# Patient Record
Sex: Female | Born: 1945
Health system: Southern US, Community
[De-identification: ages and names within clinical notes are randomized; demographics above are authoritative.]

## PROBLEM LIST (undated history)

## (undated) DIAGNOSIS — I10 Essential (primary) hypertension: Secondary | ICD-10-CM

## (undated) DIAGNOSIS — K219 Gastro-esophageal reflux disease without esophagitis: Secondary | ICD-10-CM

## (undated) DIAGNOSIS — E7800 Pure hypercholesterolemia, unspecified: Secondary | ICD-10-CM

## (undated) DIAGNOSIS — E079 Disorder of thyroid, unspecified: Secondary | ICD-10-CM

## (undated) DIAGNOSIS — E039 Hypothyroidism, unspecified: Secondary | ICD-10-CM

## (undated) HISTORY — PX: TUBAL LIGATION: SHX77

## (undated) HISTORY — PX: HEMORROIDECTOMY: SUR656

---

## 2004-07-28 ENCOUNTER — Ambulatory Visit (HOSPITAL_COMMUNITY): Admission: RE | Admit: 2004-07-28 | Discharge: 2004-07-28 | Payer: Self-pay | Admitting: Internal Medicine

## 2004-07-28 ENCOUNTER — Ambulatory Visit: Payer: Self-pay | Admitting: Internal Medicine

## 2004-07-28 HISTORY — PX: COLONOSCOPY: SHX174

## 2009-04-02 HISTORY — PX: COLONOSCOPY: SHX174

## 2010-01-12 ENCOUNTER — Telehealth (INDEPENDENT_AMBULATORY_CARE_PROVIDER_SITE_OTHER): Payer: Self-pay | Admitting: *Deleted

## 2010-01-13 ENCOUNTER — Encounter: Payer: Self-pay | Admitting: Internal Medicine

## 2010-01-23 ENCOUNTER — Ambulatory Visit: Payer: Self-pay | Admitting: Internal Medicine

## 2010-01-23 ENCOUNTER — Ambulatory Visit (HOSPITAL_COMMUNITY): Admission: RE | Admit: 2010-01-23 | Discharge: 2010-01-23 | Payer: Self-pay | Admitting: Internal Medicine

## 2010-05-02 NOTE — Progress Notes (Signed)
  Phone Note Call from Patient   Reason for Call: Talk to Nurse Summary of Call: pt had LMOM that she was returning a phone call from earlier. you can reach her at [phone] Initial call taken by: Diana Eves,  January 12, 2010 1:22 PM     Appended Document:  Reviewed meds with pt. No new meds and no new problems. pt is scheduled for 01/23/2010 for  her colonoscopy.

## 2010-05-02 NOTE — Letter (Signed)
Summary: TRIAGE ORDER  TRIAGE ORDER   Imported By: Ave Filter 01/13/2010 10:55:05  _____________________________________________________________________  External Attachment:    Type:   Image     Comment:   External Document

## 2010-08-18 NOTE — Op Note (Signed)
NAMEADALY, Tabitha Fields           ACCOUNT NO.:  [id number]   MEDICAL RECORD NO.:  [id number]          PATIENT TYPE:  AMB   LOCATION:  DAY                           FACILITY:  [facility]   PHYSICIAN:  R. Roetta Sessions, M.D. DATE OF BIRTH:  04-22-1945   DATE OF PROCEDURE:  07/28/2004  DATE OF DISCHARGE:                                 OPERATIVE REPORT   PROCEDURE:  Colonoscopy with biopsy.   INDICATIONS FOR PROCEDURE:  The patient is a 65 year old lady referred at  the courtesy of Dr. Ernestina Penna in [place] for colorectal cancer screening. She  is devoid of any lower GI tract symptoms. She has never had a colonoscopy,  and there is no family history of colon cancer. Colonoscopy is now being  done as a screening maneuver. This approach has been discussed with the  patient at length. Potential risks, benefits, and alternatives have been  reviewed and questions answered. She is agreeable. Please see documentation  in the medical record.   PROCEDURE NOTE:  O2 saturation, blood pressure, pulse, and respirations were  monitored throughout the entire procedure. Conscious sedation with Versed 2  mg IV and Demerol 50 mg IV.   INSTRUMENT:  Olympus video chip system.   FINDINGS:  Digital exam revealed no abnormalities.   ENDOSCOPIC FINDINGS:  Prep was adequate.   Rectum:  Examination of the rectal mucosa including retroflexed view of the  anal verge revealed no abnormalities.   Colon:  Colonic mucosa was surveyed from the rectosigmoid junction through  the left, transverse, and right colon to the area of the appendiceal  orifice, ileocecal valve, and cecum. These structures were well seen and  photographed for the record. Olympus videoscope was slowly withdrawn. All  previously mentioned mucosal surfaces were again. The patient was noted to  have a diminutive 4-mm polyp on a fold at the splenic flexure. It was cold  biopsied/removed. The remainder of the colonic mucosa appeared normal. The  patient tolerated the procedure well and was reactive to endoscopy.   IMPRESSION:  Normal rectum. Diminutive polyp at the splenic flexure, cold  biopsied/removed. The remainder of the colonic mucosa appeared normal.   RECOMMENDATIONS:  1.  Followup on pathology.  2.  Further recommendations to follow.      RMR/MEDQ  D:  07/28/2004  T:  07/28/2004  Job:  [id number]

## 2010-09-27 ENCOUNTER — Other Ambulatory Visit (HOSPITAL_COMMUNITY): Payer: Self-pay | Admitting: Internal Medicine

## 2010-09-27 DIAGNOSIS — Z139 Encounter for screening, unspecified: Secondary | ICD-10-CM

## 2010-10-02 ENCOUNTER — Ambulatory Visit (HOSPITAL_COMMUNITY)
Admission: RE | Admit: 2010-10-02 | Discharge: 2010-10-02 | Disposition: A | Discharge status: Home / Self Care | Payer: Medicare Other | Source: Ambulatory Visit | Attending: Internal Medicine | Admitting: Internal Medicine

## 2010-10-02 DIAGNOSIS — Z1231 Encounter for screening mammogram for malignant neoplasm of breast: Secondary | ICD-10-CM | POA: Insufficient documentation

## 2010-10-02 DIAGNOSIS — Z139 Encounter for screening, unspecified: Secondary | ICD-10-CM

## 2010-10-11 ENCOUNTER — Other Ambulatory Visit: Payer: Self-pay | Admitting: Internal Medicine

## 2010-10-11 DIAGNOSIS — R928 Other abnormal and inconclusive findings on diagnostic imaging of breast: Secondary | ICD-10-CM

## 2010-10-25 ENCOUNTER — Ambulatory Visit (HOSPITAL_COMMUNITY)
Admission: RE | Admit: 2010-10-25 | Discharge: 2010-10-25 | Disposition: A | Discharge status: Home / Self Care | Payer: Medicare Other | Source: Ambulatory Visit | Attending: Internal Medicine | Admitting: Internal Medicine

## 2010-10-25 ENCOUNTER — Other Ambulatory Visit (HOSPITAL_COMMUNITY): Payer: Self-pay | Admitting: Internal Medicine

## 2010-10-25 DIAGNOSIS — R928 Other abnormal and inconclusive findings on diagnostic imaging of breast: Secondary | ICD-10-CM

## 2011-06-27 ENCOUNTER — Other Ambulatory Visit (HOSPITAL_COMMUNITY): Payer: Self-pay | Admitting: Family Medicine

## 2011-06-27 DIAGNOSIS — Z139 Encounter for screening, unspecified: Secondary | ICD-10-CM

## 2011-07-03 ENCOUNTER — Other Ambulatory Visit (HOSPITAL_COMMUNITY): Payer: Medicare Other

## 2011-07-12 ENCOUNTER — Other Ambulatory Visit (HOSPITAL_COMMUNITY): Payer: Medicare Other

## 2011-07-12 ENCOUNTER — Other Ambulatory Visit (HOSPITAL_COMMUNITY): Payer: Self-pay | Admitting: Family Medicine

## 2011-07-12 DIAGNOSIS — Z139 Encounter for screening, unspecified: Secondary | ICD-10-CM

## 2011-07-13 ENCOUNTER — Ambulatory Visit (HOSPITAL_COMMUNITY)
Admission: RE | Admit: 2011-07-13 | Discharge: 2011-07-13 | Disposition: A | Discharge status: Home / Self Care | Payer: Medicare Other | Source: Ambulatory Visit | Attending: Family Medicine | Admitting: Family Medicine

## 2011-07-13 DIAGNOSIS — Z13820 Encounter for screening for osteoporosis: Secondary | ICD-10-CM | POA: Insufficient documentation

## 2011-07-13 DIAGNOSIS — M899 Disorder of bone, unspecified: Secondary | ICD-10-CM | POA: Insufficient documentation

## 2011-07-13 DIAGNOSIS — M949 Disorder of cartilage, unspecified: Secondary | ICD-10-CM | POA: Insufficient documentation

## 2011-07-13 DIAGNOSIS — Z139 Encounter for screening, unspecified: Secondary | ICD-10-CM

## 2011-07-13 DIAGNOSIS — Z780 Asymptomatic menopausal state: Secondary | ICD-10-CM | POA: Insufficient documentation

## 2011-10-11 ENCOUNTER — Other Ambulatory Visit (HOSPITAL_COMMUNITY): Payer: Self-pay | Admitting: Family Medicine

## 2011-10-11 DIAGNOSIS — Z139 Encounter for screening, unspecified: Secondary | ICD-10-CM

## 2011-10-18 ENCOUNTER — Ambulatory Visit (HOSPITAL_COMMUNITY)
Admission: RE | Admit: 2011-10-18 | Discharge: 2011-10-18 | Disposition: A | Discharge status: Home / Self Care | Payer: Medicare Other | Source: Ambulatory Visit | Attending: Family Medicine | Admitting: Family Medicine

## 2011-10-18 DIAGNOSIS — Z1231 Encounter for screening mammogram for malignant neoplasm of breast: Secondary | ICD-10-CM | POA: Insufficient documentation

## 2011-10-18 DIAGNOSIS — Z139 Encounter for screening, unspecified: Secondary | ICD-10-CM

## 2012-08-21 ENCOUNTER — Other Ambulatory Visit (HOSPITAL_COMMUNITY): Payer: Self-pay | Admitting: Family Medicine

## 2012-08-21 DIAGNOSIS — M8580 Other specified disorders of bone density and structure, unspecified site: Secondary | ICD-10-CM

## 2012-09-01 ENCOUNTER — Ambulatory Visit (HOSPITAL_COMMUNITY)
Admission: RE | Admit: 2012-09-01 | Discharge: 2012-09-01 | Disposition: A | Discharge status: Home / Self Care | Payer: Medicare Other | Source: Ambulatory Visit | Attending: Family Medicine | Admitting: Family Medicine

## 2012-09-01 DIAGNOSIS — M8580 Other specified disorders of bone density and structure, unspecified site: Secondary | ICD-10-CM

## 2012-09-01 DIAGNOSIS — M949 Disorder of cartilage, unspecified: Secondary | ICD-10-CM | POA: Insufficient documentation

## 2012-09-01 DIAGNOSIS — M899 Disorder of bone, unspecified: Secondary | ICD-10-CM | POA: Insufficient documentation

## 2012-10-09 ENCOUNTER — Other Ambulatory Visit (HOSPITAL_COMMUNITY): Payer: Self-pay | Admitting: Family Medicine

## 2012-10-09 DIAGNOSIS — Z139 Encounter for screening, unspecified: Secondary | ICD-10-CM

## 2012-10-20 ENCOUNTER — Ambulatory Visit (HOSPITAL_COMMUNITY)
Admission: RE | Admit: 2012-10-20 | Discharge: 2012-10-20 | Disposition: A | Discharge status: Home / Self Care | Payer: Medicare Other | Source: Ambulatory Visit | Attending: Family Medicine | Admitting: Family Medicine

## 2012-10-20 DIAGNOSIS — Z139 Encounter for screening, unspecified: Secondary | ICD-10-CM

## 2012-10-20 DIAGNOSIS — Z1231 Encounter for screening mammogram for malignant neoplasm of breast: Secondary | ICD-10-CM | POA: Insufficient documentation

## 2013-10-08 ENCOUNTER — Other Ambulatory Visit (HOSPITAL_COMMUNITY): Payer: Self-pay | Admitting: Family Medicine

## 2013-10-08 DIAGNOSIS — Z1231 Encounter for screening mammogram for malignant neoplasm of breast: Secondary | ICD-10-CM

## 2013-10-22 ENCOUNTER — Ambulatory Visit (HOSPITAL_COMMUNITY): Payer: Medicare Other

## 2013-10-26 ENCOUNTER — Ambulatory Visit (HOSPITAL_COMMUNITY)
Admission: RE | Admit: 2013-10-26 | Discharge: 2013-10-26 | Disposition: A | Discharge status: Home / Self Care | Payer: Medicare Other | Source: Ambulatory Visit | Attending: Family Medicine | Admitting: Family Medicine

## 2013-10-26 DIAGNOSIS — Z1231 Encounter for screening mammogram for malignant neoplasm of breast: Secondary | ICD-10-CM | POA: Diagnosis not present

## 2014-06-09 ENCOUNTER — Emergency Department (HOSPITAL_COMMUNITY)
Admission: EM | Admit: 2014-06-09 | Discharge: 2014-06-09 | Disposition: A | Discharge status: Home / Self Care | Payer: Medicare Other | Attending: Emergency Medicine | Admitting: Emergency Medicine

## 2014-06-09 ENCOUNTER — Emergency Department (HOSPITAL_COMMUNITY): Payer: Medicare Other

## 2014-06-09 ENCOUNTER — Encounter (HOSPITAL_COMMUNITY): Payer: Self-pay

## 2014-06-09 DIAGNOSIS — Z8639 Personal history of other endocrine, nutritional and metabolic disease: Secondary | ICD-10-CM | POA: Insufficient documentation

## 2014-06-09 DIAGNOSIS — Z8719 Personal history of other diseases of the digestive system: Secondary | ICD-10-CM | POA: Diagnosis not present

## 2014-06-09 DIAGNOSIS — M1712 Unilateral primary osteoarthritis, left knee: Secondary | ICD-10-CM | POA: Insufficient documentation

## 2014-06-09 DIAGNOSIS — M25562 Pain in left knee: Secondary | ICD-10-CM | POA: Diagnosis present

## 2014-06-09 HISTORY — DX: Gastro-esophageal reflux disease without esophagitis: K21.9

## 2014-06-09 HISTORY — DX: Disorder of thyroid, unspecified: E07.9

## 2014-06-09 HISTORY — DX: Pure hypercholesterolemia, unspecified: E78.00

## 2014-06-09 MED ORDER — HYDROCODONE-ACETAMINOPHEN 5-325 MG PO TABS: ORAL_TABLET | ORAL | Status: DC

## 2014-06-09 MED ORDER — CELECOXIB 100 MG PO CAPS: 100.0000 mg | ORAL_CAPSULE | Freq: Two times a day (BID) | ORAL | Status: DC

## 2014-06-09 MED ORDER — HYDROCODONE-ACETAMINOPHEN 5-325 MG PO TABS
1.0000 | ORAL_TABLET | Freq: Once | ORAL | Status: AC
  Administered 2014-06-09: 1 via ORAL
  Filled 2014-06-09: qty 1

## 2014-06-09 MED ORDER — NAPROXEN 250 MG PO TABS
500.0000 mg | ORAL_TABLET | Freq: Once | ORAL | Status: AC
  Administered 2014-06-09: 500 mg via ORAL
  Filled 2014-06-09: qty 2

## 2014-06-09 NOTE — ED Notes (Addendum)
PA at bedside.

## 2014-06-09 NOTE — Discharge Instructions (Signed)
Your xray reveals extensive degenerative changes (arthritis) of the left knee. Please call Dr Romeo AppleHarrison for appointment in the office to discuss this. Please use celebrex 2 times daily it a meal. Use norco at bed time, or every 6 hours if need for pain. This medication may cause drowsiness and lightheadedness, please use with caution. Please use a walker when up and about until seen by orthopedic MD. Heating pad may be helpful. Arthritis, Nonspecific Arthritis is pain, redness, warmth, or puffiness (inflammation) of a joint. The joint may be stiff or hurt when you move it. One or more joints may be affected. There are many types of arthritis. Your doctor may not know what type you have right away. The most common cause of arthritis is wear and tear on the joint (osteoarthritis). HOME CARE   Only take medicine as told by your doctor.  Rest the joint as much as possible.  Raise (elevate) your joint if it is puffy.  Use crutches if the painful joint is in your leg.  Drink enough fluids to keep your pee (urine) clear or pale yellow.  Follow your doctor's diet instructions.  Use cold packs for very bad joint pain for 10 to 15 minutes every hour. Ask your doctor if it is okay for you to use hot packs.  Exercise as told by your doctor.  Take a warm shower if you have stiffness in the morning.  Move your sore joints throughout the day. GET HELP RIGHT AWAY IF:   You have a fever.  You have very bad joint pain, puffiness, or redness.  You have many joints that are painful and puffy.  You are not getting better with treatment.  You have very bad back pain or leg weakness.  You cannot control when you poop (bowel movement) or pee (urinate).  You do not feel better in 24 hours or are getting worse.  You are having side effects from your medicine. MAKE SURE YOU:   Understand these instructions.  Will watch your condition.  Will get help right away if you are not doing well or get  worse. Document Released: 06/13/2009 Document Revised: 09/18/2011 Document Reviewed: 06/13/2009 [facility] Patient Information 2015 [place], [place]. This information is not intended to replace advice given to you by your health care provider. Make sure you discuss any questions you have with your health care provider.

## 2014-06-09 NOTE — ED Notes (Signed)
Left knee pain that began yesterday. Denies any injury. Describes pain as " bone grinding against bone" difficult to ambulate.

## 2014-06-09 NOTE — ED Provider Notes (Signed)
CSN: [id number]     Arrival date & time 06/09/14  0807 History   First MD Initiated Contact with Patient 06/09/14 [phone]     Chief Complaint  Patient presents with  . Knee Pain     (Consider location/radiation/quality/duration/timing/severity/associated sxs/prior Treatment) Patient is a 69 y.o. female presenting with knee pain. The history is provided by the patient.  Knee Pain Location:  Knee Time since incident:  2 days Injury: no   Knee location:  L knee Pain details:    Quality:  Aching (grinding pain)   Radiates to:  Does not radiate   Severity:  Moderate   Onset quality:  Gradual   Timing:  Intermittent   Progression:  Worsening Chronicity: acute on chronic pain. Dislocation: no   Foreign body present:  No foreign bodies Prior injury to area:  No Relieved by:  Nothing Worsened by:  Bearing weight and extension Ineffective treatments:  Acetaminophen Associated symptoms: stiffness   Associated symptoms: no back pain, no fever, no muscle weakness, no neck pain, no numbness and no tingling   Risk factors: no frequent fractures     Past Medical History  Diagnosis Date  . Hypercholesteremia   . Thyroid disease   . GERD (gastroesophageal reflux disease)    Past Surgical History  Procedure Laterality Date  . Hemorroidectomy     No family history on file. History  Substance Use Topics  . Smoking status: Never Smoker   . Smokeless tobacco: Not on file  . Alcohol Use: No   OB History    No data available     Review of Systems  Constitutional: Negative for fever and activity change.       All ROS Neg except as noted in HPI  HENT: Negative.   Eyes: Negative for photophobia and discharge.  Respiratory: Negative for cough, shortness of breath and wheezing.   Cardiovascular: Negative for chest pain and palpitations.  Gastrointestinal: Negative for abdominal pain and blood in stool.  Genitourinary: Negative for dysuria, frequency and hematuria.  Musculoskeletal:  Positive for stiffness. Negative for back pain, arthralgias and neck pain.  Skin: Negative.   Neurological: Negative for dizziness, seizures and speech difficulty.  Psychiatric/Behavioral: Negative for hallucinations and confusion.      Allergies  Review of patient's allergies indicates no known allergies.  Home Medications   Prior to Admission medications   Not on File   BP 179/88 mmHg  Pulse 95  Temp(Src) 98.1 F (36.7 C) (Oral)  Resp 16  Ht 5\' 2"  (1.575 m)  Wt 150 lb (68.04 kg)  BMI 27.43 kg/m2  SpO2 95% Physical Exam  Constitutional: She is oriented to person, place, and time. She appears well-developed and well-nourished.  Non-toxic appearance.  HENT:  Head: Normocephalic.  Right Ear: Tympanic membrane and external ear normal.  Left Ear: Tympanic membrane and external ear normal.  Eyes: EOM and lids are normal. Pupils are equal, round, and reactive to light.  Neck: Normal range of motion. Neck supple. Carotid bruit is not present.  Cardiovascular: Normal rate, regular rhythm, normal heart sounds, intact distal pulses and normal pulses.   Pulmonary/Chest: Breath sounds normal. No respiratory distress.  Abdominal: Soft. Bowel sounds are normal. There is no tenderness. There is no guarding.  Musculoskeletal: Normal range of motion.  Lymphadenopathy:       Head (right side): No submandibular adenopathy present.       Head (left side): No submandibular adenopathy present.    She has no cervical adenopathy.  Neurological: She is alert and oriented to person, place, and time. She has normal strength. No cranial nerve deficit or sensory deficit.  Skin: Skin is warm and dry.  Psychiatric: She has a normal mood and affect. Her speech is normal.  Nursing note and vitals reviewed.   ED Course  Procedures (including critical care time) Labs Review Labs Reviewed - No data to display  Imaging Review No results found.   EKG Interpretation None      MDM Xray reveals  multiple areas of arthritis of the left knee. No hot joints. Pt to use a walker until seen by orthopedic MD. Pt to Use celebrex and norco for pain.   Final diagnoses:  Primary osteoarthritis of left knee    **I have reviewed nursing notes, vital signs, and all appropriate lab and imaging results for this patient.Ivery Quale, PA-C 06/09/14 1038  Bethann Berkshire, MD 06/09/14 [phone]

## 2014-06-14 ENCOUNTER — Encounter: Payer: Self-pay | Admitting: Orthopedic Surgery

## 2014-06-14 ENCOUNTER — Ambulatory Visit (INDEPENDENT_AMBULATORY_CARE_PROVIDER_SITE_OTHER): Payer: Medicare Other | Admitting: Orthopedic Surgery

## 2014-06-14 DIAGNOSIS — S83242A Other tear of medial meniscus, current injury, left knee, initial encounter: Secondary | ICD-10-CM

## 2014-06-14 MED ORDER — CELECOXIB 100 MG PO CAPS: 100.0000 mg | ORAL_CAPSULE | Freq: Two times a day (BID) | ORAL | Status: DC

## 2014-06-14 MED ORDER — HYDROCODONE-ACETAMINOPHEN 5-325 MG PO TABS: ORAL_TABLET | ORAL | Status: DC

## 2014-06-14 NOTE — Patient Instructions (Signed)
We will schedule MRI for you 

## 2014-06-14 NOTE — Progress Notes (Signed)
Patient ID: Tabitha Fields, female   DOB: 29-Nov-1945, 69 y.o.   MRN: [id number]  Chief Complaint  Patient presents with  . Knee Pain    er follow up Left knee pain, no known injury x 7 days      Tabitha Fields is a 69 y.o. female.   HPI 69 year old active female presents with a seven-day history of acute onset medial knee pain requiring her to go to the emergency room. She reports anterior and medial knee pain aching sharp at times and initially. The pain continues and is constant and is worse with attempts at weightbearing. The patient has no confidence in her left knee and feels like it is giving out on her when she tries to put pressure on it. She also has some swelling and grinding sensation.  She denies neurologic symptoms or warmth of the joint Review of Systems As above  Past Medical History  Diagnosis Date  . Hypercholesteremia   . Thyroid disease   . GERD (gastroesophageal reflux disease)     Past Surgical History  Procedure Laterality Date  . Hemorroidectomy      History reviewed. No pertinent family history.  Social History History  Substance Use Topics  . Smoking status: Never Smoker   . Smokeless tobacco: Not on file  . Alcohol Use: No    No Known Allergies  Current Outpatient Prescriptions  Medication Sig Dispense Refill  . celecoxib (CELEBREX) 100 MG capsule Take 1 capsule (100 mg total) by mouth 2 (two) times daily. 60 capsule 0  . HYDROcodone-acetaminophen (NORCO/VICODIN) 5-325 MG per tablet One at hs, or q6h prn pain 60 tablet 0   No current facility-administered medications for this visit.       Physical Exam Blood pressure 156/93, height 5\' 2"  (1.575 m), weight 150 lb (68.04 kg). Physical Exam The patient is well developed well nourished and well groomed. Orientation to person place and time is normal  Mood is pleasant. Ambulatory status she is requiring a walker and she is limping she is favoring the involved left knee. Right knee  examination inspection and palpation revealed no abnormalities, she has full range of motion without pain. The knee is stable and the strength in the right leg is normal.  The left knee does not have effusion but is noted to be tender over the medial joint line and she does have painful range of motion at 120 of flexion up to 135 of flexion, no instability is detected. Muscle tone and strength are normal and the quadriceps. She has a positive McMurray sign and a positive screw home sign for pain over the medial joint line  Skin over the lower extremities is normal. Vascular exam reveals no varicosities or swelling in either limb and she has good pulses and temperature is normal  Data Reviewed Independent interpretation of her knee x-ray shows that she does have 50% loss of joint space on the medial side consistent with osteoarthritis but does not explain her symptoms  Assessment Agnosias includes acute stress fracture versus AVN of the medial femoral condyle or medial tibial plateau, stress reaction related to degenerative arthritis involving the medial compartment, medial meniscal tear, acute exacerbation of arthritis   Plan She did get relief from Celebrex and hydrocodone we will continue Because for mechanical symptoms recommend MRI to assess the need for surgical intervention Hinged knee brace wrap on style.

## 2014-06-24 ENCOUNTER — Ambulatory Visit (HOSPITAL_COMMUNITY)
Admission: RE | Admit: 2014-06-24 | Discharge: 2014-06-24 | Disposition: A | Discharge status: Home / Self Care | Payer: Medicare Other | Source: Ambulatory Visit | Attending: Orthopedic Surgery | Admitting: Orthopedic Surgery

## 2014-06-24 DIAGNOSIS — M1712 Unilateral primary osteoarthritis, left knee: Secondary | ICD-10-CM | POA: Diagnosis not present

## 2014-06-24 DIAGNOSIS — X58XXXA Exposure to other specified factors, initial encounter: Secondary | ICD-10-CM | POA: Insufficient documentation

## 2014-06-24 DIAGNOSIS — S83242A Other tear of medial meniscus, current injury, left knee, initial encounter: Secondary | ICD-10-CM | POA: Insufficient documentation

## 2014-07-02 ENCOUNTER — Telehealth: Payer: Self-pay | Admitting: Orthopedic Surgery

## 2014-07-02 NOTE — Telephone Encounter (Signed)
Patient called to follow up regarding MRI results, which she had at [facility].  Relayed to patient that Dr Romeo AppleHarrison has been out of office since that time and will contact her upon his return to office. Patient ph# [phone]

## 2014-07-14 ENCOUNTER — Telehealth: Payer: Self-pay | Admitting: *Deleted

## 2014-07-14 NOTE — Telephone Encounter (Signed)
Requesting refill on Celebrex

## 2014-07-15 ENCOUNTER — Other Ambulatory Visit: Payer: Self-pay | Admitting: *Deleted

## 2014-07-15 MED ORDER — CELECOXIB 100 MG PO CAPS: 100.0000 mg | ORAL_CAPSULE | Freq: Two times a day (BID) | ORAL | Status: DC

## 2014-07-15 NOTE — Telephone Encounter (Signed)
refill 

## 2014-07-15 NOTE — Telephone Encounter (Signed)
done

## 2014-08-02 ENCOUNTER — Encounter: Payer: Self-pay | Admitting: Orthopedic Surgery

## 2014-08-02 ENCOUNTER — Ambulatory Visit (INDEPENDENT_AMBULATORY_CARE_PROVIDER_SITE_OTHER): Payer: Medicare Other | Admitting: Orthopedic Surgery

## 2014-08-02 VITALS — BP 169/95 | Ht 62.0 in | Wt 150.0 lb

## 2014-08-02 DIAGNOSIS — M1712 Unilateral primary osteoarthritis, left knee: Secondary | ICD-10-CM

## 2014-08-02 NOTE — Progress Notes (Signed)
Follow-up visit for injection  Chief Complaint  Patient presents with  . Injections    monovisc #1 left knee    The patient been evaluated and found to have osteoarthritis of the left knee and has decided to go with monovisc  Site confirmation confirmed verbal consent given. Left knee site of injection. We injected 1 vial of monovisc into the left knee. We used alcohol for skin prep and ethyl chloride for anesthesia. The patient tolerated the procedure well without complication will follow-up in 6 week to check on her progress

## 2014-08-02 NOTE — Patient Instructions (Signed)
Apply ice as needed  You may have some pain and swelling for 48 hrs  Tale 2 tylenol every 8 hours if you do

## 2014-09-13 ENCOUNTER — Ambulatory Visit (INDEPENDENT_AMBULATORY_CARE_PROVIDER_SITE_OTHER): Payer: Medicare Other | Admitting: Orthopedic Surgery

## 2014-09-13 VITALS — BP 172/99 | Ht 62.0 in | Wt 151.4 lb

## 2014-09-13 DIAGNOSIS — M1712 Unilateral primary osteoarthritis, left knee: Secondary | ICD-10-CM

## 2014-09-13 NOTE — Progress Notes (Signed)
Follow-up visit status post monovisc injection. Patient has left knee osteoarthritis. She's had an x-ray which shows 50% loss of joint space. She is on intermittent Celebrex and is also been on hydrocodone. She had an MRI which showed osteoarthritis but no tear. The mono vascular has a improved her symptoms significantly. She has no limp  Review of systems no catching locking or giving way  Examination shows stable vital signs. She is awake alert and oriented 3 her overall appearance is normal her body habitus is mesomorphic.  She has no swelling in the knee her knee extension is 0 her flexion is 120 and her knee is stable. Motor exam is normal and quadriceps muscle tone is normal.  Stable osteoarthritis of the left knee controlled with intermittent Celebrex  Follow-up when symptomatic.

## 2014-10-01 ENCOUNTER — Other Ambulatory Visit (HOSPITAL_COMMUNITY): Payer: Self-pay | Admitting: Family Medicine

## 2014-10-01 DIAGNOSIS — Z1231 Encounter for screening mammogram for malignant neoplasm of breast: Secondary | ICD-10-CM

## 2014-10-07 ENCOUNTER — Other Ambulatory Visit (HOSPITAL_COMMUNITY): Payer: Self-pay | Admitting: Family Medicine

## 2014-10-07 ENCOUNTER — Ambulatory Visit (HOSPITAL_COMMUNITY)
Admission: RE | Admit: 2014-10-07 | Discharge: 2014-10-07 | Disposition: A | Discharge status: Home / Self Care | Payer: Medicare Other | Source: Ambulatory Visit | Attending: Family Medicine | Admitting: Family Medicine

## 2014-10-07 DIAGNOSIS — R221 Localized swelling, mass and lump, neck: Principal | ICD-10-CM

## 2014-10-07 DIAGNOSIS — R220 Localized swelling, mass and lump, head: Secondary | ICD-10-CM

## 2014-10-07 DIAGNOSIS — Z1231 Encounter for screening mammogram for malignant neoplasm of breast: Secondary | ICD-10-CM

## 2014-10-18 ENCOUNTER — Other Ambulatory Visit (HOSPITAL_COMMUNITY): Payer: Medicare Other

## 2014-10-20 ENCOUNTER — Ambulatory Visit (HOSPITAL_COMMUNITY)
Admission: RE | Admit: 2014-10-20 | Discharge: 2014-10-20 | Disposition: A | Discharge status: Home / Self Care | Payer: Medicare Other | Source: Ambulatory Visit | Attending: Family Medicine | Admitting: Family Medicine

## 2014-10-20 DIAGNOSIS — R1310 Dysphagia, unspecified: Secondary | ICD-10-CM | POA: Insufficient documentation

## 2014-10-20 DIAGNOSIS — R05 Cough: Secondary | ICD-10-CM | POA: Insufficient documentation

## 2014-10-20 DIAGNOSIS — R221 Localized swelling, mass and lump, neck: Secondary | ICD-10-CM | POA: Diagnosis not present

## 2014-10-20 DIAGNOSIS — R220 Localized swelling, mass and lump, head: Secondary | ICD-10-CM

## 2014-10-29 ENCOUNTER — Ambulatory Visit (HOSPITAL_COMMUNITY)
Admission: RE | Admit: 2014-10-29 | Discharge: 2014-10-29 | Disposition: A | Discharge status: Home / Self Care | Payer: Medicare Other | Source: Ambulatory Visit | Attending: Family Medicine | Admitting: Family Medicine

## 2014-10-29 DIAGNOSIS — Z1231 Encounter for screening mammogram for malignant neoplasm of breast: Secondary | ICD-10-CM | POA: Diagnosis present

## 2014-12-20 ENCOUNTER — Encounter: Payer: Self-pay | Admitting: Internal Medicine

## 2015-03-04 ENCOUNTER — Ambulatory Visit (INDEPENDENT_AMBULATORY_CARE_PROVIDER_SITE_OTHER): Payer: Medicare Other | Admitting: Gastroenterology

## 2015-03-04 ENCOUNTER — Encounter: Payer: Self-pay | Admitting: Gastroenterology

## 2015-03-04 ENCOUNTER — Other Ambulatory Visit: Payer: Self-pay

## 2015-03-04 VITALS — BP 186/86 | HR 76 | Temp 98.4°F | Ht 62.0 in | Wt 152.6 lb

## 2015-03-04 DIAGNOSIS — Z86010 Personal history of colonic polyps: Secondary | ICD-10-CM

## 2015-03-04 MED ORDER — PEG 3350-KCL-NA BICARB-NACL 420 G PO SOLR: 4000.0000 mL | Freq: Once | ORAL | Status: DC

## 2015-03-04 NOTE — Patient Instructions (Signed)
We have scheduled you for a colonoscopy with Dr. Rourk in the near future!  Have a great Christmas!   

## 2015-03-13 ENCOUNTER — Encounter: Payer: Self-pay | Admitting: Gastroenterology

## 2015-03-13 NOTE — Assessment & Plan Note (Signed)
69 year old female with history of colonic adenoma in the remote past, due for 5 year surveillance now. She has no concerning lower or upper GI symptoms.   Proceed with TCS with Dr. Jena Gaussourk in near future: the risks, benefits, and alternatives have been discussed with the patient in detail. The patient states understanding and desires to proceed.

## 2015-03-13 NOTE — Progress Notes (Signed)
  Primary Care Physician:  JACKSON,SAMANTHA, PA-C Primary Gastroenterologist:  Dr. Rourk   Chief Complaint  Patient presents with  . Colonoscopy    HPI:   Tabitha Fields is a 69 y.o. female presenting today with history of colonic adenomas in the past, last colonoscopy in 2011 without polyps, now with need for 5 year surveillance.   Denies abdominal pain, changes in bowel habits, or evidence of rectal bleeding. Takes Nexium for GERD, which controls symptoms. No dysphagia. No GI complaints today.   Past Medical History  Diagnosis Date  . Hypercholesteremia   . Thyroid disease   . GERD (gastroesophageal reflux disease)     Past Surgical History  Procedure Laterality Date  . Hemorroidectomy    . Colonoscopy  07/28/04    Dr. Rourkadenoma  . Colonoscopy  2011    Dr. Rourk: anal canal hemorhoids, pancolonic diverticula, otherwise normal  . Tubal ligation      Current Outpatient Prescriptions  Medication Sig Dispense Refill  . calcium carbonate 1250 MG capsule Take 1,250 mg by mouth 2 (two) times daily with a meal.    . Coenzyme Q10 (CO Q 10) 100 MG CAPS Take 200 mg by mouth daily.    . Esomeprazole Magnesium (NEXIUM PO) Take 22.3 mg by mouth daily.     . GREEN TEA, CAMILLIA SINENSIS, PO Take 730 mg by mouth daily.    . LEVOTHYROXINE SODIUM PO Take 75 mcg by mouth daily.     . Multiple Vitamin (MULTIVITAMIN) capsule Take 1 capsule by mouth daily.    . Red Yeast Rice 600 MG CAPS Take 600 mg by mouth daily.    . SIMVASTATIN PO Take 25 mg by mouth daily.     . celecoxib (CELEBREX) 100 MG capsule Take 1 capsule (100 mg total) by mouth 2 (two) times daily. (Patient not taking: Reported on 03/04/2015) 60 capsule 5  . polyethylene glycol-electrolytes (NULYTELY/GOLYTELY) 420 G solution Take 4,000 mLs by mouth once. 4000 mL 0   No current facility-administered medications for this visit.    Allergies as of 03/04/2015  . (No Known Allergies)    Family History  Problem  Relation Age of Onset  . Colon cancer Neg Hx     Social History   Social History  . Marital Status: Married    Spouse Name: N/A  . Number of Children: N/A  . Years of Education: N/A   Occupational History  . Not on file.   Social History Main Topics  . Smoking status: Never Smoker   . Smokeless tobacco: Not on file  . Alcohol Use: No  . Drug Use: Not on file  . Sexual Activity: Not on file   Other Topics Concern  . Not on file   Social History Narrative    Review of Systems: Gen: Denies any fever, chills, fatigue, weight loss, lack of appetite.  CV: Denies chest pain, heart palpitations, peripheral edema, syncope.  Resp: Denies shortness of breath at rest or with exertion. Denies wheezing or cough.  GI: Denies dysphagia or odynophagia. Denies jaundice, hematemesis, fecal incontinence. GU : Denies urinary burning, urinary frequency, urinary hesitancy MS: +joint pain  Derm: Denies rash, itching, dry skin Psych: Denies depression, anxiety, memory loss, and confusion Heme: Denies bruising, bleeding, and enlarged lymph nodes.  Physical Exam: BP 186/86 mmHg  Pulse 76  Temp(Src) 98.4 F (36.9 C) (Oral)  Ht 5' 2" (1.575 m)  Wt 152 lb 9.6 oz (69.219 kg)  BMI 27.90 kg/m2 General:     Alert and oriented. Pleasant and cooperative. Well-nourished and well-developed.  Head:  Normocephalic and atraumatic. Eyes:  Without icterus, sclera clear and conjunctiva pink.  Ears:  Normal auditory acuity. Nose:  No deformity, discharge,  or lesions. Mouth:  No deformity or lesions, oral mucosa pink.  Lungs:  Clear to auscultation bilaterally. No wheezes, rales, or rhonchi. No distress.  Heart:  S1, S2 present without murmurs appreciated.  Abdomen:  +BS, soft, non-tender and non-distended. No HSM noted. No guarding or rebound. No masses appreciated.  Rectal:  Deferred  Msk:  Symmetrical without gross deformities. Normal posture. Extremities:  Without edema. Neurologic:  Alert and   oriented x4;  grossly normal neurologically. Psych:  Alert and cooperative. Normal mood and affect.

## 2015-03-14 NOTE — Progress Notes (Signed)
CC'D TO PCP °

## 2015-03-17 ENCOUNTER — Ambulatory Visit (HOSPITAL_COMMUNITY)
Admission: RE | Admit: 2015-03-17 | Discharge: 2015-03-17 | Disposition: A | Discharge status: Home / Self Care | Payer: Medicare Other | Source: Ambulatory Visit | Attending: Internal Medicine | Admitting: Internal Medicine

## 2015-03-17 ENCOUNTER — Encounter (HOSPITAL_COMMUNITY): Payer: Self-pay

## 2015-03-17 ENCOUNTER — Encounter (HOSPITAL_COMMUNITY)
Admission: RE | Disposition: A | Discharge status: Home / Self Care | Payer: Self-pay | Source: Ambulatory Visit | Attending: Internal Medicine

## 2015-03-17 DIAGNOSIS — Z79899 Other long term (current) drug therapy: Secondary | ICD-10-CM | POA: Insufficient documentation

## 2015-03-17 DIAGNOSIS — Z9851 Tubal ligation status: Secondary | ICD-10-CM | POA: Diagnosis not present

## 2015-03-17 DIAGNOSIS — K5730 Diverticulosis of large intestine without perforation or abscess without bleeding: Secondary | ICD-10-CM | POA: Insufficient documentation

## 2015-03-17 DIAGNOSIS — Z1211 Encounter for screening for malignant neoplasm of colon: Secondary | ICD-10-CM | POA: Insufficient documentation

## 2015-03-17 DIAGNOSIS — Z86010 Personal history of colonic polyps: Secondary | ICD-10-CM | POA: Diagnosis not present

## 2015-03-17 DIAGNOSIS — E7800 Pure hypercholesterolemia, unspecified: Secondary | ICD-10-CM | POA: Insufficient documentation

## 2015-03-17 DIAGNOSIS — E079 Disorder of thyroid, unspecified: Secondary | ICD-10-CM | POA: Insufficient documentation

## 2015-03-17 DIAGNOSIS — K219 Gastro-esophageal reflux disease without esophagitis: Secondary | ICD-10-CM | POA: Diagnosis not present

## 2015-03-17 HISTORY — PX: COLONOSCOPY: SHX5424

## 2015-03-17 SURGERY — COLONOSCOPY
Anesthesia: Moderate Sedation

## 2015-03-17 MED ORDER — MIDAZOLAM HCL 5 MG/5ML IJ SOLN
INTRAMUSCULAR | Status: DC | PRN
  Administered 2015-03-17: 1 mg via INTRAVENOUS
  Administered 2015-03-17: 2 mg via INTRAVENOUS

## 2015-03-17 MED ORDER — ONDANSETRON HCL 4 MG/2ML IJ SOLN
INTRAMUSCULAR | Status: DC | PRN
  Administered 2015-03-17: 4 mg via INTRAVENOUS

## 2015-03-17 MED ORDER — STERILE WATER FOR IRRIGATION IR SOLN
Status: DC | PRN
  Administered 2015-03-17: 12:00:00

## 2015-03-17 MED ORDER — SODIUM CHLORIDE 0.9 % IV SOLN
INTRAVENOUS | Status: DC
  Administered 2015-03-17: 11:00:00 via INTRAVENOUS

## 2015-03-17 MED ORDER — MEPERIDINE HCL 100 MG/ML IJ SOLN
INTRAMUSCULAR | Status: DC | PRN
  Administered 2015-03-17: 50 mg via INTRAVENOUS

## 2015-03-17 MED ORDER — MIDAZOLAM HCL 5 MG/5ML IJ SOLN
INTRAMUSCULAR | Status: AC
  Filled 2015-03-17: qty 10

## 2015-03-17 MED ORDER — ONDANSETRON HCL 4 MG/2ML IJ SOLN
INTRAMUSCULAR | Status: DC
Start: 2015-03-17 — End: 2015-03-17
  Filled 2015-03-17: qty 2

## 2015-03-17 MED ORDER — MEPERIDINE HCL 100 MG/ML IJ SOLN
INTRAMUSCULAR | Status: DC
Start: 2015-03-17 — End: 2015-03-17
  Filled 2015-03-17: qty 2

## 2015-03-17 NOTE — H&P (View-Only) (Signed)
Primary Care Physician:  Pershing Proud Primary Gastroenterologist:  Dr. Jena Gauss   Chief Complaint  Patient presents with  . Colonoscopy    HPI:   Tabitha Fields is a 69 y.o. female presenting today with history of colonic adenomas in the past, last colonoscopy in 2011 without polyps, now with need for 5 year surveillance.   Denies abdominal pain, changes in bowel habits, or evidence of rectal bleeding. Takes Nexium for GERD, which controls symptoms. No dysphagia. No GI complaints today.   Past Medical History  Diagnosis Date  . Hypercholesteremia   . Thyroid disease   . GERD (gastroesophageal reflux disease)     Past Surgical History  Procedure Laterality Date  . Hemorroidectomy    . Colonoscopy  07/28/04    Dr. Betha Loa  . Colonoscopy  2011    Dr. Jena Gauss: anal canal hemorhoids, pancolonic diverticula, otherwise normal  . Tubal ligation      Current Outpatient Prescriptions  Medication Sig Dispense Refill  . calcium carbonate 1250 MG capsule Take 1,250 mg by mouth 2 (two) times daily with a meal.    . Coenzyme Q10 (CO Q 10) 100 MG CAPS Take 200 mg by mouth daily.    . Esomeprazole Magnesium (NEXIUM PO) Take 22.3 mg by mouth daily.     Marland Kitchen GREEN TEA, CAMILLIA SINENSIS, PO Take 730 mg by mouth daily.    Marland Kitchen LEVOTHYROXINE SODIUM PO Take 75 mcg by mouth daily.     . Multiple Vitamin (MULTIVITAMIN) capsule Take 1 capsule by mouth daily.    . Red Yeast Rice 600 MG CAPS Take 600 mg by mouth daily.    Marland Kitchen SIMVASTATIN PO Take 25 mg by mouth daily.     . celecoxib (CELEBREX) 100 MG capsule Take 1 capsule (100 mg total) by mouth 2 (two) times daily. (Patient not taking: Reported on 03/04/2015) 60 capsule 5  . polyethylene glycol-electrolytes (NULYTELY/GOLYTELY) 420 G solution Take 4,000 mLs by mouth once. 4000 mL 0   No current facility-administered medications for this visit.    Allergies as of 03/04/2015  . (No Known Allergies)    Family History  Problem  Relation Age of Onset  . Colon cancer Neg Hx     Social History   Social History  . Marital Status: Married    Spouse Name: N/A  . Number of Children: N/A  . Years of Education: N/A   Occupational History  . Not on file.   Social History Main Topics  . Smoking status: Never Smoker   . Smokeless tobacco: Not on file  . Alcohol Use: No  . Drug Use: Not on file  . Sexual Activity: Not on file   Other Topics Concern  . Not on file   Social History Narrative    Review of Systems: Gen: Denies any fever, chills, fatigue, weight loss, lack of appetite.  CV: Denies chest pain, heart palpitations, peripheral edema, syncope.  Resp: Denies shortness of breath at rest or with exertion. Denies wheezing or cough.  GI: Denies dysphagia or odynophagia. Denies jaundice, hematemesis, fecal incontinence. GU : Denies urinary burning, urinary frequency, urinary hesitancy MS: +joint pain  Derm: Denies rash, itching, dry skin Psych: Denies depression, anxiety, memory loss, and confusion Heme: Denies bruising, bleeding, and enlarged lymph nodes.  Physical Exam: BP 186/86 mmHg  Pulse 76  Temp(Src) 98.4 F (36.9 C) (Oral)  Ht  (1.575 m)  Wt 152 lb 9.6 oz (69.219 kg)  BMI 27.90 kg/m2 General:  Alert and oriented. Pleasant and cooperative. Well-nourished and well-developed.  Head:  Normocephalic and atraumatic. Eyes:  Without icterus, sclera clear and conjunctiva pink.  Ears:  Normal auditory acuity. Nose:  No deformity, discharge,  or lesions. Mouth:  No deformity or lesions, oral mucosa pink.  Lungs:  Clear to auscultation bilaterally. No wheezes, rales, or rhonchi. No distress.  Heart:  S1, S2 present without murmurs appreciated.  Abdomen:  +BS, soft, non-tender and non-distended. No HSM noted. No guarding or rebound. No masses appreciated.  Rectal:  Deferred  Msk:  Symmetrical without gross deformities. Normal posture. Extremities:  Without edema. Neurologic:  Alert and   oriented x4;  grossly normal neurologically. Psych:  Alert and cooperative. Normal mood and affect.

## 2015-03-17 NOTE — Op Note (Signed)
[facility] [address] [place], [id number]   COLONOSCOPY PROCEDURE REPORT  PATIENT: Tabitha Fields, Tabitha Fields  MR#: [id number] BIRTHDATE: 10-29-1945 , 69  yrs. old GENDER: female ENDOSCOPIST: R.  Roetta SessionsMichael Ulysess Witz, MD FACP [facility] Iii[facility]ACG REFERRED FA:OZHYQMVHBY:Samantha Laurence AlyJackson, P.A. PROCEDURE DATE:  03/17/2015 PROCEDURE:   Colonoscopy, surveillance INDICATIONS:History of colonic adenoma. MEDICATIONS: Versed 3 mg IV and Demerol 50 mg IV in divided doses. Zofran 4 mg IV. ASA CLASS:       Class II  CONSENT: The risks, benefits, alternatives and imponderables including but not limited to bleeding, perforation as well as the possibility of a missed lesion have been reviewed.  The potential for biopsy, lesion removal, etc. have also been discussed. Questions have been answered.  All parties agreeable.  Please see the history and physical in the medical record for more information.  DESCRIPTION OF PROCEDURE:   After the risks benefits and alternatives of the procedure were thoroughly explained, informed consent was obtained.  The digital rectal exam revealed no abnormalities of the rectum.   The EC-[id number] ([id number]  endoscope was introduced through the anus and advanced to the   . No adverse events experienced.   The quality of the prep was adequate  The instrument was then slowly withdrawn as the colon was fully examined. Estimated blood loss is zero unless otherwise noted in this procedure report.      COLON FINDINGS: Normal-appearing rectal mucosa.  Scattered left-sided and transverse diverticula; the remainder of the colonic mucosa appeared normal.  Retroflexion was performed. .  Withdrawal time=7 minutes 0 seconds.  The scope was withdrawn and the procedure completed. COMPLICATIONS: There were no immediate complications.  ENDOSCOPIC IMPRESSION: Colonic diverticulosis  RECOMMENDATIONS: Considerable 1 more surveillance colonoscopy in 5 years if overall health permits  eSigned:   R. Roetta SessionsMichael Galya Dunnigan, MD Jerrel IvoryFACP [facility] 03/17/2015 12:08 PM   cc:  CPT CODES: ICD CODES:  The ICD and CPT codes recommended by this software are interpretations from the data that the clinical staff has captured with the software.  The verification of the translation of this report to the ICD and CPT codes and modifiers is the sole responsibility of the health care institution and practicing physician where this report was generated.  [employer] will not be held responsible for the validity of the ICD and CPT codes included on this report.  AMA assumes no liability for data contained or not contained herein. CPT is a Publishing rights managerregistered trademark of the [employer].  PATIENT NAME:  Tabitha Fields, Tabitha Fields MR#: [id number]

## 2015-03-17 NOTE — Discharge Instructions (Signed)
Colonoscopy Discharge Instructions  Read the instructions outlined below and refer to this sheet in the next few weeks. These discharge instructions provide you with general information on caring for yourself after you leave the hospital. Your doctor may also give you specific instructions. While your treatment has been planned according to the most current medical practices available, unavoidable complications occasionally occur. If you have any problems or questions after discharge, call Dr. Jena Gaussourk at [phone]. ACTIVITY  You may resume your regular activity, but move at a slower pace for the next 24 hours.   Take frequent rest periods for the next 24 hours.   Walking will help get rid of the air and reduce the bloated feeling in your belly (abdomen).   No driving for 24 hours (because of the medicine (anesthesia) used during the test).    Do not sign any important legal documents or operate any machinery for 24 hours (because of the anesthesia used during the test).  NUTRITION  Drink plenty of fluids.   You may resume your normal diet as instructed by your doctor.   Begin with a light meal and progress to your normal diet. Heavy or fried foods are harder to digest and may make you feel sick to your stomach (nauseated).   Avoid alcoholic beverages for 24 hours or as instructed.  MEDICATIONS  You may resume your normal medications unless your doctor tells you otherwise.  WHAT YOU CAN EXPECT TODAY  Some feelings of bloating in the abdomen.   Passage of more gas than usual.   Spotting of blood in your stool or on the toilet paper.  IF YOU HAD POLYPS REMOVED DURING THE COLONOSCOPY:  No aspirin products for 7 days or as instructed.   No alcohol for 7 days or as instructed.   Eat a soft diet for the next 24 hours.  FINDING OUT THE RESULTS OF YOUR TEST Not all test results are available during your visit. If your test results are not back during the visit, make an appointment  with your caregiver to find out the results. Do not assume everything is normal if you have not heard from your caregiver or the medical facility. It is important for you to follow up on all of your test results.  SEEK IMMEDIATE MEDICAL ATTENTION IF:  You have more than a spotting of blood in your stool.   Your belly is swollen (abdominal distention).   You are nauseated or vomiting.   You have a temperature over 101.   You have abdominal pain or discomfort that is severe or gets worse throughout the day.    Diverticulosis information  Considerable more screening colonoscopy in 5 years if overall health permits  Diverticulosis Diverticulosis is the condition that develops when small pouches (diverticula) form in the wall of your colon. Your colon, or large intestine, is where water is absorbed and stool is formed. The pouches form when the inside layer of your colon pushes through weak spots in the outer layers of your colon. CAUSES  No one knows exactly what causes diverticulosis. RISK FACTORS  Being older than 50. Your risk for this condition increases with age. Diverticulosis is rare in people younger than 40 years. By age 69, almost everyone has it.  Eating a low-fiber diet.  Being frequently constipated.  Being overweight.  Not getting enough exercise.  Smoking.  Taking over-the-counter pain medicines, like aspirin and ibuprofen. SYMPTOMS  Most people with diverticulosis do not have symptoms. DIAGNOSIS  Because diverticulosis  often has no symptoms, health care providers often discover the condition during an exam for other colon problems. In many cases, a health care provider will diagnose diverticulosis while using a flexible scope to examine the colon (colonoscopy). TREATMENT  If you have never developed an infection related to diverticulosis, you may not need treatment. If you have had an infection before, treatment may include:  Eating more fruits, vegetables, and  grains.  Taking a fiber supplement.  Taking a live bacteria supplement (probiotic).  Taking medicine to relax your colon. HOME CARE INSTRUCTIONS   Drink at least 6-8 glasses of water each day to prevent constipation.  Try not to strain when you have a bowel movement.  Keep all follow-up appointments. If you have had an infection before:  Increase the fiber in your diet as directed by your health care provider or dietitian.  Take a dietary fiber supplement if your health care provider approves.  Only take medicines as directed by your health care provider. SEEK MEDICAL CARE IF:   You have abdominal pain.  You have bloating.  You have cramps.  You have not gone to the bathroom in 3 days. SEEK IMMEDIATE MEDICAL CARE IF:   Your pain gets worse.  Yourbloating becomes very bad.  You have a fever or chills, and your symptoms suddenly get worse.  You begin vomiting.  You have bowel movements that are bloody or black. MAKE SURE YOU:  Understand these instructions.  Will watch your condition.  Will get help right away if you are not doing well or get worse.   This information is not intended to replace advice given to you by your health care provider. Make sure you discuss any questions you have with your health care provider.   Document Released: 12/15/2003 Document Revised: 03/24/2013 Document Reviewed: 02/11/2013 Elsevier Interactive Patient Education [employer].

## 2015-03-17 NOTE — Interval H&P Note (Signed)
History and Physical Interval Note:  03/17/2015 11:38 AM  Tabitha Fields  has presented today for surgery, with the diagnosis of history of colon polyps  The various methods of treatment have been discussed with the patient and family. After consideration of risks, benefits and other options for treatment, the patient has consented to  Procedure(s) with comments: COLONOSCOPY (N/A) - 0730 as a surgical intervention .  The patient's history has been reviewed, patient examined, no change in status, stable for surgery.  I have reviewed the patient's chart and labs.  Questions were answered to the patient's satisfaction.     Robert Rourk  No change. Surveillance colonoscopy per plan.  The risks, benefits, limitations, alternatives and imponderables have been reviewed with the patient. Questions have been answered. All parties are agreeable.

## 2015-03-24 ENCOUNTER — Encounter (HOSPITAL_COMMUNITY): Payer: Self-pay | Admitting: Internal Medicine

## 2015-05-05 DIAGNOSIS — Z6828 Body mass index (BMI) 28.0-28.9, adult: Secondary | ICD-10-CM | POA: Diagnosis not present

## 2015-05-05 DIAGNOSIS — Z1389 Encounter for screening for other disorder: Secondary | ICD-10-CM | POA: Diagnosis not present

## 2015-05-05 DIAGNOSIS — J069 Acute upper respiratory infection, unspecified: Secondary | ICD-10-CM | POA: Diagnosis not present

## 2015-07-04 DIAGNOSIS — Z1389 Encounter for screening for other disorder: Secondary | ICD-10-CM | POA: Diagnosis not present

## 2015-07-04 DIAGNOSIS — E063 Autoimmune thyroiditis: Secondary | ICD-10-CM | POA: Diagnosis not present

## 2015-07-04 DIAGNOSIS — H0014 Chalazion left upper eyelid: Secondary | ICD-10-CM | POA: Diagnosis not present

## 2015-07-04 DIAGNOSIS — Z0000 Encounter for general adult medical examination without abnormal findings: Secondary | ICD-10-CM | POA: Diagnosis not present

## 2015-07-04 DIAGNOSIS — E782 Mixed hyperlipidemia: Secondary | ICD-10-CM | POA: Diagnosis not present

## 2015-07-04 DIAGNOSIS — Z01419 Encounter for gynecological examination (general) (routine) without abnormal findings: Secondary | ICD-10-CM | POA: Diagnosis not present

## 2015-07-04 DIAGNOSIS — Z6828 Body mass index (BMI) 28.0-28.9, adult: Secondary | ICD-10-CM | POA: Diagnosis not present

## 2015-07-04 DIAGNOSIS — E663 Overweight: Secondary | ICD-10-CM | POA: Diagnosis not present

## 2016-01-13 DIAGNOSIS — Z23 Encounter for immunization: Secondary | ICD-10-CM | POA: Diagnosis not present

## 2016-04-11 ENCOUNTER — Other Ambulatory Visit (HOSPITAL_COMMUNITY): Payer: Self-pay | Admitting: Family Medicine

## 2016-04-11 DIAGNOSIS — Z1231 Encounter for screening mammogram for malignant neoplasm of breast: Secondary | ICD-10-CM

## 2016-05-02 ENCOUNTER — Ambulatory Visit (HOSPITAL_COMMUNITY): Payer: Medicare Other

## 2016-06-19 ENCOUNTER — Encounter (INDEPENDENT_AMBULATORY_CARE_PROVIDER_SITE_OTHER): Payer: Self-pay

## 2016-06-19 ENCOUNTER — Ambulatory Visit (INDEPENDENT_AMBULATORY_CARE_PROVIDER_SITE_OTHER): Payer: Medicare HMO | Admitting: Physician Assistant

## 2016-06-19 ENCOUNTER — Encounter: Payer: Self-pay | Admitting: Physician Assistant

## 2016-06-19 VITALS — BP 141/89 | HR 71 | Temp 97.0°F | Ht 62.0 in | Wt 148.8 lb

## 2016-06-19 DIAGNOSIS — E039 Hypothyroidism, unspecified: Secondary | ICD-10-CM | POA: Diagnosis not present

## 2016-06-19 DIAGNOSIS — M8580 Other specified disorders of bone density and structure, unspecified site: Secondary | ICD-10-CM

## 2016-06-19 DIAGNOSIS — E7800 Pure hypercholesterolemia, unspecified: Secondary | ICD-10-CM

## 2016-06-19 DIAGNOSIS — Z0000 Encounter for general adult medical examination without abnormal findings: Secondary | ICD-10-CM

## 2016-06-19 NOTE — Patient Instructions (Signed)

## 2016-06-20 DIAGNOSIS — M8580 Other specified disorders of bone density and structure, unspecified site: Secondary | ICD-10-CM | POA: Insufficient documentation

## 2016-06-20 LAB — CBC WITH DIFFERENTIAL/PLATELET
Basophils Absolute: 0 10*3/uL (ref 0.0–0.2)
Basos: 0 %
EOS (ABSOLUTE): 0.1 10*3/uL (ref 0.0–0.4)
EOS: 1 %
HEMATOCRIT: 41.9 % (ref 34.0–46.6)
HEMOGLOBIN: 14.3 g/dL (ref 11.1–15.9)
IMMATURE GRANS (ABS): 0 10*3/uL (ref 0.0–0.1)
IMMATURE GRANULOCYTES: 0 %
LYMPHS: 27 %
Lymphocytes Absolute: 1.7 10*3/uL (ref 0.7–3.1)
MCH: 30.2 pg (ref 26.6–33.0)
MCHC: 34.1 g/dL (ref 31.5–35.7)
MCV: 88 fL (ref 79–97)
MONOCYTES: 7 %
Monocytes Absolute: 0.4 10*3/uL (ref 0.1–0.9)
NEUTROS PCT: 65 %
Neutrophils Absolute: 4.1 10*3/uL (ref 1.4–7.0)
Platelets: 228 10*3/uL (ref 150–379)
RBC: 4.74 x10E6/uL (ref 3.77–5.28)
RDW: 13.8 % (ref 12.3–15.4)
WBC: 6.3 10*3/uL (ref 3.4–10.8)

## 2016-06-20 LAB — CMP14+EGFR
ALBUMIN: 4.8 g/dL (ref 3.5–4.8)
ALT: 36 IU/L — AB (ref 0–32)
AST: 33 IU/L (ref 0–40)
Albumin/Globulin Ratio: 1.9 (ref 1.2–2.2)
Alkaline Phosphatase: 99 IU/L (ref 39–117)
BUN/Creatinine Ratio: 16 (ref 12–28)
BUN: 10 mg/dL (ref 8–27)
Bilirubin Total: 0.8 mg/dL (ref 0.0–1.2)
CALCIUM: 10 mg/dL (ref 8.7–10.3)
CO2: 24 mmol/L (ref 18–29)
CREATININE: 0.62 mg/dL (ref 0.57–1.00)
Chloride: 100 mmol/L (ref 96–106)
GFR calc Af Amer: 106 mL/min/{1.73_m2} (ref >59)
GFR calc non Af Amer: 92 mL/min/{1.73_m2} (ref >59)
GLUCOSE: 88 mg/dL (ref 65–99)
Globulin, Total: 2.5 g/dL (ref 1.5–4.5)
Potassium: 4.9 mmol/L (ref 3.5–5.2)
Sodium: 139 mmol/L (ref 134–144)
TOTAL PROTEIN: 7.3 g/dL (ref 6.0–8.5)

## 2016-06-20 LAB — LIPID PANEL
Chol/HDL Ratio: 3.2 ratio units (ref 0.0–4.4)
Cholesterol, Total: 146 mg/dL (ref 100–199)
HDL: 45 mg/dL (ref >39)
LDL Calculated: 70 mg/dL (ref 0–99)
Triglycerides: 155 mg/dL — ABNORMAL HIGH (ref 0–149)
VLDL Cholesterol Cal: 31 mg/dL (ref 5–40)

## 2016-06-20 LAB — TSH: TSH: 2.36 u[IU]/mL (ref 0.450–4.500)

## 2016-06-20 NOTE — Progress Notes (Signed)
BP (!) 141/89   Pulse 71   Temp 97 F (36.1 C) (Oral)   Ht _0  (1.575 m)   Wt 148 lb 12.8 oz (67.5 kg)   BMI 27.22 kg/m    Subjective:    Patient ID: Tabitha Fields, female    DOB: 08-Sep-1945, 71 y.o.   MRN: [id number]  Tabitha Fields is a 71 y.o. female presenting on 06/19/2016 for New Patient (Initial Visit) and Establish Care  HPI  this is a new patient to our office. She is here for recheck and updates on medications. Her primary health concerns are hypothyroidism, hyperlipidemia, GERD, elevated blood pressure, history of diverticulitis and colon polyps, osteopenia. She is needing and up-to-date DEXA her last one was in 2014. She reports that she did have a tetanus in 2013, a shingles in 2013. She is due her lab work soon. She has lost her husband this year to long-term health issues. They have been married 27 years last year  Past Medical History:  Diagnosis Date  . GERD (gastroesophageal reflux disease)   . Hypercholesteremia   . Thyroid disease    Relevant past medical, surgical, family and social history reviewed and updated as indicated. Interim medical history since our last visit reviewed. Allergies and medications reviewed and updated.   Data reviewed from any sources in EPIC.  Review of Systems  Constitutional: Negative.  Negative for activity change, fatigue and fever.  HENT: Negative.   Eyes: Negative.   Respiratory: Negative.  Negative for cough.   Cardiovascular: Negative.  Negative for chest pain.  Gastrointestinal: Negative.  Negative for abdominal pain.  Endocrine: Negative.   Genitourinary: Negative.  Negative for dysuria.  Musculoskeletal: Negative.   Skin: Negative.   Neurological: Negative.      Social History   Social History  . Marital status: Widowed    Spouse name: N/A  . Number of children: N/A  . Years of education: N/A   Occupational History  . Not on file.   Social History Main Topics  . Smoking status: Never Smoker    . Smokeless tobacco: Never Used  . Alcohol use No  . Drug use: No  . Sexual activity: Not on file   Other Topics Concern  . Not on file   Social History Narrative  . No narrative on file    Past Surgical History:  Procedure Laterality Date  . COLONOSCOPY  07/28/04   Dr. Milana Huntsman  . COLONOSCOPY  2011   Dr. Gala Romney: anal canal hemorhoids, pancolonic diverticula, otherwise normal  . COLONOSCOPY N/A 03/17/2015   Procedure: COLONOSCOPY;  Surgeon: Daneil Dolin, MD;  [facility];  Service: Endoscopy;  Laterality: N/A;  0730  . HEMORROIDECTOMY    . TUBAL LIGATION      Family History  Problem Relation Age of Onset  . Colon cancer Neg Hx     Allergies as of 06/19/2016   No Known Allergies     Medication List       Accurate as of 06/19/16 11:59 PM. Always use your most recent med list.          Apple Cider Vinegar 500 MG Tabs Take by mouth.   aspirin EC 81 MG tablet Take 81 mg by mouth daily.   calcium carbonate 1250 MG capsule Take 1,250 mg by mouth 2 (two) times daily with a meal.   Cinnamon 500 MG capsule Take 1,000 mg by mouth daily.   Co Q 10 100 MG  Caps Take 200 mg by mouth daily.   esomeprazole 20 MG capsule Commonly known as:  NEXIUM Take 20 mg by mouth daily at 12 noon.   Fish Oil 1200 MG Caps Take by mouth.   levothyroxine 75 MCG tablet Commonly known as:  SYNTHROID, LEVOTHROID Take 75 mcg by mouth daily.   multivitamin capsule Take 1 capsule by mouth daily.   simvastatin 40 MG tablet Commonly known as:  ZOCOR Take 40 mg by mouth daily.          Objective:    BP (!) 141/89   Pulse 71   Temp 97 F (36.1 C) (Oral)   Ht _0  (1.575 m)   Wt 148 lb 12.8 oz (67.5 kg)   BMI 27.22 kg/m   No Known Allergies Wt Readings from Last 3 Encounters:  06/19/16 148 lb 12.8 oz (67.5 kg)  03/04/15 152 lb 9.6 oz (69.2 kg)  09/13/14 151 lb 6.4 oz (68.7 kg)    Physical Exam  Constitutional: She is oriented to person, place, and  time. She appears well-developed and well-nourished.  HENT:  Head: Normocephalic and atraumatic.  Right Ear: Tympanic membrane, external ear and ear canal normal.  Left Ear: Tympanic membrane, external ear and ear canal normal.  Nose: Nose normal. No rhinorrhea.  Mouth/Throat: Oropharynx is clear and moist and mucous membranes are normal. No oropharyngeal exudate or posterior oropharyngeal erythema.  Eyes: Conjunctivae and EOM are normal. Pupils are equal, round, and reactive to light.  Neck: Normal range of motion. Neck supple.  Cardiovascular: Normal rate, regular rhythm, normal heart sounds and intact distal pulses.   Pulmonary/Chest: Effort normal and breath sounds normal.  Abdominal: Soft. Bowel sounds are normal.  Neurological: She is alert and oriented to person, place, and time. She has normal reflexes.  Skin: Skin is warm and dry. No rash noted.  Psychiatric: She has a normal mood and affect. Her behavior is normal. Judgment and thought content normal.  Nursing note and vitals reviewed.       Assessment & Plan:   1. Pure hypercholesterolemia - Lipid panel - CMP14+EGFR - CBC with Differential  2. Acquired hypothyroidism - TSH  3. Osteopenia, unspecified location - DG [facility] DEXA  4. Well adult exam Not performed but present for lab ordering - TSH - Lipid panel - CMP14+EGFR - CBC with Differential - DG [facility] DEXA   Current Outpatient Prescriptions:  .  Apple Cider Vinegar 500 MG TABS, Take by mouth., Disp: , Rfl:  .  aspirin EC 81 MG tablet, Take 81 mg by mouth daily., Disp: , Rfl:  .  calcium carbonate 1250 MG capsule, Take 1,250 mg by mouth 2 (two) times daily with a meal., Disp: , Rfl:  .  Cinnamon 500 MG capsule, Take 1,000 mg by mouth daily., Disp: , Rfl:  .  Coenzyme Q10 (CO Q 10) 100 MG CAPS, Take 200 mg by mouth daily., Disp: , Rfl:  .  esomeprazole (NEXIUM) 20 MG capsule, Take 20 mg by mouth daily at 12 noon., Disp: , Rfl:  .  levothyroxine (SYNTHROID,  LEVOTHROID) 75 MCG tablet, Take 75 mcg by mouth daily. , Disp: , Rfl:  .  Multiple Vitamin (MULTIVITAMIN) capsule, Take 1 capsule by mouth daily., Disp: , Rfl:  .  Omega-3 Fatty Acids (FISH OIL) 1200 MG CAPS, Take by mouth., Disp: , Rfl:  .  simvastatin (ZOCOR) 40 MG tablet, Take 40 mg by mouth daily. , Disp: , Rfl:   Continue all other  maintenance medications as listed above. Educational handout given for low cholesterol  Follow up plan: Return if symptoms worsen or fail to improve.  Terald Sleeper PA-C [facility] [address]  [place], [place] [address] [phone]   06/20/2016, 8:30 AM

## 2016-07-09 ENCOUNTER — Ambulatory Visit (INDEPENDENT_AMBULATORY_CARE_PROVIDER_SITE_OTHER): Payer: Medicare HMO | Admitting: Physician Assistant

## 2016-07-09 ENCOUNTER — Encounter: Payer: Self-pay | Admitting: Physician Assistant

## 2016-07-09 VITALS — BP 150/88 | HR 79 | Temp 97.4°F | Ht 62.0 in | Wt 148.2 lb

## 2016-07-09 DIAGNOSIS — S0501XA Injury of conjunctiva and corneal abrasion without foreign body, right eye, initial encounter: Secondary | ICD-10-CM | POA: Diagnosis not present

## 2016-07-09 DIAGNOSIS — H1031 Unspecified acute conjunctivitis, right eye: Secondary | ICD-10-CM | POA: Diagnosis not present

## 2016-07-09 MED ORDER — CEPHALEXIN 500 MG PO CAPS: 500.0000 mg | ORAL_CAPSULE | Freq: Four times a day (QID) | ORAL | 0 refills | Status: DC

## 2016-07-09 MED ORDER — ERYTHROMYCIN 5 MG/GM OP OINT: 1.0000 "application " | TOPICAL_OINTMENT | Freq: Three times a day (TID) | OPHTHALMIC | 0 refills | Status: DC

## 2016-07-09 NOTE — Patient Instructions (Signed)

## 2016-07-09 NOTE — Progress Notes (Signed)
BP (!) 150/88   Pulse 79   Temp 97.4 F (36.3 C) (Oral)   Ht  (1.575 m)   Wt 148 lb 3.2 oz (67.2 kg)   BMI 27.11 kg/m    Subjective:    Patient ID: Tabitha Fields, female    DOB: 03-25-1946, 71 y.o.   MRN: [id number]  HPI: Tabitha Fields is a 71 y.o. female presenting on 07/09/2016 for Eye Pain (right eye red and swollen ) Approximate 3 days ago patient began having redness and swelling in her eye. It had been after couple days been outside doing yardwork. She had a antibiotic eyedrops from last year. It was trimethoprim polymyxin drops. She used them in the right eye for couple of days. She has has significant swelling in the past 2 days. It is worse than even before. She does not know of any allergies that she has had in the past.  Relevant past medical, surgical, family and social history reviewed and updated as indicated. Allergies and medications reviewed and updated.  Past Medical History:  Diagnosis Date  . GERD (gastroesophageal reflux disease)   . Hypercholesteremia   . Thyroid disease     Past Surgical History:  Procedure Laterality Date  . COLONOSCOPY  07/28/04   Dr. Betha Loa  . COLONOSCOPY  2011   Dr. Jena Gauss: anal canal hemorhoids, pancolonic diverticula, otherwise normal  . COLONOSCOPY N/A 03/17/2015   Procedure: COLONOSCOPY;  Surgeon: Corbin Ade, MD;  [facility];  Service: Endoscopy;  Laterality: N/A;  0730  . HEMORROIDECTOMY    . TUBAL LIGATION      Review of Systems  Constitutional: Negative.  Negative for activity change, fatigue and fever.  HENT: Negative.   Eyes: Positive for pain, discharge and redness. Negative for photophobia and visual disturbance.  Respiratory: Negative.  Negative for cough.   Cardiovascular: Negative.  Negative for chest pain.  Gastrointestinal: Negative.  Negative for abdominal pain.  Endocrine: Negative.   Genitourinary: Negative.  Negative for dysuria.  Musculoskeletal: Negative.   Skin:  Negative.   Neurological: Negative.     Allergies as of 07/09/2016   No Known Allergies     Medication List       Accurate as of 07/09/16 11:34 AM. Always use your most recent med list.          Apple Cider Vinegar 500 MG Tabs Take by mouth.   aspirin EC 81 MG tablet Take 81 mg by mouth daily.   calcium carbonate 1250 MG capsule Take 1,250 mg by mouth 2 (two) times daily with a meal.   cephALEXin 500 MG capsule Commonly known as:  KEFLEX Take 1 capsule (500 mg total) by mouth 4 (four) times daily.   Cinnamon 500 MG capsule Take 1,000 mg by mouth daily.   Co Q 10 100 MG Caps Take 200 mg by mouth daily.   erythromycin ophthalmic ointment Place 1 application into the right eye 3 (three) times daily.   esomeprazole 20 MG capsule Commonly known as:  NEXIUM Take 20 mg by mouth daily at 12 noon.   Fish Oil 1200 MG Caps Take by mouth.   levothyroxine 75 MCG tablet Commonly known as:  SYNTHROID, LEVOTHROID Take 75 mcg by mouth daily.   multivitamin capsule Take 1 capsule by mouth daily.   simvastatin 40 MG tablet Commonly known as:  ZOCOR Take 40 mg by mouth daily.          Objective:  BP (!) 150/88   Pulse 79   Temp 97.4 F (36.3 C) (Oral)   Ht  (1.575 m)   Wt 148 lb 3.2 oz (67.2 kg)   BMI 27.11 kg/m   No Known Allergies  Physical Exam  Constitutional: She is oriented to person, place, and time. She appears well-developed and well-nourished.  HENT:  Head: Normocephalic and atraumatic.  Eyes: EOM are normal. Pupils are equal, round, and reactive to light. Right eye exhibits discharge. Right conjunctiva is injected. Right eye exhibits normal extraocular motion and no nystagmus.  Cardiovascular: Normal rate, regular rhythm, normal heart sounds and intact distal pulses.   Pulmonary/Chest: Effort normal and breath sounds normal.  Abdominal: Soft. Bowel sounds are normal.  Neurological: She is alert and oriented to person, place, and time. She has  normal reflexes.  Skin: Skin is warm and dry. No rash noted.  Psychiatric: She has a normal mood and affect. Her behavior is normal. Judgment and thought content normal.        Assessment & Plan:   1. Acute bacterial conjunctivitis of right eye Associated periorbital swelling - erythromycin ophthalmic ointment; Place 1 application into the right eye 3 (three) times daily.  Dispense: 3.5 g; Refill: 0 - cephALEXin (KEFLEX) 500 MG capsule; Take 1 capsule (500 mg total) by mouth 4 (four) times daily.  Dispense: 40 capsule; Refill: 0  2. Conjunctival abrasion, right, initial encounter - erythromycin ophthalmic ointment; Place 1 application into the right eye 3 (three) times daily.  Dispense: 3.5 g; Refill: 0   Continue all other maintenance medications as listed above.  Follow up plan: Return if symptoms worsen or fail to improve.  Educational handout given for conjunctivitis  Remus Loffler PA-C [facility] [address]  [place], [place] [address] [phone]   07/09/2016, 11:34 AM

## 2016-07-11 DIAGNOSIS — H1031 Unspecified acute conjunctivitis, right eye: Secondary | ICD-10-CM | POA: Diagnosis not present

## 2016-07-31 ENCOUNTER — Other Ambulatory Visit: Payer: Self-pay | Admitting: *Deleted

## 2016-07-31 MED ORDER — SIMVASTATIN 40 MG PO TABS
40.0000 mg | ORAL_TABLET | Freq: Every day | ORAL | 0 refills | Status: DC
Start: 2016-07-31 — End: 2016-11-23

## 2016-08-28 ENCOUNTER — Other Ambulatory Visit: Payer: Self-pay | Admitting: Physician Assistant

## 2016-10-04 ENCOUNTER — Ambulatory Visit (INDEPENDENT_AMBULATORY_CARE_PROVIDER_SITE_OTHER): Payer: Medicare HMO | Admitting: Nurse Practitioner

## 2016-10-04 ENCOUNTER — Encounter: Payer: Self-pay | Admitting: Nurse Practitioner

## 2016-10-04 VITALS — BP 146/82 | HR 76 | Temp 97.0°F | Ht 62.0 in | Wt 149.0 lb

## 2016-10-04 DIAGNOSIS — J0100 Acute maxillary sinusitis, unspecified: Secondary | ICD-10-CM | POA: Diagnosis not present

## 2016-10-04 MED ORDER — AMOXICILLIN-POT CLAVULANATE 875-125 MG PO TABS
1.0000 | ORAL_TABLET | Freq: Two times a day (BID) | ORAL | 0 refills | Status: DC
Start: 2016-10-04 — End: 2016-11-21

## 2016-10-04 NOTE — Patient Instructions (Signed)

## 2016-10-04 NOTE — Progress Notes (Signed)
   Subjective:    Patient ID: Tabitha Fields, female    DOB: 02-20-1946, 71 y.o.   MRN: [id number]  HPI Patient comes into the office with complaints of pain in the preauricular area and stiffness in the neck.  Patient states her symptoms started about 3 weeks ago since returning home from the beach.  Patient states she has been having some dizziness and vertigo with rapid movement since her symptoms started.  Patient has been taking Mucinex intermittently and she states it relieves the symptoms, but they return.   Review of Systems  Constitutional: Negative for chills and fever.  HENT: Positive for congestion, postnasal drip, rhinorrhea (occasionally when taking Mucinex), sinus pressure and tinnitus (described as humming). Negative for trouble swallowing.   Respiratory: Negative for cough, shortness of breath and wheezing.   Cardiovascular: Negative for chest pain and palpitations.  Gastrointestinal: Negative for abdominal distention and abdominal pain.  Musculoskeletal: Positive for neck stiffness.  Neurological: Positive for dizziness and headaches (attributed to sinus).  All other systems reviewed and are negative.      Objective:   Physical Exam  Constitutional: She is oriented to person, place, and time. She appears well-developed and well-nourished. No distress.  HENT:  Head: Normocephalic and atraumatic.    Right Ear: External ear normal.  Left Ear: External ear normal.  Nose: Right sinus exhibits maxillary sinus tenderness. Right sinus exhibits no frontal sinus tenderness. Left sinus exhibits maxillary sinus tenderness. Left sinus exhibits no frontal sinus tenderness.  Eyes: Pupils are equal, round, and reactive to light.  Neck: Normal range of motion. Neck supple. No JVD present. No thyromegaly present.  Cardiovascular: Normal rate, regular rhythm, normal heart sounds and intact distal pulses.   Pulmonary/Chest: Effort normal and breath sounds normal. No respiratory  distress. She has no wheezes.  Abdominal: Soft. Bowel sounds are normal. She exhibits no distension. There is no tenderness.  Musculoskeletal: Normal range of motion.  Lymphadenopathy:    She has cervical adenopathy.  Neurological: She is alert and oriented to person, place, and time.  Skin: Skin is warm and dry.  Psychiatric: She has a normal mood and affect. Her behavior is normal. Judgment and thought content normal.   BP (!) 146/82   Pulse 76   Temp (!) 97 F (36.1 C) (Oral)   Ht 5\' 2"  (1.575 m)   Wt 149 lb (67.6 kg)   BMI 27.25 kg/m      Assessment & Plan:  1. Acute maxillary sinusitis, recurrence not specified 1. Take meds as prescribed 2. Use a cool mist humidifier especially during the winter months and when heat has been humid. 3. Use saline nose sprays frequently 4. Saline irrigations of the nose can be very helpful if done frequently.  * 4X daily for 1 week*  * Use of a nettie pot can be helpful with this. Follow directions with this* 5. Drink plenty of fluids 6. Keep thermostat turn down low 7.For any cough or congestion  Use plain Mucinex- regular strength or max strength is fine   * Children- consult with Pharmacist for dosing 8. For fever or aces or pains- take tylenol or ibuprofen appropriate for age and weight.  * for fevers greater than 101 orally you may alternate ibuprofen and tylenol every  3 hours.    - amoxicillin-clavulanate (AUGMENTIN) 875-125 MG tablet; Take 1 tablet by mouth 2 (two) times daily.  Dispense: 20 tablet; Refill: 0    Mary-Linnie Daphine DeutscherMartin, FNP

## 2016-11-21 ENCOUNTER — Ambulatory Visit (INDEPENDENT_AMBULATORY_CARE_PROVIDER_SITE_OTHER): Payer: Medicare HMO | Admitting: Physician Assistant

## 2016-11-21 ENCOUNTER — Encounter: Payer: Self-pay | Admitting: Physician Assistant

## 2016-11-21 VITALS — BP 142/88 | HR 72 | Temp 97.1°F | Ht 62.0 in | Wt 148.4 lb

## 2016-11-21 DIAGNOSIS — E7800 Pure hypercholesterolemia, unspecified: Secondary | ICD-10-CM | POA: Diagnosis not present

## 2016-11-21 DIAGNOSIS — E039 Hypothyroidism, unspecified: Secondary | ICD-10-CM | POA: Diagnosis not present

## 2016-11-21 DIAGNOSIS — J301 Allergic rhinitis due to pollen: Secondary | ICD-10-CM

## 2016-11-21 NOTE — Progress Notes (Signed)
BP (!) 142/88   Pulse 72   Temp (!) 97.1 F (36.2 C) (Oral)   Ht _0  (1.575 m)   Wt 148 lb 6.4 oz (67.3 kg)   BMI 27.14 kg/m    Subjective:    Patient ID: Tabitha Fields, female    DOB: [birth year], 71 y.o.   MRN: [id number]  HPI: Tabitha Fields is a 71 y.o. female presenting on 11/21/2016 for Follow-up (labs and recheck)  This patient comes in for periodic recheck on medications and conditions including Hypothyroidism, hyperlipidemia, allergic rhinitis. Overall she is feeling well. She has not had any difficulties with her medication. She is due updated lab work today. Her cholesterol has been slightly high in the past. She has been on stable thyroid medication for some time. If the dosing this changes we will send a new refills..   All medications are reviewed today. There are no reports of any problems with the medications. All of the medical conditions are reviewed and updated.  Lab work is reviewed and will be ordered as medically necessary. There are no new problems reported with today's visit.   Relevant past medical, surgical, family and social history reviewed and updated as indicated. Allergies and medications reviewed and updated.  Past Medical History:  Diagnosis Date  . GERD (gastroesophageal reflux disease)   . Hypercholesteremia   . Thyroid disease     Past Surgical History:  Procedure Laterality Date  . COLONOSCOPY  07/28/04   Dr. Milana Huntsman  . COLONOSCOPY  2011   Dr. Gala Romney: anal canal hemorhoids, pancolonic diverticula, otherwise normal  . COLONOSCOPY N/A 03/17/2015   Procedure: COLONOSCOPY;  Surgeon: Daneil Dolin, MD;  Location: [facility];  Service: Endoscopy;  Laterality: N/A;  0730  . HEMORROIDECTOMY    . TUBAL LIGATION      Review of Systems  Constitutional: Negative.   HENT: Negative.   Eyes: Negative.   Respiratory: Negative.   Gastrointestinal: Negative.   Genitourinary: Negative.     Allergies as of 11/21/2016   No Known  Allergies     Medication List       Accurate as of 11/21/16  4:06 PM. Always use your most recent med list.          Apple Cider Vinegar 500 MG Tabs Take by mouth.   aspirin EC 81 MG tablet Take 81 mg by mouth daily.   calcium carbonate 1250 MG capsule Take 1,250 mg by mouth 2 (two) times daily with a meal.   Cinnamon 500 MG capsule Take 1,000 mg by mouth daily.   Co Q 10 100 MG Caps Take 200 mg by mouth daily.   esomeprazole 20 MG capsule Commonly known as:  NEXIUM Take 20 mg by mouth daily at 12 noon.   Fish Oil 1200 MG Caps Take by mouth.   levothyroxine 75 MCG tablet Commonly known as:  SYNTHROID, LEVOTHROID TAKE 1 TABLET BY MOUTH EVERY DAY   multivitamin capsule Take 1 capsule by mouth daily.   simvastatin 40 MG tablet Commonly known as:  ZOCOR Take 1 tablet (40 mg total) by mouth daily.            Discharge Care Instructions        Start     Ordered   11/21/16 0000  Lipid panel     11/21/16 0837   11/21/16 0000  TSH     11/21/16 0837         Objective:  BP (!) 142/88   Pulse 72   Temp (!) 97.1 F (36.2 C) (Oral)   Ht _0  (1.575 m)   Wt 148 lb 6.4 oz (67.3 kg)   BMI 27.14 kg/m   No Known Allergies  Physical Exam  Constitutional: She is oriented to person, place, and time. She appears well-developed and well-nourished.  HENT:  Head: Normocephalic and atraumatic.  Eyes: Pupils are equal, round, and reactive to light. Conjunctivae and EOM are normal.  Cardiovascular: Normal rate, regular rhythm, normal heart sounds and intact distal pulses.   Pulmonary/Chest: Effort normal and breath sounds normal.  Abdominal: Soft. Bowel sounds are normal.  Neurological: She is alert and oriented to person, place, and time. She has normal reflexes.  Skin: Skin is warm and dry. No rash noted.  Psychiatric: She has a normal mood and affect. Her behavior is normal. Judgment and thought content normal.  Nursing note and vitals  reviewed.   Results for orders placed or performed in visit on 06/19/16  TSH  Result Value Ref Range   TSH 2.360 0.450 - 4.500 uIU/mL  Lipid panel  Result Value Ref Range   Cholesterol, Total 146 100 - 199 mg/dL   Triglycerides 155 (H) 0 - 149 mg/dL   HDL 45 >39 mg/dL   VLDL Cholesterol Cal 31 5 - 40 mg/dL   LDL Calculated 70 0 - 99 mg/dL   Chol/HDL Ratio 3.2 0.0 - 4.4 ratio units  CMP14+EGFR  Result Value Ref Range   Glucose 88 65 - 99 mg/dL   BUN 10 8 - 27 mg/dL   Creatinine, Ser 0.62 0.57 - 1.00 mg/dL   GFR calc non Af Amer 92 >59 mL/min/1.73   GFR calc Af Amer 106 >59 mL/min/1.73   BUN/Creatinine Ratio 16 12 - 28   Sodium 139 134 - 144 mmol/L   Potassium 4.9 3.5 - 5.2 mmol/L   Chloride 100 96 - 106 mmol/L   CO2 24 18 - 29 mmol/L   Calcium 10.0 8.7 - 10.3 mg/dL   Total Protein 7.3 6.0 - 8.5 g/dL   Albumin 4.8 3.5 - 4.8 g/dL   Globulin, Total 2.5 1.5 - 4.5 g/dL   Albumin/Globulin Ratio 1.9 1.2 - 2.2   Bilirubin Total 0.8 0.0 - 1.2 mg/dL   Alkaline Phosphatase 99 39 - 117 IU/L   AST 33 0 - 40 IU/L   ALT 36 (H) 0 - 32 IU/L  CBC with Differential  Result Value Ref Range   WBC 6.3 3.4 - 10.8 x10E3/uL   RBC 4.74 3.77 - 5.28 x10E6/uL   Hemoglobin 14.3 11.1 - 15.9 g/dL   Hematocrit 41.9 34.0 - 46.6 %   MCV 88 79 - 97 fL   MCH 30.2 26.6 - 33.0 pg   MCHC 34.1 31.5 - 35.7 g/dL   RDW 13.8 12.3 - 15.4 %   Platelets 228 150 - 379 x10E3/uL   Neutrophils 65 Not Estab. %   Lymphs 27 Not Estab. %   Monocytes 7 Not Estab. %   Eos 1 Not Estab. %   Basos 0 Not Estab. %   Neutrophils Absolute 4.1 1.4 - 7.0 x10E3/uL   Lymphocytes Absolute 1.7 0.7 - 3.1 x10E3/uL   Monocytes Absolute 0.4 0.1 - 0.9 x10E3/uL   EOS (ABSOLUTE) 0.1 0.0 - 0.4 x10E3/uL   Basophils Absolute 0.0 0.0 - 0.2 x10E3/uL   Immature Granulocytes 0 Not Estab. %   Immature Grans (Abs) 0.0 0.0 - 0.1 x10E3/uL  Assessment & Plan:   1. Acquired hypothyroidism - TSH  2. Pure hypercholesterolemia - Lipid  panel  3. Allergic rhinitis due to pollen, unspecified seasonality    Current Outpatient Prescriptions:  .  Apple Cider Vinegar 500 MG TABS, Take by mouth., Disp: , Rfl:  .  aspirin EC 81 MG tablet, Take 81 mg by mouth daily., Disp: , Rfl:  .  calcium carbonate 1250 MG capsule, Take 1,250 mg by mouth 2 (two) times daily with a meal., Disp: , Rfl:  .  Cinnamon 500 MG capsule, Take 1,000 mg by mouth daily., Disp: , Rfl:  .  Coenzyme Q10 (CO Q 10) 100 MG CAPS, Take 200 mg by mouth daily., Disp: , Rfl:  .  esomeprazole (NEXIUM) 20 MG capsule, Take 20 mg by mouth daily at 12 noon., Disp: , Rfl:  .  levothyroxine (SYNTHROID, LEVOTHROID) 75 MCG tablet, TAKE 1 TABLET BY MOUTH EVERY DAY, Disp: 90 tablet, Rfl: 2 .  Multiple Vitamin (MULTIVITAMIN) capsule, Take 1 capsule by mouth daily., Disp: , Rfl:  .  Omega-3 Fatty Acids (FISH OIL) 1200 MG CAPS, Take by mouth., Disp: , Rfl:  .  simvastatin (ZOCOR) 40 MG tablet, Take 1 tablet (40 mg total) by mouth daily., Disp: 90 tablet, Rfl: 0 Continue all other maintenance medications as listed above.  Follow up plan: Return in about 6 months (around 05/24/2017) for recheck.  Educational handout given for [place] PA-C [facility] [address]  [place], [place] [address] [phone]   11/21/2016, 4:06 PM

## 2016-11-21 NOTE — Patient Instructions (Signed)
In a few days you may receive a survey in the mail or online from [employer] regarding your visit with us today. Please take a moment to fill this out. Your feedback is very important to our whole office. It can help us better understand your needs as well as improve your experience and satisfaction. Thank you for taking your time to complete it. We care about you.  Sarahlynn Cisnero, PA-C  

## 2016-11-22 LAB — LIPID PANEL
CHOL/HDL RATIO: 3.3 ratio (ref 0.0–4.4)
CHOLESTEROL TOTAL: 145 mg/dL (ref 100–199)
HDL: 44 mg/dL (ref >39)
LDL Calculated: 69 mg/dL (ref 0–99)
TRIGLYCERIDES: 158 mg/dL — AB (ref 0–149)
VLDL Cholesterol Cal: 32 mg/dL (ref 5–40)

## 2016-11-22 LAB — TSH: TSH: 7.38 u[IU]/mL — ABNORMAL HIGH (ref 0.450–4.500)

## 2016-11-23 ENCOUNTER — Other Ambulatory Visit: Payer: Self-pay | Admitting: Physician Assistant

## 2016-11-23 MED ORDER — LEVOTHYROXINE SODIUM 100 MCG PO TABS: 100.0000 ug | ORAL_TABLET | Freq: Every day | ORAL | 0 refills | Status: DC

## 2016-11-23 MED ORDER — SIMVASTATIN 40 MG PO TABS: 40.0000 mg | ORAL_TABLET | Freq: Every day | ORAL | 2 refills | Status: DC

## 2016-11-23 NOTE — Addendum Note (Signed)
Addended by: Tamera Punt on: 11/23/2016 10:09 AM   Modules accepted: Orders

## 2016-12-19 DIAGNOSIS — Z23 Encounter for immunization: Secondary | ICD-10-CM | POA: Diagnosis not present

## 2017-02-19 ENCOUNTER — Other Ambulatory Visit: Payer: Medicare HMO

## 2017-02-19 DIAGNOSIS — E039 Hypothyroidism, unspecified: Secondary | ICD-10-CM

## 2017-02-20 LAB — TSH: TSH: 4.16 u[IU]/mL (ref 0.450–4.500)

## 2017-02-25 ENCOUNTER — Other Ambulatory Visit: Payer: Self-pay | Admitting: *Deleted

## 2017-02-25 MED ORDER — LEVOTHYROXINE SODIUM 100 MCG PO TABS: 100.0000 ug | ORAL_TABLET | Freq: Every day | ORAL | 1 refills | Status: DC

## 2017-05-09 ENCOUNTER — Other Ambulatory Visit: Payer: Self-pay | Admitting: Physician Assistant

## 2017-05-09 DIAGNOSIS — Z1231 Encounter for screening mammogram for malignant neoplasm of breast: Secondary | ICD-10-CM

## 2017-05-13 ENCOUNTER — Ambulatory Visit (HOSPITAL_COMMUNITY)
Admission: RE | Admit: 2017-05-13 | Discharge: 2017-05-13 | Disposition: A | Discharge status: Home / Self Care | Payer: Medicare HMO | Source: Ambulatory Visit | Attending: Physician Assistant | Admitting: Physician Assistant

## 2017-05-13 DIAGNOSIS — Z1231 Encounter for screening mammogram for malignant neoplasm of breast: Secondary | ICD-10-CM | POA: Diagnosis not present

## 2017-05-23 ENCOUNTER — Encounter: Payer: Self-pay | Admitting: Physician Assistant

## 2017-05-24 ENCOUNTER — Other Ambulatory Visit: Payer: Self-pay | Admitting: Physician Assistant

## 2017-05-24 ENCOUNTER — Ambulatory Visit: Payer: Medicare HMO | Admitting: Physician Assistant

## 2017-05-24 DIAGNOSIS — R1310 Dysphagia, unspecified: Secondary | ICD-10-CM

## 2017-05-27 ENCOUNTER — Encounter: Payer: Self-pay | Admitting: Internal Medicine

## 2017-05-28 ENCOUNTER — Encounter: Payer: Self-pay | Admitting: Physician Assistant

## 2017-05-28 ENCOUNTER — Ambulatory Visit: Payer: Medicare HMO | Admitting: Physician Assistant

## 2017-05-28 VITALS — BP 151/75 | HR 71 | Temp 97.3°F | Ht 62.0 in | Wt 144.8 lb

## 2017-05-28 DIAGNOSIS — K2100 Gastro-esophageal reflux disease with esophagitis, without bleeding: Secondary | ICD-10-CM | POA: Insufficient documentation

## 2017-05-28 DIAGNOSIS — R1310 Dysphagia, unspecified: Secondary | ICD-10-CM | POA: Diagnosis not present

## 2017-05-28 DIAGNOSIS — R1319 Other dysphagia: Secondary | ICD-10-CM | POA: Insufficient documentation

## 2017-05-28 MED ORDER — ESOMEPRAZOLE MAGNESIUM 40 MG PO CPDR: 40.0000 mg | DELAYED_RELEASE_CAPSULE | Freq: Every day | ORAL | 11 refills | Status: DC

## 2017-05-28 MED ORDER — SUCRALFATE 1 G PO TABS: 1.0000 g | ORAL_TABLET | Freq: Three times a day (TID) | ORAL | 2 refills | Status: DC

## 2017-05-28 NOTE — Patient Instructions (Signed)
In a few days you may receive a survey in the mail or online from [employer] regarding your visit with us today. Please take a moment to fill this out. Your feedback is very important to our whole office. It can help us better understand your needs as well as improve your experience and satisfaction. Thank you for taking your time to complete it. We care about you.  Zaley Talley, PA-C  

## 2017-05-28 NOTE — Progress Notes (Signed)
BP (!) 151/75   Pulse 71   Temp (!) 97.3 F (36.3 C) (Oral)   Ht 5\' 2"  (1.575 m)   Wt 144 lb 12.8 oz (65.7 kg)   BMI 26.48 kg/m    Subjective:    Patient ID: Tabitha Fields, female    DOB: 09/05/1945, 72 y.o.   MRN: [id number]  HPI: Tabitha Fields is a 72 y.o. female presenting on 05/28/2017 for Follow-up (6 month ) and Choking (when she eats )  She has severe GERD episode. She had eaten a soup spiced with [place] a few weeks ago. She had harsh vomiting as soon as she ate it. She has had esophageal pain no matter what she eats. Often eats standing up to relieve the pressure.  If her bra is on she had hard pain in the mid sternal esophagus area. Has greatly decreased her quantity of food.   Also with left anterior cervical chain node, either lymph node or possible thyroglossal cyst. Will follow.  Past Medical History:  Diagnosis Date  . GERD (gastroesophageal reflux disease)   . Hypercholesteremia   . Thyroid disease    Relevant past medical, surgical, family and social history reviewed and updated as indicated. Interim medical history since our last visit reviewed. Allergies and medications reviewed and updated. DATA REVIEWED: CHART IN EPIC  Family History reviewed for pertinent findings.  Review of Systems  Constitutional: Positive for fatigue. Negative for activity change and fever.  HENT: Negative.   Eyes: Negative.   Respiratory: Negative.  Negative for cough.   Cardiovascular: Negative.  Negative for chest pain.  Gastrointestinal: Positive for abdominal distention, abdominal pain, nausea and vomiting.  Endocrine: Negative.   Genitourinary: Negative.  Negative for dysuria.  Musculoskeletal: Negative.   Skin: Negative.   Neurological: Negative.     Allergies as of 05/28/2017   No Known Allergies     Medication List        Accurate as of 05/28/17  9:09 AM. Always use your most recent med list.          Apple Cider Vinegar 500 MG Tabs Take by  mouth.   aspirin EC 81 MG tablet Take 81 mg by mouth daily.   calcium carbonate 1250 MG capsule Take 1,250 mg by mouth 2 (two) times daily with a meal.   Co Q 10 100 MG Caps Take 200 mg by mouth daily.   esomeprazole 40 MG capsule Commonly known as:  NEXIUM Take 1 capsule (40 mg total) by mouth daily at 12 noon.   Fish Oil 1200 MG Caps Take by mouth.   levothyroxine 100 MCG tablet Commonly known as:  SYNTHROID, LEVOTHROID Take 1 tablet (100 mcg total) by mouth daily.   multivitamin capsule Take 1 capsule by mouth daily.   simvastatin 40 MG tablet Commonly known as:  ZOCOR Take 1 tablet (40 mg total) by mouth daily.   sucralfate 1 g tablet Commonly known as:  CARAFATE Take 1 tablet (1 g total) by mouth 4 (four) times daily -  with meals and at bedtime.          Objective:    BP (!) 151/75   Pulse 71   Temp (!) 97.3 F (36.3 C) (Oral)   Ht 5\' 2"  (1.575 m)   Wt 144 lb 12.8 oz (65.7 kg)   BMI 26.48 kg/m   No Known Allergies  Wt Readings from Last 3 Encounters:  05/28/17 144 lb 12.8 oz (65.7 kg)  11/21/16  148 lb 6.4 oz (67.3 kg)  10/04/16 149 lb (67.6 kg)    Physical Exam  Constitutional: She is oriented to person, place, and time. She appears well-developed and well-nourished.  HENT:  Head: Normocephalic and atraumatic.  Eyes: Conjunctivae and EOM are normal. Pupils are equal, round, and reactive to light.  Cardiovascular: Normal rate, regular rhythm, normal heart sounds and intact distal pulses.  Pulmonary/Chest: Effort normal and breath sounds normal.  Abdominal: Soft. Bowel sounds are normal. She exhibits distension. She exhibits no mass. There is tenderness. There is no rebound and no guarding.  Lymphadenopathy:       Head (left side): Submandibular adenopathy present.  Neurological: She is alert and oriented to person, place, and time. She has normal reflexes.  Skin: Skin is warm and dry. No rash noted.  Psychiatric: She has a normal mood and affect.  Her behavior is normal. Judgment and thought content normal.    Results for orders placed or performed in visit on 02/19/17  TSH  Result Value Ref Range   TSH 4.160 0.450 - 4.500 uIU/mL      Assessment & Plan:   1. Esophageal dysphagia - esomeprazole (NEXIUM) 40 MG capsule; Take 1 capsule (40 mg total) by mouth daily at 12 noon.  Dispense: 30 capsule; Refill: 11 - sucralfate (CARAFATE) 1 g tablet; Take 1 tablet (1 g total) by mouth 4 (four) times daily -  with meals and at bedtime.  Dispense: 120 tablet; Refill: 2  2. Gastroesophageal reflux disease with esophagitis - esomeprazole (NEXIUM) 40 MG capsule; Take 1 capsule (40 mg total) by mouth daily at 12 noon.  Dispense: 30 capsule; Refill: 11 - sucralfate (CARAFATE) 1 g tablet; Take 1 tablet (1 g total) by mouth 4 (four) times daily -  with meals and at bedtime.  Dispense: 120 tablet; Refill: 2   Continue all other maintenance medications as listed above.  Follow up plan: Recheck 2 months  Educational handout given for survey  Remus Loffler PA-C [facility] [address]  [place], [place] [address] [phone]   05/28/2017, 9:09 AM

## 2017-05-29 ENCOUNTER — Encounter: Payer: Self-pay | Admitting: Gastroenterology

## 2017-05-29 ENCOUNTER — Other Ambulatory Visit: Payer: Self-pay | Admitting: *Deleted

## 2017-05-29 ENCOUNTER — Ambulatory Visit: Payer: Medicare HMO | Admitting: Gastroenterology

## 2017-05-29 ENCOUNTER — Telehealth: Payer: Self-pay | Admitting: *Deleted

## 2017-05-29 ENCOUNTER — Other Ambulatory Visit: Payer: Self-pay | Admitting: Physician Assistant

## 2017-05-29 ENCOUNTER — Encounter: Payer: Self-pay | Admitting: *Deleted

## 2017-05-29 VITALS — BP 168/90 | HR 69 | Temp 95.7°F | Ht 62.0 in | Wt 143.4 lb

## 2017-05-29 DIAGNOSIS — R1310 Dysphagia, unspecified: Secondary | ICD-10-CM

## 2017-05-29 DIAGNOSIS — K219 Gastro-esophageal reflux disease without esophagitis: Secondary | ICD-10-CM

## 2017-05-29 DIAGNOSIS — R1319 Other dysphagia: Secondary | ICD-10-CM

## 2017-05-29 DIAGNOSIS — Z800 Family history of malignant neoplasm of digestive organs: Secondary | ICD-10-CM | POA: Insufficient documentation

## 2017-05-29 MED ORDER — LISINOPRIL 10 MG PO TABS: 10.0000 mg | ORAL_TABLET | Freq: Every day | ORAL | 0 refills | Status: DC

## 2017-05-29 NOTE — H&P (View-Only) (Signed)
Primary Care Physician:  Remus LofflerJones, Angel S, PA-C  Primary Gastroenterologist:  Roetta SessionsMichael Rourk, MD   Chief Complaint  Patient presents with  . Dysphagia  . Emesis    after eating    HPI:  Tabitha Fields is a 72 y.o. female here at the request of Prudy Feelerngel Jones, [facility] for further evaluation of dysphagia. Patient gives a history of chronic Genella RifeGerd which is typically well controlled on Nexium. She's been on Nexium for years. On January 28 she was eating out and had tomato basil soup which she describes as being very hot and spicy light drinking Tabasco sauce. She began vomiting before leaving the restaurant. Since then she's had the sensation throat, hoarseness, solid food dysphagia. She tolerates 4 to 5 tablespoons and then has to wait or the food comes back up. She spaces her food throughout the day. She is only lost a couple pounds. She tolerates liquids without any problems. She also has a history of enlarged left neck lymph node dating back to 2016 which is been evaluated by her PCP in the past. Patient tells me she had an ultrasound however all I can find is ultrasound of her thyroid which was unremarkable back in 2016. Recently it had enlarged but states that is smaller again. They are contemplating repeating ultrasound.  Patient states she had come off of her Nexium in November and did well up until recent symptoms. She is now back on Nexium 40 mg daily with good control of typical heartburn. She denies abdominal pain. She notes she has to eat standing up when she is at home. She is afraid to go to restaurants because she can't eat a few bites of solid foods prior to vomiting. Particularly has trouble with meat, rice. She bought Boost yesterday to help maintain her weight.   No issues with bowel function. No melena, brbpr. No odynophagia.   Brother had esophageal cancer and died at age 72.  Current Outpatient Medications  Medication Sig Dispense Refill  . Apple Cider Vinegar 500 MG TABS Take by  mouth.    Marland Kitchen. aspirin EC 81 MG tablet Take 81 mg by mouth daily.    . calcium carbonate 1250 MG capsule Take 1,250 mg by mouth 2 (two) times daily with a meal.    . Coenzyme Q10 (CO Q 10) 100 MG CAPS Take 200 mg by mouth daily.    Marland Kitchen. esomeprazole (NEXIUM) 40 MG capsule Take 1 capsule (40 mg total) by mouth daily at 12 noon. (Patient taking differently: Take 40 mg by mouth daily before breakfast. ) 30 capsule 11  . levothyroxine (SYNTHROID, LEVOTHROID) 100 MCG tablet Take 1 tablet (100 mcg total) by mouth daily. 90 tablet 1  . Multiple Vitamin (MULTIVITAMIN) capsule Take 1 capsule by mouth daily.    . Omega-3 Fatty Acids (FISH OIL) 1200 MG CAPS Take by mouth daily.     . simvastatin (ZOCOR) 40 MG tablet Take 1 tablet (40 mg total) by mouth daily. 90 tablet 2  . sucralfate (CARAFATE) 1 g tablet Take 1 tablet (1 g total) by mouth 4 (four) times daily -  with meals and at bedtime. 120 tablet 2   No current facility-administered medications for this visit.     Allergies as of 05/29/2017  . (No Known Allergies)    Past Medical History:  Diagnosis Date  . GERD (gastroesophageal reflux disease)   . Hypercholesteremia   . Thyroid disease     Past Surgical History:  Procedure Laterality Date  .  COLONOSCOPY  07/28/04   Dr. Betha Loa  . COLONOSCOPY  2011   Dr. Jena Gauss: anal canal hemorhoids, pancolonic diverticula, otherwise normal  . COLONOSCOPY N/A 03/17/2015   DR. Rourk: diverticulosis. consider one last tcs in 5 years for prior h/o tubular adenoma.  Marland Kitchen HEMORROIDECTOMY    . TUBAL LIGATION      Family History  Problem Relation Age of Onset  . Esophageal cancer Brother 84  . Colon cancer Neg Hx     Social History   Socioeconomic History  . Marital status: Widowed    Spouse name: Not on file  . Number of children: Not on file  . Years of education: Not on file  . Highest education level: Not on file  Social Needs  . Financial resource strain: Not on file  . Food insecurity -  worry: Not on file  . Food insecurity - inability: Not on file  . Transportation needs - medical: Not on file  . Transportation needs - non-medical: Not on file  Occupational History  . Not on file  Tobacco Use  . Smoking status: Never Smoker  . Smokeless tobacco: Never Used  Substance and Sexual Activity  . Alcohol use: No    Alcohol/week: 0.0 oz  . Drug use: No  . Sexual activity: Not on file  Other Topics Concern  . Not on file  Social History Narrative  . Not on file      ROS:  General: Negative for anorexia, weight loss, fever, chills, fatigue, weakness. Eyes: Negative for vision changes.  ENT: Negative for hoarseness,   nasal congestion.see hpi CV: Negative for chest pain, angina, palpitations, dyspnea on exertion, peripheral edema.  Respiratory: Negative for dyspnea at rest, dyspnea on exertion, cough, sputum, wheezing.  GI: See history of present illness. GU:  Negative for dysuria, hematuria, urinary incontinence, urinary frequency, nocturnal urination.  MS: Negative for joint pain, low back pain.  Derm: Negative for rash or itching.  Neuro: Negative for weakness, abnormal sensation, seizure, frequent headaches, memory loss, confusion.  Psych: Negative for anxiety, depression, suicidal ideation, hallucinations.  Endo: Negative for unusual weight change.  Heme: Negative for bruising or bleeding. Allergy: Negative for rash or hives.    Physical Examination:  BP (!) 168/90 (BP Location: Right Arm, Cuff Size: Normal)   Pulse 69   Temp (!) 95.7 F (35.4 C) (Axillary)   Ht 5\' 2"  (1.575 m)   Wt 143 lb 6.4 oz (65 kg)   BMI 26.23 kg/m    General: Well-nourished, well-developed in no acute distress.  Head: Normocephalic, atraumatic.   Eyes: Conjunctiva pink, no icterus. Mouth: Oropharyngeal mucosa moist and pink , no lesions erythema or exudate. Neck: Supple without thyromegaly, masses, or lymphadenopathy.  Lungs: Clear to auscultation bilaterally.  Heart:  Regular rate and rhythm, no murmurs rubs or gallops.  Abdomen: Bowel sounds are normal, nontender, nondistended, no hepatosplenomegaly or masses, no abdominal bruits or    hernia , no rebound or guarding.   Rectal: not performed Extremities: No lower extremity edema. No clubbing or deformities.  Neuro: Alert and oriented x 4 , grossly normal neurologically.  Skin: Warm and dry, no rash or jaundice.   Psych: Alert and cooperative, normal mood and affect.   Imaging Studies: Mm Screening Breast Tomo Bilateral  Result Date: 05/13/2017 CLINICAL DATA:  Screening. EXAM: DIGITAL SCREENING BILATERAL MAMMOGRAM WITH TOMO AND CAD COMPARISON:  Previous exam(s). ACR Breast Density Category c: The breast tissue is heterogeneously dense, which may obscure small masses.  FINDINGS: There are no findings suspicious for malignancy. Images were processed with CAD. IMPRESSION: No mammographic evidence of malignancy. A result letter of this screening mammogram will be mailed directly to the patient. RECOMMENDATION: Screening mammogram in one year. (Code:SM-B-01Y) BI-RADS CATEGORY  1: Negative. Electronically Signed   By: Harmon Pier M.D.   On: 05/13/2017 16:21

## 2017-05-29 NOTE — Telephone Encounter (Signed)
Pt aware and verbalized understanding.  

## 2017-05-29 NOTE — Progress Notes (Signed)
Primary Care Physician:  Tabitha Fields  Primary Gastroenterologist:  Tabitha SessionsMichael Rourk, MD   Chief Complaint  Patient presents with  . Dysphagia  . Emesis    after eating    HPI:  Tabitha Fields is a 72 y.o. female here at the request of Tabitha Fields for further evaluation of dysphagia. Patient gives a history of chronic Genella RifeGerd which is typically well controlled on Nexium. She's been on Nexium for years. On January 28 she was eating out and had tomato basil soup which she describes as being very hot and spicy light drinking Tabasco sauce. She began vomiting before leaving the restaurant. Since then she's had the sensation throat, hoarseness, solid food dysphagia. She tolerates 4 to 5 tablespoons and then has to wait or the food comes back up. She spaces her food throughout the day. She is only lost a couple pounds. She tolerates liquids without any problems. She also has a history of enlarged left neck lymph node dating back to 2016 which is been evaluated by her PCP in the past. Patient tells me she had an ultrasound however all I can find is ultrasound of her thyroid which was unremarkable back in 2016. Recently it had enlarged but states that is smaller again. They are contemplating repeating ultrasound.  Patient states she had come off of her Nexium in November and did well up until recent symptoms. She is now back on Nexium 40 mg daily with good control of typical heartburn. She denies abdominal pain. She notes she has to eat standing up when she is at home. She is afraid to go to restaurants because she can't eat a few bites of solid foods prior to vomiting. Particularly has trouble with meat, rice. She bought Boost yesterday to help maintain her weight.   No issues with bowel function. No melena, brbpr. No odynophagia.   Brother had esophageal cancer and died at age 72.  Current Outpatient Medications  Medication Sig Dispense Refill  . Apple Cider Vinegar 500 MG TABS Take by  mouth.    Marland Kitchen. aspirin EC 81 MG tablet Take 81 mg by mouth daily.    . calcium carbonate 1250 MG capsule Take 1,250 mg by mouth 2 (two) times daily with a meal.    . Coenzyme Q10 (CO Q 10) 100 MG CAPS Take 200 mg by mouth daily.    Marland Kitchen. esomeprazole (NEXIUM) 40 MG capsule Take 1 capsule (40 mg total) by mouth daily at 12 noon. (Patient taking differently: Take 40 mg by mouth daily before breakfast. ) 30 capsule 11  . levothyroxine (SYNTHROID, LEVOTHROID) 100 MCG tablet Take 1 tablet (100 mcg total) by mouth daily. 90 tablet 1  . Multiple Vitamin (MULTIVITAMIN) capsule Take 1 capsule by mouth daily.    . Omega-3 Fatty Acids (FISH OIL) 1200 MG CAPS Take by mouth daily.     . simvastatin (ZOCOR) 40 MG tablet Take 1 tablet (40 mg total) by mouth daily. 90 tablet 2  . sucralfate (CARAFATE) 1 g tablet Take 1 tablet (1 g total) by mouth 4 (four) times daily -  with meals and at bedtime. 120 tablet 2   No current facility-administered medications for this visit.     Allergies as of 05/29/2017  . (No Known Allergies)    Past Medical History:  Diagnosis Date  . GERD (gastroesophageal reflux disease)   . Hypercholesteremia   . Thyroid disease     Past Surgical History:  Procedure Laterality Date  .  COLONOSCOPY  07/28/04   Dr. Betha Loa  . COLONOSCOPY  2011   Dr. Jena Gauss: anal canal hemorhoids, pancolonic diverticula, otherwise normal  . COLONOSCOPY N/A 03/17/2015   DR. Rourk: diverticulosis. consider one last tcs in 5 years for prior h/o tubular adenoma.  Marland Kitchen HEMORROIDECTOMY    . TUBAL LIGATION      Family History  Problem Relation Age of Onset  . Esophageal cancer Brother 84  . Colon cancer Neg Hx     Social History   Socioeconomic History  . Marital status: Widowed    Spouse name: Not on file  . Number of children: Not on file  . Years of education: Not on file  . Highest education level: Not on file  Social Needs  . Financial resource strain: Not on file  . Food insecurity -  worry: Not on file  . Food insecurity - inability: Not on file  . Transportation needs - medical: Not on file  . Transportation needs - non-medical: Not on file  Occupational History  . Not on file  Tobacco Use  . Smoking status: Never Smoker  . Smokeless tobacco: Never Used  Substance and Sexual Activity  . Alcohol use: No    Alcohol/week: 0.0 oz  . Drug use: No  . Sexual activity: Not on file  Other Topics Concern  . Not on file  Social History Narrative  . Not on file      ROS:  General: Negative for anorexia, weight loss, fever, chills, fatigue, weakness. Eyes: Negative for vision changes.  ENT: Negative for hoarseness,   nasal congestion.see hpi CV: Negative for chest pain, angina, palpitations, dyspnea on exertion, peripheral edema.  Respiratory: Negative for dyspnea at rest, dyspnea on exertion, cough, sputum, wheezing.  GI: See history of present illness. GU:  Negative for dysuria, hematuria, urinary incontinence, urinary frequency, nocturnal urination.  MS: Negative for joint pain, low back pain.  Derm: Negative for rash or itching.  Neuro: Negative for weakness, abnormal sensation, seizure, frequent headaches, memory loss, confusion.  Psych: Negative for anxiety, depression, suicidal ideation, hallucinations.  Endo: Negative for unusual weight change.  Heme: Negative for bruising or bleeding. Allergy: Negative for rash or hives.    Physical Examination:  BP (!) 168/90 (BP Location: Right Arm, Cuff Size: Normal)   Pulse 69   Temp (!) 95.7 F (35.4 C) (Axillary)   Ht 5\' 2"  (1.575 m)   Wt 143 lb 6.4 oz (65 kg)   BMI 26.23 kg/m    General: Well-nourished, well-developed in no acute distress.  Head: Normocephalic, atraumatic.   Eyes: Conjunctiva pink, no icterus. Mouth: Oropharyngeal mucosa moist and pink , no lesions erythema or exudate. Neck: Supple without thyromegaly, masses, or lymphadenopathy.  Lungs: Clear to auscultation bilaterally.  Heart:  Regular rate and rhythm, no murmurs rubs or gallops.  Abdomen: Bowel sounds are normal, nontender, nondistended, no hepatosplenomegaly or masses, no abdominal bruits or    hernia , no rebound or guarding.   Rectal: not performed Extremities: No lower extremity edema. No clubbing or deformities.  Neuro: Alert and oriented x 4 , grossly normal neurologically.  Skin: Warm and dry, no rash or jaundice.   Psych: Alert and cooperative, normal mood and affect.   Imaging Studies: Mm Screening Breast Tomo Bilateral  Result Date: 05/13/2017 CLINICAL DATA:  Screening. EXAM: DIGITAL SCREENING BILATERAL MAMMOGRAM WITH TOMO AND CAD COMPARISON:  Previous exam(s). ACR Breast Density Category c: The breast tissue is heterogeneously dense, which may obscure small masses.  FINDINGS: There are no findings suspicious for malignancy. Images were processed with CAD. IMPRESSION: No mammographic evidence of malignancy. A result letter of this screening mammogram will be mailed directly to the patient. RECOMMENDATION: Screening mammogram in one year. (Code:SM-B-01Y) BI-RADS CATEGORY  1: Negative. Electronically Signed   By: Harmon Pier M.D.   On: 05/13/2017 16:21

## 2017-05-29 NOTE — Telephone Encounter (Signed)
-----   Message from Remus LofflerAngel S Jones, PA-C sent at 05/29/2017  3:17 PM EST ----- I sent lisinopril 10 mg one daily to her pharmacy. Please let her know.  Lawanna KobusAngel ----- Message ----- From: Tiffany KocherLewis, Leslie S, PA-C Sent: 05/29/2017   3:00 PM To: Tiffany KocherLeslie S Lewis, PA-C, Remus LofflerAngel S Jones, PA-C  [facility]! I saw this nice lady today for her dysphagia. She is being scheduled for upper endoscopy. Her blood pressure was significantly elevated today. 190/62 left arm and 168/90 right arm. The discrepancy was concerning. In addition, if her BP is elevated significantly the day of her endoscopy, her procedure will likely be cancelled.   I let the patient know that I am reaching out to you for assistance!  Leanna BattlesLeslie S. Melvyn NethLewis, PA-C

## 2017-05-29 NOTE — Assessment & Plan Note (Addendum)
Recent onset esophageal dysphagia occurring after consuming very spicy soup in 04/2017. Complains of hoarseness, sensation of lump in throat and now with vomiting after eating 4-5 tablespoons of solid food. She is maintaining nutrition with soft diet. FH of esophageal cancer as outlined. Patient has chronic GERD and no prior EGD. Recommended EGD/ED in near future with propofol with Dr. Jena Gaussourk.  I have discussed the risks, alternatives, benefits with regards to but not limited to the risk of reaction to medication, bleeding, infection, perforation and the patient is agreeable to proceed. Written consent to be obtained.  I have spoken with her PCP regarding elevated blood pressures today. She will contact patient is start medication.

## 2017-05-29 NOTE — Patient Instructions (Addendum)
1. Upper endoscopy as scheduled. See separate instructions.  2. Avoid bread, steak, rice, raw vegetables until your procedure. Finely chop chicken or other meats.  3. I will let Tabitha Fields know your blood pressures today. You may need low dose medication.

## 2017-05-30 ENCOUNTER — Telehealth: Payer: Self-pay | Admitting: *Deleted

## 2017-05-30 NOTE — Progress Notes (Signed)
cc'ed to pcp °

## 2017-05-30 NOTE — Telephone Encounter (Signed)
Spoke with pt and is aware of pre-op appt scheduled for 06/03/17 at 1:15pm. Discussed new instructions for EGD with patient in detail as times/date has changed from previously.

## 2017-05-31 NOTE — Patient Instructions (Signed)
Doristine LocksMargaret W Ivancic  05/31/2017     @PREFPERIOPPHARMACY @   Your procedure is scheduled on  06/06/2017 .  Report to [facility] at  1230   P.M.  Call this number if you have problems the morning of surgery:  [phone]   Remember:  Do not eat food or drink liquids after midnight.  Take these medicines the morning of surgery with A SIP OF WATER  Nexium, levothyroxine, lisinopril.   Do not wear jewelry, make-up or nail polish.  Do not wear lotions, powders, or perfumes, or deodorant.  Do not shave 48 hours prior to surgery.  Men may shave face and neck.  Do not bring valuables to the hospital.  [facility] is not responsible for any belongings or valuables.  Contacts, dentures or bridgework may not be worn into surgery.  Leave your suitcase in the car.  After surgery it may be brought to your room.  For patients admitted to the hospital, discharge time will be determined by your treatment team.  Patients discharged the day of surgery will not be allowed to drive home.   Name and phone number of your driver:   family Special instructions:  Follow the diet and prep instructions given to you by Dr Luvenia Starchourk's office.  Please read over the following fact sheets that you were given. Anesthesia Post-op Instructions and Care and Recovery After Surgery       Esophagogastroduodenoscopy Esophagogastroduodenoscopy (EGD) is a procedure to examine the lining of the esophagus, stomach, and first part of the small intestine (duodenum). This procedure is done to check for problems such as inflammation, bleeding, ulcers, or growths. During this procedure, a long, flexible, lighted tube with a camera attached (endoscope) is inserted down the throat. Tell a health care provider about:  Any allergies you have.  All medicines you are taking, including vitamins, herbs, eye drops, creams, and over-the-counter medicines.  Any problems you or family members have had with anesthetic  medicines.  Any blood disorders you have.  Any surgeries you have had.  Any medical conditions you have.  Whether you are pregnant or may be pregnant. What are the risks? Generally, this is a safe procedure. However, problems may occur, including:  Infection.  Bleeding.  A tear (perforation) in the esophagus, stomach, or duodenum.  Trouble breathing.  Excessive sweating.  Spasms of the larynx.  A slowed heartbeat.  Low blood pressure.  What happens before the procedure?  Follow instructions from your health care provider about eating or drinking restrictions.  Ask your health care provider about: ? Changing or stopping your regular medicines. This is especially important if you are taking diabetes medicines or blood thinners. ? Taking medicines such as aspirin and ibuprofen. These medicines can thin your blood. Do not take these medicines before your procedure if your health care provider instructs you not to.  Plan to have someone take you home after the procedure.  If you wear dentures, be ready to remove them before the procedure. What happens during the procedure?  To reduce your risk of infection, your health care team will wash or sanitize their hands.  An IV tube will be put in a vein in your hand or arm. You will get medicines and fluids through this tube.  You will be given one or more of the following: ? A medicine to help you relax (sedative). ? A medicine to numb the area (local anesthetic). This  medicine may be sprayed into your throat. It will make you feel more comfortable and keep you from gagging or coughing during the procedure. ? A medicine for pain.  A mouth guard may be placed in your mouth to protect your teeth and to keep you from biting on the endoscope.  You will be asked to lie on your left side.  The endoscope will be lowered down your throat into your esophagus, stomach, and duodenum.  Air will be put into the endoscope. This will  help your health care provider see better.  The lining of your esophagus, stomach, and duodenum will be examined.  Your health care provider may: ? Take a tissue sample so it can be looked at in a lab (biopsy). ? Remove growths. ? Remove objects (foreign bodies) that are stuck. ? Treat any bleeding with medicines or other devices that stop tissue from bleeding. ? Widen (dilate) or stretch narrowed areas of your esophagus and stomach.  The endoscope will be taken out. The procedure may vary among health care providers and hospitals. What happens after the procedure?  Your blood pressure, heart rate, breathing rate, and blood oxygen level will be monitored often until the medicines you were given have worn off.  Do not eat or drink anything until the numbing medicine has worn off and your gag reflex has returned. This information is not intended to replace advice given to you by your health care provider. Make sure you discuss any questions you have with your health care provider. Document Released: 07/20/2004 Document Revised: 08/25/2015 Document Reviewed: 02/10/2015 Elsevier Interactive Patient Education  2018 [employer]. Esophagogastroduodenoscopy, Care After Refer to this sheet in the next few weeks. These instructions provide you with information about caring for yourself after your procedure. Your health care provider may also give you more specific instructions. Your treatment has been planned according to current medical practices, but problems sometimes occur. Call your health care provider if you have any problems or questions after your procedure. What can I expect after the procedure? After the procedure, it is common to have:  A sore throat.  Nausea.  Bloating.  Dizziness.  Fatigue.  Follow these instructions at home:  Do not eat or drink anything until the numbing medicine (local anesthetic) has worn off and your gag reflex has returned. You will know that the  local anesthetic has worn off when you can swallow comfortably.  Do not drive for 24 hours if you received a medicine to help you relax (sedative).  If your health care provider took a tissue sample for testing during the procedure, make sure to get your test results. This is your responsibility. Ask your health care provider or the department performing the test when your results will be ready.  Keep all follow-up visits as told by your health care provider. This is important. Contact a health care provider if:  You cannot stop coughing.  You are not urinating.  You are urinating less than usual. Get help right away if:  You have trouble swallowing.  You cannot eat or drink.  You have throat or chest pain that gets worse.  You are dizzy or light-headed.  You faint.  You have nausea or vomiting.  You have chills.  You have a fever.  You have severe abdominal pain.  You have black, tarry, or bloody stools. This information is not intended to replace advice given to you by your health care provider. Make sure you discuss any questions you  have with your health care provider. Document Released: 03/05/2012 Document Revised: 08/25/2015 Document Reviewed: 02/10/2015 Elsevier Interactive Patient Education  2018 [employer].  Esophageal Dilatation Esophageal dilatation is a procedure to open a blocked or narrowed part of the esophagus. The esophagus is the long tube in your throat that carries food and liquid from your mouth to your stomach. The procedure is also called esophageal dilation. You may need this procedure if you have a buildup of scar tissue in your esophagus that makes it difficult, painful, or even impossible to swallow. This can be caused by gastroesophageal reflux disease (GERD). In rare cases, people need this procedure because they have cancer of the esophagus or a problem with the way food moves through the esophagus. Sometimes you may need to have another  dilatation to enlarge the opening of the esophagus gradually. Tell a health care provider about:  Any allergies you have.  All medicines you are taking, including vitamins, herbs, eye drops, creams, and over-the-counter medicines.  Any problems you or family members have had with anesthetic medicines.  Any blood disorders you have.  Any surgeries you have had.  Any medical conditions you have.  Any antibiotic medicines you are required to take before dental procedures. What are the risks? Generally, this is a safe procedure. However, problems can occur and include:  Bleeding from a tear in the lining of the esophagus.  A hole (perforation) in the esophagus.  What happens before the procedure?  Do not eat or drink anything after midnight on the night before the procedure or as directed by your health care provider.  Ask your health care provider about changing or stopping your regular medicines. This is especially important if you are taking diabetes medicines or blood thinners.  Plan to have someone take you home after the procedure. What happens during the procedure?  You will be given a medicine that makes you relaxed and sleepy (sedative).  A medicine may be sprayed or gargled to numb the back of the throat.  Your health care provider can use various instruments to do an esophageal dilatation. During the procedure, the instrument used will be placed in your mouth and passed down into your esophagus. Options include: ? Simple dilators. This instrument is carefully placed in the esophagus to stretch it. ? Guided wire bougies. In this method, a flexible tube (endoscope) is used to insert a wire into the esophagus. The dilator is passed over this wire to enlarge the esophagus. Then the wire is removed. ? Balloon dilators. An endoscope with a small balloon at the end is passed down into the esophagus. Inflating the balloon gently stretches the esophagus and opens it up. What  happens after the procedure?  Your blood pressure, heart rate, breathing rate, and blood oxygen level will be monitored often until the medicines you were given have worn off.  Your throat may feel slightly sore and will probably still feel numb. This will improve slowly over time.  You will not be allowed to eat or drink until the throat numbness has resolved.  If this is a same-day procedure, you may be allowed to go home once you have been able to drink, urinate, and sit on the edge of the bed without nausea or dizziness.  If this is a same-day procedure, you should have a friend or family member with you for the next 24 hours after the procedure. This information is not intended to replace advice given to you by your health care  provider. Make sure you discuss any questions you have with your health care provider. Document Released: 05/10/2005 Document Revised: 08/25/2015 Document Reviewed: 07/29/2013 Elsevier Interactive Patient Education  2018 [employer] Inc[employer]  Monitored Anesthesia Care Anesthesia is a term that refers to techniques, procedures, and medicines that help a person stay safe and comfortable during a medical procedure. Monitored anesthesia care, or sedation, is one type of anesthesia. Your anesthesia specialist may recommend sedation if you will be having a procedure that does not require you to be unconscious, such as:  Cataract surgery.  A dental procedure.  A biopsy.  A colonoscopy.  During the procedure, you may receive a medicine to help you relax (sedative). There are three levels of sedation:  Mild sedation. At this level, you may feel awake and relaxed. You will be able to follow directions.  Moderate sedation. At this level, you will be sleepy. You may not remember the procedure.  Deep sedation. At this level, you will be asleep. You will not remember the procedure.  The more medicine you are given, the deeper your level of sedation will be. Depending on how  you respond to the procedure, the anesthesia specialist may change your level of sedation or the type of anesthesia to fit your needs. An anesthesia specialist will monitor you closely during the procedure. Let your health care provider know about:  Any allergies you have.  All medicines you are taking, including vitamins, herbs, eye drops, creams, and over-the-counter medicines.  Any use of steroids (by mouth or as a cream).  Any problems you or family members have had with sedatives and anesthetic medicines.  Any blood disorders you have.  Any surgeries you have had.  Any medical conditions you have, such as sleep apnea.  Whether you are pregnant or may be pregnant.  Any use of cigarettes, alcohol, or street drugs. What are the risks? Generally, this is a safe procedure. However, problems may occur, including:  Getting too much medicine (oversedation).  Nausea.  Allergic reaction to medicines.  Trouble breathing. If this happens, a breathing tube may be used to help with breathing. It will be removed when you are awake and breathing on your own.  Heart trouble.  Lung trouble.  Before the procedure Staying hydrated Follow instructions from your health care provider about hydration, which may include:  Up to 2 hours before the procedure - you may continue to drink clear liquids, such as water, clear fruit juice, black coffee, and plain tea.  Eating and drinking restrictions Follow instructions from your health care provider about eating and drinking, which may include:  8 hours before the procedure - stop eating heavy meals or foods such as meat, fried foods, or fatty foods.  6 hours before the procedure - stop eating light meals or foods, such as toast or cereal.  6 hours before the procedure - stop drinking milk or drinks that contain milk.  2 hours before the procedure - stop drinking clear liquids.  Medicines Ask your health care provider about:  Changing or  stopping your regular medicines. This is especially important if you are taking diabetes medicines or blood thinners.  Taking medicines such as aspirin and ibuprofen. These medicines can thin your blood. Do not take these medicines before your procedure if your health care provider instructs you not to.  Tests and exams  You will have a physical exam.  You may have blood tests done to show: ? How well your kidneys and liver are working. ?  How well your blood can clot.  General instructions  Plan to have someone take you home from the hospital or clinic.  If you will be going home right after the procedure, plan to have someone with you for 24 hours.  What happens during the procedure?  Your blood pressure, heart rate, breathing, level of pain and overall condition will be monitored.  An IV tube will be inserted into one of your veins.  Your anesthesia specialist will give you medicines as needed to keep you comfortable during the procedure. This may mean changing the level of sedation.  The procedure will be performed. After the procedure  Your blood pressure, heart rate, breathing rate, and blood oxygen level will be monitored until the medicines you were given have worn off.  Do not drive for 24 hours if you received a sedative.  You may: ? Feel sleepy, clumsy, or nauseous. ? Feel forgetful about what happened after the procedure. ? Have a sore throat if you had a breathing tube during the procedure. ? Vomit. This information is not intended to replace advice given to you by your health care provider. Make sure you discuss any questions you have with your health care provider. Document Released: 12/13/2004 Document Revised: 08/26/2015 Document Reviewed: 07/10/2015 Elsevier Interactive Patient Education  2018 [employer], Care After These instructions provide you with information about caring for yourself after your procedure. Your health care  provider may also give you more specific instructions. Your treatment has been planned according to current medical practices, but problems sometimes occur. Call your health care provider if you have any problems or questions after your procedure. What can I expect after the procedure? After your procedure, it is common to:  Feel sleepy for several hours.  Feel clumsy and have poor balance for several hours.  Feel forgetful about what happened after the procedure.  Have poor judgment for several hours.  Feel nauseous or vomit.  Have a sore throat if you had a breathing tube during the procedure.  Follow these instructions at home: For at least 24 hours after the procedure:   Do not: ? Participate in activities in which you could fall or become injured. ? Drive. ? Use heavy machinery. ? Drink alcohol. ? Take sleeping pills or medicines that cause drowsiness. ? Make important decisions or sign legal documents. ? Take care of children on your own.  Rest. Eating and drinking  Follow the diet that is recommended by your health care provider.  If you vomit, drink water, juice, or soup when you can drink without vomiting.  Make sure you have little or no nausea before eating solid foods. General instructions  Have a responsible adult stay with you until you are awake and alert.  Take over-the-counter and prescription medicines only as told by your health care provider.  If you smoke, do not smoke without supervision.  Keep all follow-up visits as told by your health care provider. This is important. Contact a health care provider if:  You keep feeling nauseous or you keep vomiting.  You feel light-headed.  You develop a rash.  You have a fever. Get help right away if:  You have trouble breathing. This information is not intended to replace advice given to you by your health care provider. Make sure you discuss any questions you have with your health care  provider. Document Released: 07/10/2015 Document Revised: 11/09/2015 Document Reviewed: 07/10/2015 Elsevier Interactive Patient Education  [employer].

## 2017-06-03 ENCOUNTER — Encounter (HOSPITAL_COMMUNITY): Payer: Self-pay

## 2017-06-03 ENCOUNTER — Other Ambulatory Visit: Payer: Self-pay

## 2017-06-03 ENCOUNTER — Encounter (HOSPITAL_COMMUNITY)
Admission: RE | Admit: 2017-06-03 | Discharge: 2017-06-03 | Disposition: A | Discharge status: Home / Self Care | Payer: Medicare HMO | Source: Ambulatory Visit | Attending: Internal Medicine | Admitting: Internal Medicine

## 2017-06-03 DIAGNOSIS — E7800 Pure hypercholesterolemia, unspecified: Secondary | ICD-10-CM | POA: Diagnosis not present

## 2017-06-03 DIAGNOSIS — K449 Diaphragmatic hernia without obstruction or gangrene: Secondary | ICD-10-CM | POA: Diagnosis not present

## 2017-06-03 DIAGNOSIS — I10 Essential (primary) hypertension: Secondary | ICD-10-CM | POA: Diagnosis not present

## 2017-06-03 DIAGNOSIS — K219 Gastro-esophageal reflux disease without esophagitis: Secondary | ICD-10-CM | POA: Diagnosis not present

## 2017-06-03 DIAGNOSIS — Z800 Family history of malignant neoplasm of digestive organs: Secondary | ICD-10-CM | POA: Diagnosis not present

## 2017-06-03 DIAGNOSIS — R1110 Vomiting, unspecified: Secondary | ICD-10-CM | POA: Diagnosis not present

## 2017-06-03 DIAGNOSIS — Z79899 Other long term (current) drug therapy: Secondary | ICD-10-CM | POA: Diagnosis not present

## 2017-06-03 DIAGNOSIS — Z7982 Long term (current) use of aspirin: Secondary | ICD-10-CM | POA: Diagnosis not present

## 2017-06-03 DIAGNOSIS — Z01812 Encounter for preprocedural laboratory examination: Secondary | ICD-10-CM | POA: Diagnosis not present

## 2017-06-03 DIAGNOSIS — R1310 Dysphagia, unspecified: Secondary | ICD-10-CM | POA: Diagnosis not present

## 2017-06-03 DIAGNOSIS — E039 Hypothyroidism, unspecified: Secondary | ICD-10-CM | POA: Diagnosis not present

## 2017-06-03 DIAGNOSIS — Z79890 Hormone replacement therapy (postmenopausal): Secondary | ICD-10-CM | POA: Diagnosis not present

## 2017-06-03 HISTORY — DX: Essential (primary) hypertension: I10

## 2017-06-03 HISTORY — DX: Hypothyroidism, unspecified: E03.9

## 2017-06-03 LAB — BASIC METABOLIC PANEL
ANION GAP: 11 (ref 5–15)
BUN: 11 mg/dL (ref 6–20)
CALCIUM: 10.1 mg/dL (ref 8.9–10.3)
CO2: 25 mmol/L (ref 22–32)
CREATININE: 0.59 mg/dL (ref 0.44–1.00)
Chloride: 102 mmol/L (ref 101–111)
GFR calc Af Amer: >60 mL/min (ref >60)
GLUCOSE: 82 mg/dL (ref 65–99)
Potassium: 3.8 mmol/L (ref 3.5–5.1)
Sodium: 138 mmol/L (ref 135–145)

## 2017-06-03 LAB — CBC WITH DIFFERENTIAL/PLATELET
BASOS ABS: 0 10*3/uL (ref 0.0–0.1)
Basophils Relative: 0 %
EOS PCT: 1 %
Eosinophils Absolute: 0.1 10*3/uL (ref 0.0–0.7)
HCT: 41.3 % (ref 36.0–46.0)
Hemoglobin: 13.5 g/dL (ref 12.0–15.0)
LYMPHS PCT: 27 %
Lymphs Abs: 1.4 10*3/uL (ref 0.7–4.0)
MCH: 28.9 pg (ref 26.0–34.0)
MCHC: 32.7 g/dL (ref 30.0–36.0)
MCV: 88.4 fL (ref 78.0–100.0)
MONO ABS: 0.3 10*3/uL (ref 0.1–1.0)
Monocytes Relative: 6 %
Neutro Abs: 3.3 10*3/uL (ref 1.7–7.7)
Neutrophils Relative %: 66 %
PLATELETS: 217 10*3/uL (ref 150–400)
RBC: 4.67 MIL/uL (ref 3.87–5.11)
RDW: 12.7 % (ref 11.5–15.5)
WBC: 5.1 10*3/uL (ref 4.0–10.5)

## 2017-06-06 ENCOUNTER — Ambulatory Visit (HOSPITAL_COMMUNITY): Payer: Medicare HMO | Admitting: Anesthesiology

## 2017-06-06 ENCOUNTER — Ambulatory Visit (HOSPITAL_COMMUNITY)
Admission: RE | Admit: 2017-06-06 | Discharge: 2017-06-06 | Disposition: A | Discharge status: Home / Self Care | Payer: Medicare HMO | Source: Ambulatory Visit | Attending: Internal Medicine | Admitting: Internal Medicine

## 2017-06-06 ENCOUNTER — Encounter (HOSPITAL_COMMUNITY)
Admission: RE | Disposition: A | Discharge status: Home / Self Care | Payer: Self-pay | Source: Ambulatory Visit | Attending: Internal Medicine

## 2017-06-06 ENCOUNTER — Encounter (HOSPITAL_COMMUNITY): Payer: Self-pay | Admitting: *Deleted

## 2017-06-06 DIAGNOSIS — E7800 Pure hypercholesterolemia, unspecified: Secondary | ICD-10-CM | POA: Insufficient documentation

## 2017-06-06 DIAGNOSIS — Z01812 Encounter for preprocedural laboratory examination: Secondary | ICD-10-CM | POA: Insufficient documentation

## 2017-06-06 DIAGNOSIS — K449 Diaphragmatic hernia without obstruction or gangrene: Secondary | ICD-10-CM | POA: Diagnosis not present

## 2017-06-06 DIAGNOSIS — R1310 Dysphagia, unspecified: Secondary | ICD-10-CM | POA: Diagnosis not present

## 2017-06-06 DIAGNOSIS — I10 Essential (primary) hypertension: Secondary | ICD-10-CM | POA: Diagnosis not present

## 2017-06-06 DIAGNOSIS — K219 Gastro-esophageal reflux disease without esophagitis: Secondary | ICD-10-CM | POA: Diagnosis not present

## 2017-06-06 DIAGNOSIS — Z79899 Other long term (current) drug therapy: Secondary | ICD-10-CM | POA: Insufficient documentation

## 2017-06-06 DIAGNOSIS — Z800 Family history of malignant neoplasm of digestive organs: Secondary | ICD-10-CM | POA: Diagnosis not present

## 2017-06-06 DIAGNOSIS — Z79890 Hormone replacement therapy (postmenopausal): Secondary | ICD-10-CM | POA: Insufficient documentation

## 2017-06-06 DIAGNOSIS — E039 Hypothyroidism, unspecified: Secondary | ICD-10-CM | POA: Diagnosis not present

## 2017-06-06 DIAGNOSIS — R1110 Vomiting, unspecified: Secondary | ICD-10-CM | POA: Diagnosis not present

## 2017-06-06 DIAGNOSIS — Z7982 Long term (current) use of aspirin: Secondary | ICD-10-CM | POA: Diagnosis not present

## 2017-06-06 HISTORY — PX: MALONEY DILATION: SHX5535

## 2017-06-06 HISTORY — PX: ESOPHAGOGASTRODUODENOSCOPY (EGD) WITH PROPOFOL: SHX5813

## 2017-06-06 SURGERY — ESOPHAGOGASTRODUODENOSCOPY (EGD) WITH PROPOFOL
Anesthesia: Monitor Anesthesia Care

## 2017-06-06 MED ORDER — MIDAZOLAM HCL 2 MG/2ML IJ SOLN
INTRAMUSCULAR | Status: AC
  Filled 2017-06-06: qty 2

## 2017-06-06 MED ORDER — PROPOFOL 500 MG/50ML IV EMUL
INTRAVENOUS | Status: DC | PRN
  Administered 2017-06-06: 150 ug/kg/min via INTRAVENOUS

## 2017-06-06 MED ORDER — LACTATED RINGERS IV SOLN
INTRAVENOUS | Status: DC
  Administered 2017-06-06: 14:00:00 via INTRAVENOUS

## 2017-06-06 MED ORDER — LIDOCAINE VISCOUS 2 % MT SOLN
OROMUCOSAL | Status: AC
  Filled 2017-06-06: qty 15

## 2017-06-06 MED ORDER — LIDOCAINE VISCOUS 2 % MT SOLN: 5.0000 mL | Freq: Once | OROMUCOSAL | Status: DC

## 2017-06-06 MED ORDER — FENTANYL CITRATE (PF) 100 MCG/2ML IJ SOLN
INTRAMUSCULAR | Status: AC
Start: 2017-06-06 — End: 2017-06-06
  Filled 2017-06-06: qty 2

## 2017-06-06 MED ORDER — FENTANYL CITRATE (PF) 100 MCG/2ML IJ SOLN
25.0000 ug | Freq: Once | INTRAMUSCULAR | Status: AC
  Administered 2017-06-06: 25 ug via INTRAVENOUS

## 2017-06-06 MED ORDER — MIDAZOLAM HCL 2 MG/2ML IJ SOLN
1.0000 mg | INTRAMUSCULAR | Status: AC
  Administered 2017-06-06: 2 mg via INTRAVENOUS

## 2017-06-06 NOTE — Anesthesia Postprocedure Evaluation (Signed)
Anesthesia Post Note  Patient: Tabitha Fields  Procedure(s) Performed: ESOPHAGOGASTRODUODENOSCOPY (EGD) WITH PROPOFOL (N/A ) MALONEY DILATION (N/A )  Patient location during evaluation: PACU Anesthesia Type: MAC Level of consciousness: awake and alert and oriented Pain management: pain level controlled Vital Signs Assessment: post-procedure vital signs reviewed and stable Respiratory status: spontaneous breathing Cardiovascular status: blood pressure returned to baseline Postop Assessment: no apparent nausea or vomiting Anesthetic complications: no     Last Vitals:  Vitals:   06/06/17 1630 06/06/17 1632  BP: (!) 141/77 (!) 151/79  Pulse: 71 72  Resp: (!) 26 20  Temp:  36.5 C  SpO2: 95% 97%    Last Pain:  Vitals:   06/06/17 1632  TempSrc: Oral                 Jesika Men

## 2017-06-06 NOTE — Op Note (Addendum)
[facility] Patient Name: Tabitha PacificMargaret Hammond Procedure Date: 06/06/2017 3:29 PM MRN: [id number] Date of Birth: 1945/08/06 Attending MD: Gennette Pacobert Michael Roni Scow , MD CSN: [id number] Age: [age] Admit Type: Outpatient Procedure:                Upper GI endoscopy Indications:              Dysphagia Providers:                Gennette Pacobert Michael Aldean Pipe, MD, Criselda PeachesLurae B. Patsy LagerAlbert RN, RN,                            Nena PolioLisa Moore, RN Referring MD:              Medicines:                Propofol per Anesthesia Complications:            No immediate complications. Estimated Blood Loss:     Estimated blood loss was minimal. Procedure:                Pre-Anesthesia Assessment:                           - Prior to the procedure, a History and Physical                            was performed, and patient medications and                            allergies were reviewed. The patient's tolerance of                            previous anesthesia was also reviewed. The risks                            and benefits of the procedure and the sedation                            options and risks were discussed with the patient.                            All questions were answered, and informed consent                            was obtained. Prior Anticoagulants: The patient has                            taken no previous anticoagulant or antiplatelet                            agents. ASA Grade Assessment: II - A patient with                            mild systemic disease. After reviewing the risks  and benefits, the patient was deemed in                            satisfactory condition to undergo the procedure.                           After obtaining informed consent, the endoscope was                            passed under direct vision. Throughout the                            procedure, the patient's blood pressure, pulse, and                            oxygen saturations were  monitored continuously. The                            [id number] ([id number]) scope was introduced through the                            and advanced to the second part of duodenum. The                            upper GI endoscopy was accomplished without                            difficulty. The patient tolerated the procedure                            fairly well. Scope In: 3:52:03 PM Scope Out: 3:59:56 PM Total Procedure Duration: 0 hours 7 minutes 53 seconds  Findings:      Aside from a slightly baggy, atonic tubular esophagus, the esophagus       appeared normal. Was widely patent..      A large hiatal hernia was present.      The duodenal bulb and second portion of the duodenum were normal. The       scope was withdrawn. Dilation was performed with a Maloney dilator with       moderate resistance at 56 Fr. The dilation site was examined following       endoscope reinsertion and showed no change. Impression:               - dilated, atonic but otherwise normall esophagus.                            Dilated.                           - Large hiatal hernia.                           - Normal duodenal bulb and second portion of the  duodenum.                           - No specimens collected. Moderate Sedation:      Moderate (conscious) sedation was personally administered by an       anesthesia professional. The following parameters were monitored: oxygen       saturation, heart rate, blood pressure, respiratory rate, EKG, adequacy       of pulmonary ventilation, and response to care. Total physician       intraservice time was 14 minutes. Recommendation:           - Patient has a contact number available for                            emergencies. The signs and symptoms of potential                            delayed complications were discussed with the                            patient. Return to normal activities tomorrow.                             Written discharge instructions were provided to the                            patient.                           - Resume previous diet.                           - Continue present medications. Continue Nexium 40                            mg twice daily. Office visit in 6 weeks. If                            dysphagia not markedly improved, would consider the                            possibility of underlying esophageal motility                            disorder.                           - Procedure Code(s):        --- Professional ---                           [phone], Esophagogastroduodenoscopy, flexible,                            transoral; diagnostic, including collection of  specimen(s) by brushing or washing, when performed                            (separate procedure)                           43450, Dilation of esophagus, by unguided sound or                            bougie, single or multiple passes Diagnosis Code(s):        --- Professional ---                           K44.9, Diaphragmatic hernia without obstruction or                            gangrene                           R13.10, Dysphagia, unspecified CPT copyright 2016 American Medical Association. All rights reserved. The codes documented in this report are preliminary and upon coder review may  be revised to meet current compliance requirements. Gerrit Friends. Analeise Mccleery, MD Gennette Pac, MD 06/06/2017 4:06:38 PM This report has been signed electronically. Number of Addenda: 0

## 2017-06-06 NOTE — Interval H&P Note (Signed)
History and Physical Interval Note:  06/06/2017 3:32 PM  Tabitha Fields  has presented today for surgery, with the diagnosis of GERD/DYSPHAGIA  The various methods of treatment have been discussed with the patient and family. After consideration of risks, benefits and other options for treatment, the patient has consented to  Procedure(s) with comments: ESOPHAGOGASTRODUODENOSCOPY (EGD) WITH PROPOFOL (N/A) - 2:30pm MALONEY DILATION (N/A) as a surgical intervention .  The patient's history has been reviewed, patient examined, no change in status, stable for surgery.  I have reviewed the patient's chart and labs.  Questions were answered to the patient's satisfaction.     Tabitha Fields  No change. EGD with possible esophageal dilation as feasible/appropriate per plan.  You should have another colonoscopy in 10 years. The risks, benefits, limitations, alternatives and imponderables have been reviewed with the patient. Potential for esophageal dilation, biopsy, etc. have also been reviewed.  Questions have been answered. All parties agreeable.

## 2017-06-06 NOTE — Anesthesia Preprocedure Evaluation (Signed)
Anesthesia Evaluation  Patient identified by MRN, date of birth, ID band Patient awake    Reviewed: Allergy & Precautions, NPO status , Patient's Chart, lab work & pertinent test results  Airway Mallampati: III  TM Distance: <3 FB Neck ROM: Full    Dental  (+) Edentulous Upper   Pulmonary neg pulmonary ROS,    breath sounds clear to auscultation       Cardiovascular hypertension, Pt. on medications  Rhythm:Regular Rate:Normal     Neuro/Psych negative neurological ROS  negative psych ROS   GI/Hepatic Neg liver ROS, GERD  Medicated,  Endo/Other  Hypothyroidism   Renal/GU negative Renal ROS     Musculoskeletal   Abdominal   Peds  Hematology   Anesthesia Other Findings   Reproductive/Obstetrics                             Anesthesia Physical Anesthesia Plan  ASA: III  Anesthesia Plan: MAC   Post-op Pain Management:    Induction: Intravenous  PONV Risk Score and Plan:   Airway Management Planned: Simple Face Mask  Additional Equipment:   Intra-op Plan:   Post-operative Plan:   Informed Consent: I have reviewed the patients History and Physical, chart, labs and discussed the procedure including the risks, benefits and alternatives for the proposed anesthesia with the patient or authorized representative who has indicated his/her understanding and acceptance.     Plan Discussed with:   Anesthesia Plan Comments:         Anesthesia Quick Evaluation

## 2017-06-06 NOTE — Discharge Instructions (Addendum)
EGD Discharge instructions Please read the instructions outlined below and refer to this sheet in the next few weeks. These discharge instructions provide you with general information on caring for yourself after you leave the hospital. Your doctor may also give you specific instructions. While your treatment has been planned according to the most current medical practices available, unavoidable complications occasionally occur. If you have any problems or questions after discharge, please call your doctor. ACTIVITY  You may resume your regular activity but move at a slower pace for the next 24 hours.   Take frequent rest periods for the next 24 hours.   Walking will help expel (get rid of) the air and reduce the bloated feeling in your abdomen.   No driving for 24 hours (because of the anesthesia (medicine) used during the test).   You may shower.   Do not sign any important legal documents or operate any machinery for 24 hours (because of the anesthesia used during the test).  NUTRITION  Drink plenty of fluids.   You may resume your normal diet.   Begin with a light meal and progress to your normal diet.   Avoid alcoholic beverages for 24 hours or as instructed by your caregiver.  MEDICATIONS  You may resume your normal medications unless your caregiver tells you otherwise.  WHAT YOU CAN EXPECT TODAY  You may experience abdominal discomfort such as a feeling of fullness or gas pains.  FOLLOW-UP  Your doctor will discuss the results of your test with you.  SEEK IMMEDIATE MEDICAL ATTENTION IF ANY OF THE FOLLOWING OCCUR:  Excessive nausea (feeling sick to your stomach) and/or vomiting.   Severe abdominal pain and distention (swelling).   Trouble swallowing.   Temperature over 101 F (37.8 C).   Rectal bleeding or vomiting of blood.    Continue Nexium 40 mg twice daily  Office visit with [place] in 6 weeks     PATIENT INSTRUCTIONS POST-ANESTHESIA  IMMEDIATELY  FOLLOWING SURGERY:  Do not drive or operate machinery for the first twenty four hours after surgery.  Do not make any important decisions for twenty four hours after surgery or while taking narcotic pain medications or sedatives.  If you develop intractable nausea and vomiting or a severe headache please notify your doctor immediately.  FOLLOW-UP:  Please make an appointment with your surgeon as instructed. You do not need to follow up with anesthesia unless specifically instructed to do so.  WOUND CARE INSTRUCTIONS (if applicable):  Keep a dry clean dressing on the anesthesia/puncture wound site if there is drainage.  Once the wound has quit draining you may leave it open to air.  Generally you should leave the bandage intact for twenty four hours unless there is drainage.  If the epidural site drains for more than 36-48 hours please call the anesthesia department.  QUESTIONS?:  Please feel free to call your physician or the hospital operator if you have any questions, and they will be happy to assist you.

## 2017-06-06 NOTE — Transfer of Care (Signed)
Immediate Anesthesia Transfer of Care Note  Patient: Aerin Delany Mcbreen  Procedure(s) Performed: ESOPHAGOGASTRODUODENOSCOPY (EGD) WITH PROPOFOL (N/A ) MALONEY DILATION (N/A )  Patient Location: PACU  Anesthesia Type:MAC  Level of Consciousness: awake, alert  and oriented  Airway & Oxygen Therapy: Patient Spontanous Breathing and Patient connected to nasal cannula oxygen  Post-op Assessment: Report given to RN  Post vital signs: Reviewed and stable  Last Vitals:  Vitals:   06/06/17 1525 06/06/17 1607  BP: (!) 129/53 112/62  Pulse:  74  Resp: (!) 24 (!) 24  Temp:  36.6 C  SpO2: 98% 95%    Last Pain:  Vitals:   06/06/17 1322  TempSrc: Oral         Complications: No apparent anesthesia complications

## 2017-06-07 NOTE — Addendum Note (Signed)
Addendum  created 06/07/17 0957 by Moshe Salisburyaniel, Lynette Topete E, CRNA   Charge Capture section accepted

## 2017-06-11 ENCOUNTER — Encounter (HOSPITAL_COMMUNITY): Payer: Self-pay | Admitting: Internal Medicine

## 2017-06-19 ENCOUNTER — Encounter (HOSPITAL_COMMUNITY): Payer: Self-pay

## 2017-06-19 ENCOUNTER — Ambulatory Visit (HOSPITAL_COMMUNITY): Admit: 2017-06-19 | Payer: Medicare HMO | Admitting: Internal Medicine

## 2017-06-19 ENCOUNTER — Other Ambulatory Visit: Payer: Self-pay | Admitting: Physician Assistant

## 2017-06-19 ENCOUNTER — Encounter: Payer: Self-pay | Admitting: Physician Assistant

## 2017-06-19 DIAGNOSIS — R1310 Dysphagia, unspecified: Secondary | ICD-10-CM

## 2017-06-19 DIAGNOSIS — R1319 Other dysphagia: Secondary | ICD-10-CM

## 2017-06-19 DIAGNOSIS — K2100 Gastro-esophageal reflux disease with esophagitis, without bleeding: Secondary | ICD-10-CM

## 2017-06-19 SURGERY — EGD (ESOPHAGOGASTRODUODENOSCOPY)
Anesthesia: Moderate Sedation

## 2017-06-19 MED ORDER — ESOMEPRAZOLE MAGNESIUM 40 MG PO CPDR: 40.0000 mg | DELAYED_RELEASE_CAPSULE | Freq: Two times a day (BID) | ORAL | 11 refills | Status: DC

## 2017-06-24 ENCOUNTER — Encounter: Payer: Self-pay | Admitting: Physician Assistant

## 2017-07-18 ENCOUNTER — Encounter: Payer: Self-pay | Admitting: Gastroenterology

## 2017-07-18 ENCOUNTER — Ambulatory Visit: Payer: Medicare HMO | Admitting: Gastroenterology

## 2017-07-18 VITALS — BP 154/79 | HR 80 | Temp 97.1°F | Ht 62.0 in | Wt 142.8 lb

## 2017-07-18 DIAGNOSIS — K449 Diaphragmatic hernia without obstruction or gangrene: Secondary | ICD-10-CM | POA: Insufficient documentation

## 2017-07-18 DIAGNOSIS — R1319 Other dysphagia: Secondary | ICD-10-CM

## 2017-07-18 DIAGNOSIS — R1310 Dysphagia, unspecified: Secondary | ICD-10-CM

## 2017-07-18 DIAGNOSIS — K219 Gastro-esophageal reflux disease without esophagitis: Secondary | ICD-10-CM

## 2017-07-18 NOTE — Assessment & Plan Note (Signed)
Clinically she is doing very well at this time.  Noted to have large hiatal hernia and somewhat baggy, atonic tubular esophagus.  She felt like the she feels like esophageal dilation has made a huge difference.  We discussed possibility of underlying esophageal motility disorder.  At this time because she is doing very well we will continue to monitor symptoms.  Discussed swallowing precautions.  Continue Nexium twice daily for now.  Would encourage her to wean off of Carafate.  If she has any recurrent symptoms when weaning off of Carafate she will let me know.  We will plan to see her back in 6 months or she will call sooner if needed.

## 2017-07-18 NOTE — Patient Instructions (Signed)
1. Try cutting back on sucralfate two twice a day. If tolerate, then continue to wean until you are off.  2. Continue Nexium twice daily for now.  3. Return to the office in six months. Call sooner if needed.

## 2017-07-18 NOTE — Progress Notes (Signed)
cc'ed to pcp °

## 2017-07-18 NOTE — Progress Notes (Signed)
Primary Care Physician: Remus Loffler, PA-C  Primary Gastroenterologist:  Roetta Sessions, MD   Chief Complaint  Patient presents with  . Dysphagia    improved since procedure. Watching what she eats/chewing really goodf    HPI: Tabitha Fields is a 72 y.o. female here for follow-up.  She was seen back in February with solid food dysphagia, hoarseness, vomiting.  Symptoms began after eating tomato-based soup at a [employer].  She reports the soup tasted like Tabasco sauce, hot and spicy.  She had been off Nexium but even on twice daily dosing symptoms persisted.  EGD performed last month and showed dilated, atonic otherwise normal esophagus.  She was empirically dilated.  She had a large hiatal hernia.  Otherwise stomach appeared normal.  Clinically she is doing very well.  She states her swallowing has dramatically improved.  She is very pleased.  No longer having heartburn or odynophagia.  No abdominal pain.  Bowel function is good with the use of Metamucil and MiraLAX.  She continues to take Nexium twice daily.  Is also on Carafate 4 times a day still.  Denies melena or rectal bleeding.  Current Outpatient Medications  Medication Sig Dispense Refill  . aspirin EC 81 MG tablet Take 81 mg by mouth daily.    . calcium carbonate 1250 MG capsule Take 1,250 mg by mouth daily.     . Coenzyme Q10 (CO Q 10) 100 MG CAPS Take 200 mg by mouth daily.    Marland Kitchen esomeprazole (NEXIUM) 40 MG capsule Take 1 capsule (40 mg total) by mouth 2 (two) times daily before a meal. 60 capsule 11  . levothyroxine (SYNTHROID, LEVOTHROID) 100 MCG tablet Take 1 tablet (100 mcg total) by mouth daily. (Patient taking differently: Take 100 mcg by mouth daily before breakfast. ) 90 tablet 1  . lisinopril (PRINIVIL,ZESTRIL) 10 MG tablet Take 1 tablet (10 mg total) by mouth daily. 90 tablet 0  . Multiple Vitamin (MULTIVITAMIN) capsule Take 1 capsule by mouth daily.    . Omega-3 Fatty Acids (FISH OIL) 1200 MG  CAPS Take 2 capsules by mouth daily.     . simvastatin (ZOCOR) 40 MG tablet Take 1 tablet (40 mg total) by mouth daily. 90 tablet 2  . sucralfate (CARAFATE) 1 g tablet Take 1 tablet (1 g total) by mouth 4 (four) times daily -  with meals and at bedtime. 120 tablet 2   No current facility-administered medications for this visit.     Allergies as of 07/18/2017  . (No Known Allergies)    ROS:  General: Negative for anorexia, weight loss, fever, chills, fatigue, weakness. ENT: Negative for hoarseness, difficulty swallowing , nasal congestion. CV: Negative for chest pain, angina, palpitations, dyspnea on exertion, peripheral edema.  Respiratory: Negative for dyspnea at rest, dyspnea on exertion, cough, sputum, wheezing.  GI: See history of present illness. GU:  Negative for dysuria, hematuria, urinary incontinence, urinary frequency, nocturnal urination.  Endo: Negative for unusual weight change.    Physical Examination:   BP (!) 154/79   Pulse 80   Temp (!) 97.1 F (36.2 C) (Oral)   Ht 5\' 2"  (1.575 m)   Wt 142 lb 12.8 oz (64.8 kg)   BMI 26.12 kg/m   General: Well-nourished, well-developed in no acute distress.  Eyes: No icterus. Mouth: Oropharyngeal mucosa moist and pink , no lesions erythema or exudate. Lungs: Clear to auscultation bilaterally.  Heart: Regular rate and rhythm, no murmurs rubs or gallops.  Abdomen: Bowel sounds are normal, nontender, nondistended, no hepatosplenomegaly or masses, no abdominal bruits or hernia , no rebound or guarding.   Extremities: No lower extremity edema. No clubbing or deformities. Neuro: Alert and oriented x 4   Skin: Warm and dry, no jaundice.   Psych: Alert and cooperative, normal mood and affect.  Labs:  Lab Results  Component Value Date   CREATININE 0.59 06/03/2017   BUN 11 06/03/2017   NA 138 06/03/2017   K 3.8 06/03/2017   CL 102 06/03/2017   CO2 25 06/03/2017    Lab Results  Component Value Date   WBC 5.1 06/03/2017    HGB 13.5 06/03/2017   HCT 41.3 06/03/2017   MCV 88.4 06/03/2017   PLT 217 06/03/2017    Imaging Studies: No results found.

## 2017-07-30 ENCOUNTER — Other Ambulatory Visit: Payer: Self-pay | Admitting: Physician Assistant

## 2017-07-30 DIAGNOSIS — K2100 Gastro-esophageal reflux disease with esophagitis, without bleeding: Secondary | ICD-10-CM

## 2017-07-30 DIAGNOSIS — R1310 Dysphagia, unspecified: Secondary | ICD-10-CM

## 2017-07-30 DIAGNOSIS — R1319 Other dysphagia: Secondary | ICD-10-CM

## 2017-08-12 ENCOUNTER — Encounter: Payer: Self-pay | Admitting: Physician Assistant

## 2017-08-13 ENCOUNTER — Other Ambulatory Visit: Payer: Self-pay | Admitting: Physician Assistant

## 2017-08-13 MED ORDER — LISINOPRIL 10 MG PO TABS: 10.0000 mg | ORAL_TABLET | Freq: Every day | ORAL | 0 refills | Status: DC

## 2017-08-16 ENCOUNTER — Other Ambulatory Visit: Payer: Self-pay | Admitting: Physician Assistant

## 2017-09-28 ENCOUNTER — Other Ambulatory Visit: Payer: Self-pay | Admitting: Physician Assistant

## 2017-11-01 ENCOUNTER — Other Ambulatory Visit: Payer: Self-pay | Admitting: Physician Assistant

## 2017-11-06 ENCOUNTER — Other Ambulatory Visit: Payer: Self-pay | Admitting: *Deleted

## 2017-11-06 MED ORDER — SIMVASTATIN 40 MG PO TABS: 40.0000 mg | ORAL_TABLET | Freq: Every day | ORAL | 0 refills | Status: DC

## 2017-12-04 ENCOUNTER — Other Ambulatory Visit: Payer: Self-pay | Admitting: Physician Assistant

## 2017-12-05 NOTE — Telephone Encounter (Signed)
Last seen 05/28/17  Angel 

## 2017-12-11 ENCOUNTER — Telehealth: Payer: Self-pay | Admitting: Physician Assistant

## 2017-12-18 ENCOUNTER — Ambulatory Visit: Payer: Medicare HMO | Admitting: Nurse Practitioner

## 2017-12-18 ENCOUNTER — Encounter: Payer: Self-pay | Admitting: Nurse Practitioner

## 2017-12-18 VITALS — BP 153/81 | HR 83 | Temp 97.2°F | Ht 62.0 in | Wt 146.0 lb

## 2017-12-18 DIAGNOSIS — K449 Diaphragmatic hernia without obstruction or gangrene: Secondary | ICD-10-CM | POA: Diagnosis not present

## 2017-12-18 DIAGNOSIS — K219 Gastro-esophageal reflux disease without esophagitis: Secondary | ICD-10-CM

## 2017-12-18 DIAGNOSIS — K2100 Gastro-esophageal reflux disease with esophagitis, without bleeding: Secondary | ICD-10-CM

## 2017-12-18 DIAGNOSIS — R1310 Dysphagia, unspecified: Secondary | ICD-10-CM

## 2017-12-18 DIAGNOSIS — R1319 Other dysphagia: Secondary | ICD-10-CM

## 2017-12-18 MED ORDER — ESOMEPRAZOLE MAGNESIUM 40 MG PO CPDR: 40.0000 mg | DELAYED_RELEASE_CAPSULE | Freq: Two times a day (BID) | ORAL | 11 refills | Status: DC

## 2017-12-18 NOTE — Assessment & Plan Note (Signed)
The patient wondered if her hiatal hernia could be contributing to her dysphasia and GERD symptoms.  She does have a "large hiatal hernia" last noted on upper endoscopy status post dilation.  This could be contributing to her symptoms.  I discussed that other than medications the remaining option to treat her hiatal hernia surgery.  She states she does not really want surgery.  We will continue to further try to manage her dysphasia and GERD as per below.  Follow-up in 3 months.

## 2017-12-18 NOTE — Patient Instructions (Signed)
1. Refilled your Nexium to your pharmacy.  Take this twice a day, on an empty stomach. 2. We will help schedule your swallowing test for you.   3. Continue your other current medications. 4. Follow-up in 3 months. 5. Call us if you have any questions or concerns.  At [facility] we value your feedback. You may receive a survey about your visit today. Please share your experience as we strive to create trusting relationships with our patients to provide genuine, compassionate, quality care.  We appreciate your understanding and patience as we review any laboratory studies, imaging, and other diagnostic tests that are ordered as we care for you. Our office policy is 5 business days for review of these results, and any emergent or urgent results are addressed in a timely manner for your best interest. If you do not hear from our office in 1 week, please contact us.   We also encourage the use of MyChart, which contains your medical information for your review as well. If you are not enrolled in this feature, an access code is on this after visit summary for your convenience. Thank you for allowing us to be involved in your care.  It was great to meet you today!  I hope you have a great Fall!!

## 2017-12-18 NOTE — Assessment & Plan Note (Signed)
Chronic history of GERD.  She took it upon herself to reduce her Nexium to once daily.  She had worsening symptoms since then.  She feels like stuff is "coming up from my stomach and causing phlegm like material" which is causing her symptoms.  I recommend she return to Nexium twice daily.  I will refill her prescription per her request.  Continue her other current medications, follow-up in 3 months.

## 2017-12-18 NOTE — Assessment & Plan Note (Signed)
The patient did have some improvement in dysphagia after her recent EGD with dilation.  However, she has not improved this much she would like.  She still has dysphasia symptoms essentially with every meal.  Half-time food passes, half time she has to regurgitate.  She typically has more symptoms that she is eating out which tends to have heavier foods at restaurants rather than compared to home.  Her last EGD did note dilated atonic esophagus which was empirically dilated.  At this point I do not think it is best to proceed with another EGD with dilation.  I will check a barium pill esophagram for esophageal function.  Recommend she continue with soft foods and avoid meats and other trigger foods.  Follow-up in 3 months.  Continue other current medications.

## 2017-12-18 NOTE — Progress Notes (Signed)
Referring Provider: Remus Loffler, PA-C Primary Care Physician:  Remus Loffler, PA-C Primary GI:  Dr. Jena Gauss  Chief Complaint  Patient presents with  . Dysphagia    HPI:   Tabitha Fields is a 72 y.o. female who presents for follow-up on dysphagia.  The patient was last seen in our office 07/18/2017 for the same as well as hiatal hernia and GERD.  EGD performed prior to her last visit showed dilated, atonic otherwise normal esophagus she was empirically dilated.  Noted large hiatal hernia otherwise normal.  At last visit she was doing well, swallowing dramatically improved and she is pleased.  No further heartburn or diet aphasia.  Bowel function good with Metamucil and MiraLAX.  Continues Nexium twice daily and Carafate 4 times a day.  No other GI symptoms.  Recommended reducing Carafate to 2 tabs twice daily and and continue to wean until off.  Continue Nexium twice daily, follow-up in 6 months.  Today she states she's doing ok overall. She is still having dysphagia, but somewhat better than prior to last EGD. She does have a large hiatal hernia. She has more problems when at a restaurant. Has "hanging up" of food intermittently. She reduced her Nexium to once daily. Has dysphagia with every meal if she isn't careful with what she eats. Tries to eat softer foods. Avoids meats. Foods will pass with foods; part of the time will have regurgitation, especially if she is at a restaurant. Denies abdominal pain, N/V, hematochezia, melena, fever, chills, unintentional weight loss. Is taking Mineral oil for constipation which works well for her. Denies chest pain, dyspnea, dizziness, lightheadedness, syncope, near syncope. Denies any other upper or lower GI symptoms.  Past Medical History:  Diagnosis Date  . GERD (gastroesophageal reflux disease)   . Hypercholesteremia   . Hypertension   . Hypothyroidism   . Thyroid disease     Past Surgical History:  Procedure Laterality Date  .  COLONOSCOPY  07/28/04   Dr. Betha Loa  . COLONOSCOPY  2011   Dr. Jena Gauss: anal canal hemorhoids, pancolonic diverticula, otherwise normal  . COLONOSCOPY N/A 03/17/2015   DR. Rourk: diverticulosis. consider one last tcs in 5 years for prior h/o tubular adenoma.  . ESOPHAGOGASTRODUODENOSCOPY (EGD) WITH PROPOFOL N/A 06/06/2017   Dr. Jena Gauss: Slightly baggy, atonic tubular esophagus which otherwise appeared normal, widely patent.  Dilated for history of dysphagia.  Large hiatal hernia.  Marland Kitchen HEMORROIDECTOMY    . MALONEY DILATION N/A 06/06/2017   Procedure: Elease Hashimoto DILATION;  Surgeon: Corbin Ade, MD;  Location: [facility];  Service: Endoscopy;  Laterality: N/A;  . TUBAL LIGATION      Current Outpatient Medications  Medication Sig Dispense Refill  . Ascorbic Acid (VITAMIN C PO) Take 1 tablet by mouth daily.    Marland Kitchen aspirin EC 81 MG tablet Take 81 mg by mouth daily.    . calcium carbonate 1250 MG capsule Take 1,250 mg by mouth daily.     . Coenzyme Q10 (CO Q 10) 100 MG CAPS Take 200 mg by mouth daily.    Marland Kitchen esomeprazole (NEXIUM) 40 MG capsule Take 1 capsule (40 mg total) by mouth 2 (two) times daily before a meal. (Patient taking differently: Take 40 mg by mouth daily. ) 60 capsule 11  . lactose free nutrition (BOOST) LIQD Take 237 mLs by mouth daily as needed.    Marland Kitchen levothyroxine (SYNTHROID, LEVOTHROID) 100 MCG tablet TAKE ONE TABLET BY MOUTH EVERY DAY 90 tablet 1  .  lisinopril (PRINIVIL,ZESTRIL) 10 MG tablet TAKE 1 TABLET BY MOUTH EVERY DAY 30 tablet 5  . Multiple Vitamin (MULTIVITAMIN) capsule Take 1 capsule by mouth daily.    . Omega-3 Fatty Acids (FISH OIL) 1200 MG CAPS Take 2 capsules by mouth daily.     . simvastatin (ZOCOR) 40 MG tablet Take 1 tablet (40 mg total) by mouth daily. 30 tablet 0   No current facility-administered medications for this visit.     Allergies as of 12/18/2017  . (No Known Allergies)    Family History  Problem Relation Age of Onset  . Esophageal cancer Brother  [id number]  . Colon cancer Neg Hx     Social History   Socioeconomic History  . Marital status: Widowed    Spouse name: Not on file  . Number of children: Not on file  . Years of education: Not on file  . Highest education level: Not on file  Occupational History  . Not on file  Social Needs  . Financial resource strain: Not on file  . Food insecurity:    Worry: Not on file    Inability: Not on file  . Transportation needs:    Medical: Not on file    Non-medical: Not on file  Tobacco Use  . Smoking status: Never Smoker  . Smokeless tobacco: Never Used  Substance and Sexual Activity  . Alcohol use: No    Alcohol/week: 0.0 standard drinks  . Drug use: No  . Sexual activity: Not Currently    Birth control/protection: Post-menopausal  Lifestyle  . Physical activity:    Days per week: Not on file    Minutes per session: Not on file  . Stress: Not on file  Relationships  . Social connections:    Talks on phone: Not on file    Gets together: Not on file    Attends religious service: Not on file    Active member of club or organization: Not on file    Attends meetings of clubs or organizations: Not on file    Relationship status: Not on file  Other Topics Concern  . Not on file  Social History Narrative  . Not on file    Review of Systems: General: Negative for anorexia, weight loss, fever, chills, fatigue, weakness. ENT: Negative for hoarseness. Admits dysphagia. CV: Negative for chest pain, angina, palpitations, peripheral edema.  Respiratory: Negative for dyspnea at rest, cough, sputum, wheezing.  GI: See history of present illness. Endo: Negative for unusual weight change.  Heme: Negative for bruising or bleeding.   Physical Exam: BP (!) 153/81   Pulse 83   Temp (!) 97.2 F (36.2 C) (Oral)   Ht 5\' 2"  (1.575 m)   Wt 146 lb (66.2 kg)   BMI 26.70 kg/m  General:   Alert and oriented. Pleasant and cooperative. Well-nourished and well-developed.  Eyes:  Without  icterus, sclera clear and conjunctiva pink.  Ears:  Normal auditory acuity. Cardiovascular:  S1, S2 present without murmurs appreciated. Extremities without clubbing or edema. Respiratory:  Clear to auscultation bilaterally. No wheezes, rales, or rhonchi. No distress.  Gastrointestinal:  +BS, soft, non-tender and non-distended. No HSM noted. No guarding or rebound. No masses appreciated.  Rectal:  Deferred  Musculoskalatal:  Symmetrical without gross deformities. Neurologic:  Alert and oriented x4;  grossly normal neurologically. Psych:  Alert and cooperative. Normal mood and affect. Heme/Lymph/Immune: No excessive bruising noted.    12/18/2017 1:36 PM   Disclaimer: This note was dictated with voice  recognition software. Similar sounding words can inadvertently be transcribed and may not be corrected upon review.

## 2017-12-18 NOTE — Progress Notes (Signed)
CC'D TO PCP °

## 2017-12-20 DIAGNOSIS — R69 Illness, unspecified: Secondary | ICD-10-CM | POA: Diagnosis not present

## 2017-12-23 ENCOUNTER — Ambulatory Visit (HOSPITAL_COMMUNITY)
Admission: RE | Admit: 2017-12-23 | Discharge: 2017-12-23 | Disposition: A | Discharge status: Home / Self Care | Payer: Medicare HMO | Source: Ambulatory Visit | Attending: Nurse Practitioner | Admitting: Nurse Practitioner

## 2017-12-23 DIAGNOSIS — K2100 Gastro-esophageal reflux disease with esophagitis, without bleeding: Secondary | ICD-10-CM

## 2017-12-23 DIAGNOSIS — R1310 Dysphagia, unspecified: Secondary | ICD-10-CM

## 2017-12-23 DIAGNOSIS — K449 Diaphragmatic hernia without obstruction or gangrene: Secondary | ICD-10-CM | POA: Diagnosis not present

## 2017-12-23 DIAGNOSIS — R1319 Other dysphagia: Secondary | ICD-10-CM

## 2017-12-23 DIAGNOSIS — K219 Gastro-esophageal reflux disease without esophagitis: Secondary | ICD-10-CM

## 2018-01-03 ENCOUNTER — Ambulatory Visit: Payer: Medicare HMO | Admitting: Physician Assistant

## 2018-01-03 ENCOUNTER — Encounter: Payer: Self-pay | Admitting: Physician Assistant

## 2018-01-03 VITALS — BP 130/77 | HR 75 | Temp 97.4°F | Ht 62.0 in | Wt 144.0 lb

## 2018-01-03 DIAGNOSIS — Z0000 Encounter for general adult medical examination without abnormal findings: Secondary | ICD-10-CM | POA: Diagnosis not present

## 2018-01-03 DIAGNOSIS — R1310 Dysphagia, unspecified: Secondary | ICD-10-CM

## 2018-01-03 DIAGNOSIS — E7800 Pure hypercholesterolemia, unspecified: Secondary | ICD-10-CM | POA: Diagnosis not present

## 2018-01-03 DIAGNOSIS — E039 Hypothyroidism, unspecified: Secondary | ICD-10-CM | POA: Diagnosis not present

## 2018-01-03 DIAGNOSIS — K2100 Gastro-esophageal reflux disease with esophagitis, without bleeding: Secondary | ICD-10-CM

## 2018-01-04 LAB — CBC WITH DIFFERENTIAL/PLATELET
BASOS: 1 %
Basophils Absolute: 0 10*3/uL (ref 0.0–0.2)
EOS (ABSOLUTE): 0.2 10*3/uL (ref 0.0–0.4)
EOS: 3 %
Hematocrit: 40.8 % (ref 34.0–46.6)
Hemoglobin: 14.2 g/dL (ref 11.1–15.9)
IMMATURE GRANULOCYTES: 0 %
Immature Grans (Abs): 0 10*3/uL (ref 0.0–0.1)
LYMPHS ABS: 1.3 10*3/uL (ref 0.7–3.1)
Lymphs: 25 %
MCH: 30.3 pg (ref 26.6–33.0)
MCHC: 34.8 g/dL (ref 31.5–35.7)
MCV: 87 fL (ref 79–97)
MONOS ABS: 0.4 10*3/uL (ref 0.1–0.9)
Monocytes: 8 %
NEUTROS PCT: 63 %
Neutrophils Absolute: 3.3 10*3/uL (ref 1.4–7.0)
PLATELETS: 232 10*3/uL (ref 150–450)
RBC: 4.69 x10E6/uL (ref 3.77–5.28)
RDW: 12.5 % (ref 12.3–15.4)
WBC: 5.2 10*3/uL (ref 3.4–10.8)

## 2018-01-04 LAB — CMP14+EGFR
A/G RATIO: 1.7 (ref 1.2–2.2)
ALT: 18 IU/L (ref 0–32)
AST: 21 IU/L (ref 0–40)
Albumin: 4.4 g/dL (ref 3.5–4.8)
Alkaline Phosphatase: 84 IU/L (ref 39–117)
BUN/Creatinine Ratio: 19 (ref 12–28)
BUN: 12 mg/dL (ref 8–27)
Bilirubin Total: 0.6 mg/dL (ref 0.0–1.2)
CALCIUM: 9.6 mg/dL (ref 8.7–10.3)
CO2: 21 mmol/L (ref 20–29)
Chloride: 101 mmol/L (ref 96–106)
Creatinine, Ser: 0.64 mg/dL (ref 0.57–1.00)
GFR calc Af Amer: 103 mL/min/{1.73_m2} (ref >59)
GFR, EST NON AFRICAN AMERICAN: 89 mL/min/{1.73_m2} (ref >59)
Globulin, Total: 2.6 g/dL (ref 1.5–4.5)
Glucose: 95 mg/dL (ref 65–99)
POTASSIUM: 4.7 mmol/L (ref 3.5–5.2)
Sodium: 139 mmol/L (ref 134–144)
Total Protein: 7 g/dL (ref 6.0–8.5)

## 2018-01-04 LAB — THYROID PANEL WITH TSH
FREE THYROXINE INDEX: 2.9 (ref 1.2–4.9)
T3 Uptake Ratio: 33 % (ref 24–39)
T4, Total: 8.7 ug/dL (ref 4.5–12.0)
TSH: 0.677 u[IU]/mL (ref 0.450–4.500)

## 2018-01-04 LAB — LIPID PANEL
CHOL/HDL RATIO: 3.5 ratio (ref 0.0–4.4)
CHOLESTEROL TOTAL: 152 mg/dL (ref 100–199)
HDL: 44 mg/dL (ref >39)
LDL Calculated: 78 mg/dL (ref 0–99)
TRIGLYCERIDES: 150 mg/dL — AB (ref 0–149)
VLDL Cholesterol Cal: 30 mg/dL (ref 5–40)

## 2018-01-05 NOTE — Progress Notes (Signed)
BP 130/77   Pulse 75   Temp (!) 97.4 F (36.3 C)   Ht 5' 2"  (1.575 m)   Wt 144 lb (65.3 kg)   BMI 26.34 kg/m    Subjective:    Patient ID: Tabitha Fields, female    DOB: Jun 22, 1945, 72 y.o.   MRN: [id number]  HPI: Tabitha Fields is a 72 y.o. female presenting on 01/03/2018 for Follow-up  Patient comes in for recheck on her chronic medical conditions which do include some abnormal swelling, GERD with dysphasia.  She was followed by gastroenterology.  She did have a stricture stretched.  She does have some weakening in her neck as seen on a swallowing test.  She also has a significant hiatal hernia.  She does have hypothyroidism, and hyper lipidemia.  She is currently taking all of her medications.  She does need labs performed soon she is not complaining of any other problems at this time.  Past Medical History:  Diagnosis Date  . GERD (gastroesophageal reflux disease)   . Hypercholesteremia   . Hypertension   . Hypothyroidism   . Thyroid disease    Relevant past medical, surgical, family and social history reviewed and updated as indicated. Interim medical history since our last visit reviewed. Allergies and medications reviewed and updated. DATA REVIEWED: CHART IN EPIC  Family History reviewed for pertinent findings.  Review of Systems  Constitutional: Negative.  Negative for activity change, fatigue and fever.  HENT: Negative.   Eyes: Negative.   Respiratory: Negative.  Negative for cough.   Cardiovascular: Negative.  Negative for chest pain.  Gastrointestinal: Positive for abdominal distention and nausea. Negative for abdominal pain.  Endocrine: Negative.   Genitourinary: Negative.  Negative for dysuria.  Musculoskeletal: Positive for arthralgias.  Skin: Negative.   Neurological: Negative.     Allergies as of 01/03/2018   No Known Allergies     Medication List        Accurate as of 01/03/18 11:59 PM. Always use your most recent med list.            aspirin EC 81 MG tablet Take 81 mg by mouth daily.   calcium carbonate 1250 MG capsule Take 1,250 mg by mouth daily.   Co Q 10 100 MG Caps Take 200 mg by mouth daily.   esomeprazole 40 MG capsule Commonly known as:  NEXIUM Take 1 capsule (40 mg total) by mouth 2 (two) times daily before a meal.   Fish Oil 1200 MG Caps Take 2 capsules by mouth daily.   lactose free nutrition Liqd Take 237 mLs by mouth daily as needed.   levothyroxine 100 MCG tablet Commonly known as:  SYNTHROID, LEVOTHROID TAKE ONE TABLET BY MOUTH EVERY DAY   lisinopril 10 MG tablet Commonly known as:  PRINIVIL,ZESTRIL TAKE 1 TABLET BY MOUTH EVERY DAY   multivitamin capsule Take 1 capsule by mouth daily.   simvastatin 40 MG tablet Commonly known as:  ZOCOR Take 1 tablet (40 mg total) by mouth daily.   VITAMIN C PO Take 1 tablet by mouth daily.          Objective:    BP 130/77   Pulse 75   Temp (!) 97.4 F (36.3 C)   Ht 5' 2"  (1.575 m)   Wt 144 lb (65.3 kg)   BMI 26.34 kg/m   No Known Allergies  Wt Readings from Last 3 Encounters:  01/03/18 144 lb (65.3 kg)  12/18/17 146 lb (66.2  kg)  07/18/17 142 lb 12.8 oz (64.8 kg)    Physical Exam  Constitutional: She is oriented to person, place, and time. She appears well-developed and well-nourished.  HENT:  Head: Normocephalic and atraumatic.  Eyes: Pupils are equal, round, and reactive to light. Conjunctivae and EOM are normal.  Cardiovascular: Normal rate, regular rhythm, normal heart sounds and intact distal pulses.  Pulmonary/Chest: Effort normal and breath sounds normal.  Abdominal: Soft. Bowel sounds are normal.  Neurological: She is alert and oriented to person, place, and time. She has normal reflexes.  Skin: Skin is warm and dry. No rash noted.  Psychiatric: She has a normal mood and affect. Her behavior is normal. Judgment and thought content normal.    Results for orders placed or performed in visit on 01/03/18  CBC with  Differential/Platelet  Result Value Ref Range   WBC 5.2 3.4 - 10.8 x10E3/uL   RBC 4.69 3.77 - 5.28 x10E6/uL   Hemoglobin 14.2 11.1 - 15.9 g/dL   Hematocrit 40.8 34.0 - 46.6 %   MCV 87 79 - 97 fL   MCH 30.3 26.6 - 33.0 pg   MCHC 34.8 31.5 - 35.7 g/dL   RDW 12.5 12.3 - 15.4 %   Platelets 232 150 - 450 x10E3/uL   Neutrophils 63 Not Estab. %   Lymphs 25 Not Estab. %   Monocytes 8 Not Estab. %   Eos 3 Not Estab. %   Basos 1 Not Estab. %   Neutrophils Absolute 3.3 1.4 - 7.0 x10E3/uL   Lymphocytes Absolute 1.3 0.7 - 3.1 x10E3/uL   Monocytes Absolute 0.4 0.1 - 0.9 x10E3/uL   EOS (ABSOLUTE) 0.2 0.0 - 0.4 x10E3/uL   Basophils Absolute 0.0 0.0 - 0.2 x10E3/uL   Immature Granulocytes 0 Not Estab. %   Immature Grans (Abs) 0.0 0.0 - 0.1 x10E3/uL  CMP14+EGFR  Result Value Ref Range   Glucose 95 65 - 99 mg/dL   BUN 12 8 - 27 mg/dL   Creatinine, Ser 0.64 0.57 - 1.00 mg/dL   GFR calc non Af Amer 89 >59 mL/min/1.73   GFR calc Af Amer 103 >59 mL/min/1.73   BUN/Creatinine Ratio 19 12 - 28   Sodium 139 134 - 144 mmol/L   Potassium 4.7 3.5 - 5.2 mmol/L   Chloride 101 96 - 106 mmol/L   CO2 21 20 - 29 mmol/L   Calcium 9.6 8.7 - 10.3 mg/dL   Total Protein 7.0 6.0 - 8.5 g/dL   Albumin 4.4 3.5 - 4.8 g/dL   Globulin, Total 2.6 1.5 - 4.5 g/dL   Albumin/Globulin Ratio 1.7 1.2 - 2.2   Bilirubin Total 0.6 0.0 - 1.2 mg/dL   Alkaline Phosphatase 84 39 - 117 IU/L   AST 21 0 - 40 IU/L   ALT 18 0 - 32 IU/L  Lipid panel  Result Value Ref Range   Cholesterol, Total 152 100 - 199 mg/dL   Triglycerides 150 (H) 0 - 149 mg/dL   HDL 44 >39 mg/dL   VLDL Cholesterol Cal 30 5 - 40 mg/dL   LDL Calculated 78 0 - 99 mg/dL   Chol/HDL Ratio 3.5 0.0 - 4.4 ratio  Thyroid Panel With TSH  Result Value Ref Range   TSH 0.677 0.450 - 4.500 uIU/mL   T4, Total 8.7 4.5 - 12.0 ug/dL   T3 Uptake Ratio 33 24 - 39 %   Free Thyroxine Index 2.9 1.2 - 4.9      Assessment & Plan:   1.  Abnormal swallowing Avoid large  bolus  2. Acquired hypothyroidism - Thyroid Panel With TSH  3. Gastroesophageal reflux disease with esophagitis - CBC with Differential/Platelet  4. Well adult exam - CBC with Differential/Platelet - CMP14+EGFR - Lipid panel - Thyroid Panel With TSH  5. Pure hypercholesterolemia - Lipid panel   Continue all other maintenance medications as listed above.  Follow up plan: No follow-ups on file.  Educational handout given for [place] PA-C [place] [address]  [place], [place] [address] [phone]   01/05/2018, 9:16 PM

## 2018-02-02 ENCOUNTER — Other Ambulatory Visit: Payer: Self-pay | Admitting: Physician Assistant

## 2018-02-13 ENCOUNTER — Encounter

## 2018-02-13 ENCOUNTER — Ambulatory Visit: Payer: Medicare HMO | Admitting: Nurse Practitioner

## 2018-03-19 ENCOUNTER — Encounter: Payer: Self-pay | Admitting: Nurse Practitioner

## 2018-03-19 ENCOUNTER — Ambulatory Visit: Payer: Medicare HMO | Admitting: Nurse Practitioner

## 2018-03-19 VITALS — BP 135/78 | HR 92 | Temp 96.7°F | Ht 62.0 in | Wt 147.6 lb

## 2018-03-19 DIAGNOSIS — K219 Gastro-esophageal reflux disease without esophagitis: Secondary | ICD-10-CM | POA: Diagnosis not present

## 2018-03-19 DIAGNOSIS — R1310 Dysphagia, unspecified: Secondary | ICD-10-CM | POA: Diagnosis not present

## 2018-03-19 DIAGNOSIS — R1319 Other dysphagia: Secondary | ICD-10-CM

## 2018-03-19 NOTE — Progress Notes (Signed)
Referring Provider: Remus Loffler, PA-C Primary Care Physician:  Remus Loffler, PA-C Primary GI:  Dr. Jena Gauss  Chief Complaint  Patient presents with  . Gastroesophageal Reflux    doing ok    HPI:   Tabitha Fields is a 72 y.o. female who presents for follow-up on GERD and dysphasia.  The patient was last seen in our office 12/18/2017 for the same as well as hiatal hernia.  Recent EGD with dilated, atonic otherwise normal esophagus which was empirically dilated along with large hiatal hernia.  Swallowing dramatically improved after this.  At her last visit she noted still having some dysphasia but better than prior to EGD.  More problems when she is eating out at a restaurant versus at home, "hanging up" of food intermittently.  She reduced her Nexium to once daily and has dysphasia if she is not careful when she eats.  Tries eat softer foods and avoid meats and dry breads.  Occasional regurgitation.  Takes mineral oil for constipation which works well for her.  No other GI symptoms.  Recommended Nexium twice daily which was refilled.  Recommended barium pill esophagram, continue current medications, follow-up in 3 months.  Barium pill esophagram completed 12/23/2017 which found large hiatal hernia, age-related esophageal dysmotility, mild vallecular and piriform sinus residuals without laryngeal penetration or aspiration.  Recommended speech language pathology evaluation.  They indicated they would get back to [place] about this.  No further follow-up occurred.  Today she states she's doing ok overall. GERD symptoms well managed and "vastly improved." She is still on Nexium bid. Dysphagia not a problem currently, is avoiding tough meats and rice. Spoke with a SLP and made posture recommendations which she feels helps a lot. Denies abdominal pain, N/V, hematochezia, melena. Denies chest pain, dyspnea, dizziness, lightheadedness, syncope, near syncope. Denies any other upper or lower GI  symptoms.  Past Medical History:  Diagnosis Date  . GERD (gastroesophageal reflux disease)   . Hypercholesteremia   . Hypertension   . Hypothyroidism   . Thyroid disease     Past Surgical History:  Procedure Laterality Date  . COLONOSCOPY  07/28/04   Dr. Betha Loa  . COLONOSCOPY  2011   Dr. Jena Gauss: anal canal hemorhoids, pancolonic diverticula, otherwise normal  . COLONOSCOPY N/A 03/17/2015   DR. Rourk: diverticulosis. consider one last tcs in 5 years for prior h/o tubular adenoma.  . ESOPHAGOGASTRODUODENOSCOPY (EGD) WITH PROPOFOL N/A 06/06/2017   Dr. Jena Gauss: Slightly baggy, atonic tubular esophagus which otherwise appeared normal, widely patent.  Dilated for history of dysphagia.  Large hiatal hernia.  Marland Kitchen HEMORROIDECTOMY    . MALONEY DILATION N/A 06/06/2017   Procedure: Elease Hashimoto DILATION;  Surgeon: Corbin Ade, MD;  Location: [facility];  Service: Endoscopy;  Laterality: N/A;  . TUBAL LIGATION      Current Outpatient Medications  Medication Sig Dispense Refill  . Ascorbic Acid (VITAMIN C PO) Take 1 tablet by mouth daily.    Marland Kitchen aspirin EC 81 MG tablet Take 81 mg by mouth daily.    . calcium carbonate 1250 MG capsule Take 1,250 mg by mouth daily.     . Coenzyme Q10 (CO Q 10) 100 MG CAPS Take 200 mg by mouth daily.    Marland Kitchen esomeprazole (NEXIUM) 40 MG capsule Take 1 capsule (40 mg total) by mouth 2 (two) times daily before a meal. 60 capsule 11  . lactose free nutrition (BOOST) LIQD Take 237 mLs by mouth daily as needed.    Marland Kitchen  levothyroxine (SYNTHROID, LEVOTHROID) 100 MCG tablet TAKE 1 TABLET BY MOUTH EVERY DAY 90 tablet 3  . lisinopril (PRINIVIL,ZESTRIL) 10 MG tablet TAKE 1 TABLET BY MOUTH EVERY DAY 30 tablet 5  . Multiple Vitamin (MULTIVITAMIN) capsule Take 1 capsule by mouth daily.    . Omega-3 Fatty Acids (FISH OIL) 1200 MG CAPS Take 2 capsules by mouth daily.     . simvastatin (ZOCOR) 40 MG tablet Take 1 tablet (40 mg total) by mouth daily. 30 tablet 0   No current  facility-administered medications for this visit.     Allergies as of 03/19/2018  . (No Known Allergies)    Family History  Problem Relation Age of Onset  . Esophageal cancer Brother 64  . Colon cancer Neg Hx     Social History   Socioeconomic History  . Marital status: Widowed    Spouse name: Not on file  . Number of children: Not on file  . Years of education: Not on file  . Highest education level: Not on file  Occupational History  . Not on file  Social Needs  . Financial resource strain: Not on file  . Food insecurity:    Worry: Not on file    Inability: Not on file  . Transportation needs:    Medical: Not on file    Non-medical: Not on file  Tobacco Use  . Smoking status: Never Smoker  . Smokeless tobacco: Never Used  Substance and Sexual Activity  . Alcohol use: No    Alcohol/week: 0.0 standard drinks  . Drug use: No  . Sexual activity: Not Currently    Birth control/protection: Post-menopausal  Lifestyle  . Physical activity:    Days per week: Not on file    Minutes per session: Not on file  . Stress: Not on file  Relationships  . Social connections:    Talks on phone: Not on file    Gets together: Not on file    Attends religious service: Not on file    Active member of club or organization: Not on file    Attends meetings of clubs or organizations: Not on file    Relationship status: Not on file  Other Topics Concern  . Not on file  Social History Narrative  . Not on file    Review of Systems: General: Negative for anorexia, weight loss, fever, chills, fatigue, weakness. ENT: Negative for hoarseness, difficulty swallowing. CV: Negative for chest pain, angina, palpitations, peripheral edema.  Respiratory: Negative for dyspnea at rest, cough, sputum, wheezing.  GI: See history of present illness. Endo: Negative for unusual weight change.  Heme: Negative for bruising or bleeding.   Physical Exam: BP 135/78   Pulse 92   Temp (!) 96.7 F  (35.9 C) (Oral)   Ht 5\' 2"  (1.575 m)   Wt 147 lb 9.6 oz (67 kg)   BMI 27.00 kg/m  General:   Alert and oriented. Pleasant and cooperative. Well-nourished and well-developed.  Eyes:  Without icterus, sclera clear and conjunctiva pink.  Ears:  Normal auditory acuity. Cardiovascular:  S1, S2 present without murmurs appreciated. Extremities without clubbing or edema. Respiratory:  Clear to auscultation bilaterally. No wheezes, rales, or rhonchi. No distress.  Gastrointestinal:  +BS, soft, non-tender and non-distended. No HSM noted. No guarding or rebound. No masses appreciated.  Rectal:  Deferred  Musculoskalatal:  Symmetrical without gross deformities. Normal posture. Neurologic:  Alert and oriented x4;  grossly normal neurologically. Psych:  Alert and cooperative. Normal mood and  affect. Heme/Lymph/Immune: No excessive bruising noted.    03/19/2018 3:13 PM   Disclaimer: This note was dictated with voice recognition software. Similar sounding words can inadvertently be transcribed and may not be corrected upon review.

## 2018-03-19 NOTE — Progress Notes (Signed)
cc'ed to pcp °

## 2018-03-19 NOTE — Assessment & Plan Note (Signed)
GERD currently very well managed on twice daily PPI and dietary changes.  Recommend she continue her current medications and follow-up in 1 year.  Call if any problems before then.

## 2018-03-19 NOTE — Assessment & Plan Note (Signed)
Dysphasia currently doing quite well, essentially resolved with recommendations after meeting with speech-language pathology.  Barium pill esophagram found essentially esophageal dysmotility.  SLP recommendations were mostly related to posture and chewing/swallowing mechanics.  Recommend she continue these recommendations.  Follow-up in 1 year.  Call us of any problems before then.

## 2018-03-19 NOTE — Patient Instructions (Signed)
1. Continue your current medications. 2. Continue with the recommendations of speech pathologist made. 3. Return for follow-up in 1 year. 4. Call us if you have any questions or concerns.  At [facility] we value your feedback. You may receive a survey about your visit today. Please share your experience as we strive to create trusting relationships with our patients to provide genuine, compassionate, quality care.  We appreciate your understanding and patience as we review any laboratory studies, imaging, and other diagnostic tests that are ordered as we care for you. Our office policy is 5 business days for review of these results, and any emergent or urgent results are addressed in a timely manner for your best interest. If you do not hear from our office in 1 week, please contact us.   We also encourage the use of MyChart, which contains your medical information for your review as well. If you are not enrolled in this feature, an access code is on this after visit summary for your convenience. Thank you for allowing us to be involved in your care.  It was great to see you today!  I hope you have a Merry Christmas!!

## 2018-04-23 ENCOUNTER — Other Ambulatory Visit (HOSPITAL_COMMUNITY): Payer: Self-pay | Admitting: Physician Assistant

## 2018-04-23 DIAGNOSIS — Z1231 Encounter for screening mammogram for malignant neoplasm of breast: Secondary | ICD-10-CM

## 2018-05-08 ENCOUNTER — Other Ambulatory Visit: Payer: Self-pay | Admitting: *Deleted

## 2018-05-08 MED ORDER — LISINOPRIL 10 MG PO TABS: 10.0000 mg | ORAL_TABLET | Freq: Every day | ORAL | 2 refills | Status: DC

## 2018-05-19 ENCOUNTER — Ambulatory Visit (HOSPITAL_COMMUNITY)
Admission: RE | Admit: 2018-05-19 | Discharge: 2018-05-19 | Disposition: A | Discharge status: Home / Self Care | Payer: Medicare HMO | Source: Ambulatory Visit | Attending: Physician Assistant | Admitting: Physician Assistant

## 2018-05-19 DIAGNOSIS — Z1231 Encounter for screening mammogram for malignant neoplasm of breast: Secondary | ICD-10-CM | POA: Insufficient documentation

## 2018-07-29 ENCOUNTER — Other Ambulatory Visit: Payer: Self-pay | Admitting: Physician Assistant

## 2018-07-29 NOTE — Telephone Encounter (Signed)
Per 01/03/18 labs rck 1 yr

## 2018-08-26 ENCOUNTER — Other Ambulatory Visit: Payer: Self-pay | Admitting: Physician Assistant

## 2018-09-23 ENCOUNTER — Other Ambulatory Visit: Payer: Self-pay

## 2018-09-23 ENCOUNTER — Encounter: Payer: Self-pay | Admitting: Physician Assistant

## 2018-09-23 ENCOUNTER — Ambulatory Visit (INDEPENDENT_AMBULATORY_CARE_PROVIDER_SITE_OTHER): Payer: Medicare HMO | Admitting: Physician Assistant

## 2018-09-23 DIAGNOSIS — R1310 Dysphagia, unspecified: Secondary | ICD-10-CM

## 2018-09-23 DIAGNOSIS — K2100 Gastro-esophageal reflux disease with esophagitis, without bleeding: Secondary | ICD-10-CM

## 2018-09-23 DIAGNOSIS — E039 Hypothyroidism, unspecified: Secondary | ICD-10-CM

## 2018-09-23 DIAGNOSIS — K449 Diaphragmatic hernia without obstruction or gangrene: Secondary | ICD-10-CM

## 2018-09-23 DIAGNOSIS — I10 Essential (primary) hypertension: Secondary | ICD-10-CM | POA: Diagnosis not present

## 2018-09-23 DIAGNOSIS — E7800 Pure hypercholesterolemia, unspecified: Secondary | ICD-10-CM

## 2018-09-23 DIAGNOSIS — R1319 Other dysphagia: Secondary | ICD-10-CM

## 2018-09-23 MED ORDER — SIMVASTATIN 40 MG PO TABS: 40.0000 mg | ORAL_TABLET | Freq: Every day | ORAL | 11 refills | Status: DC

## 2018-09-23 MED ORDER — LEVOTHYROXINE SODIUM 100 MCG PO TABS: 100.0000 ug | ORAL_TABLET | Freq: Every day | ORAL | 3 refills | Status: DC

## 2018-09-23 MED ORDER — ESOMEPRAZOLE MAGNESIUM 40 MG PO CPDR: 40.0000 mg | DELAYED_RELEASE_CAPSULE | Freq: Two times a day (BID) | ORAL | 11 refills | Status: DC

## 2018-09-23 MED ORDER — LISINOPRIL 10 MG PO TABS: 10.0000 mg | ORAL_TABLET | Freq: Every day | ORAL | 11 refills | Status: DC

## 2018-09-23 NOTE — Progress Notes (Signed)
Telephone visit  Subjective: CC: Recheck on chronic medical conditions PCP: Terald Sleeper, PA-C RUE:[id number] Tabitha Fields is a 73 y.o. female calls for telephone consult today. Patient provides verbal consent for consult held via phone.  Patient is identified with 2 separate identifiers.  At this time the entire area is on COVID-19 social distancing and stay home orders are in place.  Patient is of higher risk and therefore we are performing this by a virtual method.  Location of patient: Home Location of provider: HOME Others present for call: No  This patient is having a chronic recheck on all of her medical conditions.  She does have GERD, hypothyroidism, hiatal hernia, hyperlipidemia.  She also has hypertension.  She reports that overall she has been doing well her blood pressure readings have been very good.  She usually checks her self twice a week.  She states she has not really had any significant changes in her life.  And her medications are still doing well.  I am going to have her do lab work at her earliest convenience, the lab orders will be placed.  I will also send refills and on all of her medications so they will be up-to-date for her.   ROS: Per HPI  No Known Allergies Past Medical History:  Diagnosis Date  . GERD (gastroesophageal reflux disease)   . Hypercholesteremia   . Hypertension   . Hypothyroidism   . Thyroid disease     Current Outpatient Medications:  .  Ascorbic Acid (VITAMIN C PO), Take 1 tablet by mouth daily., Disp: , Rfl:  .  aspirin EC 81 MG tablet, Take 81 mg by mouth daily., Disp: , Rfl:  .  calcium carbonate 1250 MG capsule, Take 1,250 mg by mouth daily. , Disp: , Rfl:  .  Coenzyme Q10 (CO Q 10) 100 MG CAPS, Take 200 mg by mouth daily., Disp: , Rfl:  .  esomeprazole (NEXIUM) 40 MG capsule, Take 1 capsule (40 mg total) by mouth 2 (two) times daily before a meal., Disp: 60 capsule, Rfl: 11 .  lactose free nutrition (BOOST) LIQD, Take 237 mLs  by mouth daily as needed., Disp: , Rfl:  .  levothyroxine (SYNTHROID) 100 MCG tablet, Take 1 tablet (100 mcg total) by mouth daily., Disp: 90 tablet, Rfl: 3 .  lisinopril (ZESTRIL) 10 MG tablet, Take 1 tablet (10 mg total) by mouth daily. Needs to be seen for future refills, Disp: 30 tablet, Rfl: 11 .  Multiple Vitamin (MULTIVITAMIN) capsule, Take 1 capsule by mouth daily., Disp: , Rfl:  .  Omega-3 Fatty Acids (FISH OIL) 1200 MG CAPS, Take 2 capsules by mouth daily. , Disp: , Rfl:  .  simvastatin (ZOCOR) 40 MG tablet, Take 1 tablet (40 mg total) by mouth daily., Disp: 30 tablet, Rfl: 11  Assessment/ Plan: 73 y.o. female   1. Esophageal dysphagia - esomeprazole (NEXIUM) 40 MG capsule; Take 1 capsule (40 mg total) by mouth 2 (two) times daily before a meal.  Dispense: 60 capsule; Refill: 11  2. Gastroesophageal reflux disease with esophagitis - esomeprazole (NEXIUM) 40 MG capsule; Take 1 capsule (40 mg total) by mouth 2 (two) times daily before a meal.  Dispense: 60 capsule; Refill: 11  3. Hiatal hernia - esomeprazole (NEXIUM) 40 MG capsule; Take 1 capsule (40 mg total) by mouth 2 (two) times daily before a meal.  Dispense: 60 capsule; Refill: 11  4. Acquired hypothyroidism - Thyroid Panel With TSH; Future  5. Pure  hypercholesterolemia - CBC with Differential/Platelet; Future - CMP14+EGFR; Future - Lipid panel; Future  6. Essential hypertension - lisinopril (ZESTRIL) 10 MG tablet; Take 1 tablet (10 mg total) by mouth daily. Needs to be seen for future refills  Dispense: 30 tablet; Refill: 11   Continue all other maintenance medications as listed above.  Start time: 8:53 AM End time: 9:04 AM  Meds ordered this encounter  Medications  . esomeprazole (NEXIUM) 40 MG capsule    Sig: Take 1 capsule (40 mg total) by mouth 2 (two) times daily before a meal.    Dispense:  60 capsule    Refill:  11    Order Specific Question:   Supervising Provider    Answer:   Janora Norlander  [[id number]]  . lisinopril (ZESTRIL) 10 MG tablet    Sig: Take 1 tablet (10 mg total) by mouth daily. Needs to be seen for future refills    Dispense:  30 tablet    Refill:  11    This prescription was filled on 08/06/2018. Any refills authorized will be placed on file.    Order Specific Question:   Supervising Provider    Answer:   Janora Norlander [[id number]]  . simvastatin (ZOCOR) 40 MG tablet    Sig: Take 1 tablet (40 mg total) by mouth daily.    Dispense:  30 tablet    Refill:  11    Order Specific Question:   Supervising Provider    Answer:   Janora Norlander [[id number]]  . levothyroxine (SYNTHROID) 100 MCG tablet    Sig: Take 1 tablet (100 mcg total) by mouth daily.    Dispense:  90 tablet    Refill:  3    Order Specific Question:   Supervising Provider    Answer:   Janora Norlander [[id number]]    Particia Nearing PA-C [place] [phone]

## 2018-10-01 ENCOUNTER — Other Ambulatory Visit: Payer: Medicare HMO

## 2018-10-01 ENCOUNTER — Other Ambulatory Visit: Payer: Self-pay

## 2018-10-01 DIAGNOSIS — E7800 Pure hypercholesterolemia, unspecified: Secondary | ICD-10-CM | POA: Diagnosis not present

## 2018-10-01 DIAGNOSIS — E039 Hypothyroidism, unspecified: Secondary | ICD-10-CM | POA: Diagnosis not present

## 2018-10-02 LAB — CMP14+EGFR
ALT: 19 IU/L (ref 0–32)
AST: 22 IU/L (ref 0–40)
Albumin/Globulin Ratio: 1.9 (ref 1.2–2.2)
Albumin: 4.7 g/dL (ref 3.7–4.7)
Alkaline Phosphatase: 89 IU/L (ref 39–117)
BUN/Creatinine Ratio: 17 (ref 12–28)
BUN: 11 mg/dL (ref 8–27)
Bilirubin Total: 0.6 mg/dL (ref 0.0–1.2)
CO2: 20 mmol/L (ref 20–29)
Calcium: 9.5 mg/dL (ref 8.7–10.3)
Chloride: 99 mmol/L (ref 96–106)
Creatinine, Ser: 0.65 mg/dL (ref 0.57–1.00)
GFR calc Af Amer: 102 mL/min/{1.73_m2} (ref >59)
GFR calc non Af Amer: 88 mL/min/{1.73_m2} (ref >59)
Globulin, Total: 2.5 g/dL (ref 1.5–4.5)
Glucose: 93 mg/dL (ref 65–99)
Potassium: 4.5 mmol/L (ref 3.5–5.2)
Sodium: 137 mmol/L (ref 134–144)
Total Protein: 7.2 g/dL (ref 6.0–8.5)

## 2018-10-02 LAB — CBC WITH DIFFERENTIAL/PLATELET
Basophils Absolute: 0 10*3/uL (ref 0.0–0.2)
Basos: 1 %
EOS (ABSOLUTE): 0.2 10*3/uL (ref 0.0–0.4)
Eos: 3 %
Hematocrit: 41.2 % (ref 34.0–46.6)
Hemoglobin: 14.1 g/dL (ref 11.1–15.9)
Immature Grans (Abs): 0 10*3/uL (ref 0.0–0.1)
Immature Granulocytes: 0 %
Lymphocytes Absolute: 1.3 10*3/uL (ref 0.7–3.1)
Lymphs: 26 %
MCH: 30 pg (ref 26.6–33.0)
MCHC: 34.2 g/dL (ref 31.5–35.7)
MCV: 88 fL (ref 79–97)
Monocytes Absolute: 0.4 10*3/uL (ref 0.1–0.9)
Monocytes: 9 %
Neutrophils Absolute: 3 10*3/uL (ref 1.4–7.0)
Neutrophils: 61 %
Platelets: 208 10*3/uL (ref 150–450)
RBC: 4.7 x10E6/uL (ref 3.77–5.28)
RDW: 12.7 % (ref 11.7–15.4)
WBC: 4.8 10*3/uL (ref 3.4–10.8)

## 2018-10-02 LAB — LIPID PANEL
Chol/HDL Ratio: 3.3 ratio (ref 0.0–4.4)
Cholesterol, Total: 150 mg/dL (ref 100–199)
HDL: 46 mg/dL (ref >39)
LDL Calculated: 74 mg/dL (ref 0–99)
Triglycerides: 150 mg/dL — ABNORMAL HIGH (ref 0–149)
VLDL Cholesterol Cal: 30 mg/dL (ref 5–40)

## 2018-10-02 LAB — THYROID PANEL WITH TSH
Free Thyroxine Index: 2.6 (ref 1.2–4.9)
T3 Uptake Ratio: 31 % (ref 24–39)
T4, Total: 8.4 ug/dL (ref 4.5–12.0)
TSH: 1.28 u[IU]/mL (ref 0.450–4.500)

## 2018-10-22 ENCOUNTER — Ambulatory Visit: Payer: Medicare HMO

## 2018-12-25 DIAGNOSIS — R69 Illness, unspecified: Secondary | ICD-10-CM | POA: Diagnosis not present

## 2019-02-24 ENCOUNTER — Encounter: Payer: Self-pay | Admitting: Internal Medicine

## 2019-03-02 ENCOUNTER — Other Ambulatory Visit: Payer: Self-pay

## 2019-03-03 ENCOUNTER — Encounter: Payer: Self-pay | Admitting: Physician Assistant

## 2019-03-03 ENCOUNTER — Ambulatory Visit: Payer: Medicare HMO | Admitting: Physician Assistant

## 2019-03-03 VITALS — BP 148/75 | HR 70 | Temp 97.0°F | Ht 62.0 in | Wt 148.0 lb

## 2019-03-03 DIAGNOSIS — K449 Diaphragmatic hernia without obstruction or gangrene: Secondary | ICD-10-CM

## 2019-03-03 DIAGNOSIS — R1310 Dysphagia, unspecified: Secondary | ICD-10-CM | POA: Diagnosis not present

## 2019-03-03 DIAGNOSIS — R1319 Other dysphagia: Secondary | ICD-10-CM

## 2019-03-03 DIAGNOSIS — I10 Essential (primary) hypertension: Secondary | ICD-10-CM

## 2019-03-03 DIAGNOSIS — K2100 Gastro-esophageal reflux disease with esophagitis, without bleeding: Secondary | ICD-10-CM

## 2019-03-03 MED ORDER — ESOMEPRAZOLE MAGNESIUM 40 MG PO CPDR: 40.0000 mg | DELAYED_RELEASE_CAPSULE | Freq: Two times a day (BID) | ORAL | 3 refills | Status: DC

## 2019-03-03 MED ORDER — SIMVASTATIN 40 MG PO TABS: 40.0000 mg | ORAL_TABLET | Freq: Every day | ORAL | 3 refills | Status: DC

## 2019-03-03 MED ORDER — LEVOTHYROXINE SODIUM 100 MCG PO TABS: 100.0000 ug | ORAL_TABLET | Freq: Every day | ORAL | 3 refills | Status: DC

## 2019-03-03 MED ORDER — LISINOPRIL 10 MG PO TABS: 10.0000 mg | ORAL_TABLET | Freq: Every day | ORAL | 3 refills | Status: DC

## 2019-03-03 NOTE — Progress Notes (Signed)
BP (!) 148/75   Pulse 70   Temp (!) 97 F (36.1 C) (Temporal)   Ht 5' 2"  (1.575 m)   Wt 148 lb (67.1 kg)   SpO2 97%   BMI 27.07 kg/m    Subjective:    Patient ID: Tabitha Fields, female    DOB: [birth year], 73 y.o.   MRN: [id number]  HPI: Tabitha Fields is a 73 y.o. female presenting on 03/03/2019 for Medical Management of Chronic Issues  Patient is having a follow-up on her chronic medical conditions which do include GERD, hypothyroidism, hypertension, hyperlipidemia.  All of her labs are reviewed.  She states that overall she is doing fairly well.  The Nexium has helped her GERD.  But she does have a known severe hiatal hernia that cannot be operated on.  She is very careful about where and when she eats.  She does not try to eat a large amount at any time.  She states that overall her symptoms with that are a lot better.  Past Medical History:  Diagnosis Date  . GERD (gastroesophageal reflux disease)   . Hypercholesteremia   . Hypertension   . Hypothyroidism   . Thyroid disease    Relevant past medical, surgical, family and social history reviewed and updated as indicated. Interim medical history since our last visit reviewed. Allergies and medications reviewed and updated. DATA REVIEWED: CHART IN EPIC  Family History reviewed for pertinent findings.  Review of Systems  Constitutional: Negative.   HENT: Negative.   Eyes: Negative.   Respiratory: Negative.   Gastrointestinal: Negative.   Genitourinary: Negative.     Allergies as of 03/03/2019   No Known Allergies     Medication List       Accurate as of March 03, 2019  8:51 AM. If you have any questions, ask your nurse or doctor.        aspirin EC 81 MG tablet Take 81 mg by mouth daily.   calcium carbonate 1250 MG capsule Take 1,250 mg by mouth daily.   Co Q 10 100 MG Caps Take 200 mg by mouth daily.   esomeprazole 40 MG capsule Commonly known as: NEXIUM Take 1 capsule (40 mg total) by  mouth 2 (two) times daily before a meal.   Fish Oil 1200 MG Caps Take 2 capsules by mouth daily.   lactose free nutrition Liqd Take 237 mLs by mouth daily as needed.   levothyroxine 100 MCG tablet Commonly known as: SYNTHROID Take 1 tablet (100 mcg total) by mouth daily.   lisinopril 10 MG tablet Commonly known as: ZESTRIL Take 1 tablet (10 mg total) by mouth daily. What changed: additional instructions Changed by: Terald Sleeper, PA-C   multivitamin capsule Take 1 capsule by mouth daily.   simvastatin 40 MG tablet Commonly known as: ZOCOR Take 1 tablet (40 mg total) by mouth daily.   VITAMIN C PO Take 1 tablet by mouth daily.          Objective:    BP (!) 148/75   Pulse 70   Temp (!) 97 F (36.1 C) (Temporal)   Ht 5' 2"  (1.575 m)   Wt 148 lb (67.1 kg)   SpO2 97%   BMI 27.07 kg/m   No Known Allergies  Wt Readings from Last 3 Encounters:  03/03/19 148 lb (67.1 kg)  03/19/18 147 lb 9.6 oz (67 kg)  01/03/18 144 lb (65.3 kg)    Physical Exam Constitutional:  General: She is not in acute distress.    Appearance: Normal appearance. She is well-developed.  HENT:     Head: Normocephalic and atraumatic.  Cardiovascular:     Rate and Rhythm: Normal rate.  Pulmonary:     Effort: Pulmonary effort is normal.  Skin:    General: Skin is warm and dry.     Findings: No rash.  Neurological:     Mental Status: She is alert and oriented to person, place, and time.     Deep Tendon Reflexes: Reflexes are normal and symmetric.     Results for orders placed or performed in visit on 10/01/18  Lipid panel  Result Value Ref Range   Cholesterol, Total 150 100 - 199 mg/dL   Triglycerides 150 (H) 0 - 149 mg/dL   HDL 46 >39 mg/dL   VLDL Cholesterol Cal 30 5 - 40 mg/dL   LDL Calculated 74 0 - 99 mg/dL   Chol/HDL Ratio 3.3 0.0 - 4.4 ratio  Thyroid Panel With TSH  Result Value Ref Range   TSH 1.280 0.450 - 4.500 uIU/mL   T4, Total 8.4 4.5 - 12.0 ug/dL   T3 Uptake  Ratio 31 24 - 39 %   Free Thyroxine Index 2.6 1.2 - 4.9  CMP14+EGFR  Result Value Ref Range   Glucose 93 65 - 99 mg/dL   BUN 11 8 - 27 mg/dL   Creatinine, Ser 0.65 0.57 - 1.00 mg/dL   GFR calc non Af Amer 88 >59 mL/min/1.73   GFR calc Af Amer 102 >59 mL/min/1.73   BUN/Creatinine Ratio 17 12 - 28   Sodium 137 134 - 144 mmol/L   Potassium 4.5 3.5 - 5.2 mmol/L   Chloride 99 96 - 106 mmol/L   CO2 20 20 - 29 mmol/L   Calcium 9.5 8.7 - 10.3 mg/dL   Total Protein 7.2 6.0 - 8.5 g/dL   Albumin 4.7 3.7 - 4.7 g/dL   Globulin, Total 2.5 1.5 - 4.5 g/dL   Albumin/Globulin Ratio 1.9 1.2 - 2.2   Bilirubin Total 0.6 0.0 - 1.2 mg/dL   Alkaline Phosphatase 89 39 - 117 IU/L   AST 22 0 - 40 IU/L   ALT 19 0 - 32 IU/L  CBC with Differential/Platelet  Result Value Ref Range   WBC 4.8 3.4 - 10.8 x10E3/uL   RBC 4.70 3.77 - 5.28 x10E6/uL   Hemoglobin 14.1 11.1 - 15.9 g/dL   Hematocrit 41.2 34.0 - 46.6 %   MCV 88 79 - 97 fL   MCH 30.0 26.6 - 33.0 pg   MCHC 34.2 31.5 - 35.7 g/dL   RDW 12.7 11.7 - 15.4 %   Platelets 208 150 - 450 x10E3/uL   Neutrophils 61 Not Estab. %   Lymphs 26 Not Estab. %   Monocytes 9 Not Estab. %   Eos 3 Not Estab. %   Basos 1 Not Estab. %   Neutrophils Absolute 3.0 1.4 - 7.0 x10E3/uL   Lymphocytes Absolute 1.3 0.7 - 3.1 x10E3/uL   Monocytes Absolute 0.4 0.1 - 0.9 x10E3/uL   EOS (ABSOLUTE) 0.2 0.0 - 0.4 x10E3/uL   Basophils Absolute 0.0 0.0 - 0.2 x10E3/uL   Immature Granulocytes 0 Not Estab. %   Immature Grans (Abs) 0.0 0.0 - 0.1 x10E3/uL      Assessment & Plan:   1. Essential hypertension - lisinopril (ZESTRIL) 10 MG tablet; Take 1 tablet (10 mg total) by mouth daily.  Dispense: 90 tablet; Refill: 3  2. Esophageal  dysphagia - esomeprazole (NEXIUM) 40 MG capsule; Take 1 capsule (40 mg total) by mouth 2 (two) times daily before a meal.  Dispense: 180 capsule; Refill: 3  3. Gastroesophageal reflux disease with esophagitis - esomeprazole (NEXIUM) 40 MG capsule; Take 1  capsule (40 mg total) by mouth 2 (two) times daily before a meal.  Dispense: 180 capsule; Refill: 3  4. Hiatal hernia - esomeprazole (NEXIUM) 40 MG capsule; Take 1 capsule (40 mg total) by mouth 2 (two) times daily before a meal.  Dispense: 180 capsule; Refill: 3   Continue all other maintenance medications as listed above.  Follow up plan: No follow-ups on file.  Educational handout given for [place] PA-C Clyde [address]  [place], [place] [address] [phone]   03/03/2019, 8:51 AM

## 2019-03-06 ENCOUNTER — Other Ambulatory Visit: Payer: Self-pay

## 2019-03-06 ENCOUNTER — Encounter: Payer: Self-pay | Admitting: Internal Medicine

## 2019-03-06 ENCOUNTER — Ambulatory Visit: Payer: Medicare HMO | Admitting: Internal Medicine

## 2019-03-06 VITALS — BP 150/79 | HR 85 | Temp 98.2°F | Ht 62.0 in | Wt 147.8 lb

## 2019-03-06 DIAGNOSIS — R1319 Other dysphagia: Secondary | ICD-10-CM

## 2019-03-06 DIAGNOSIS — K5909 Other constipation: Secondary | ICD-10-CM | POA: Diagnosis not present

## 2019-03-06 DIAGNOSIS — K2100 Gastro-esophageal reflux disease with esophagitis, without bleeding: Secondary | ICD-10-CM | POA: Diagnosis not present

## 2019-03-06 DIAGNOSIS — R1310 Dysphagia, unspecified: Secondary | ICD-10-CM | POA: Diagnosis not present

## 2019-03-06 NOTE — Patient Instructions (Signed)
Continue Nexium 40 mg twice daily (as discussed the benefits outweigh the risks)  GERD and constipation information information provided  Take Benefiber 2 tablespoons every day  Miralax - 1 capful nightly as needed for constipation  OV in 1 year and as needed  As discussed, a future colonoscopy is not recommended unless new symptoms arise

## 2019-03-06 NOTE — Progress Notes (Signed)
Primary Care Physician:  Terald Sleeper, PA-C Primary Gastroenterologist:  Dr. Gala Romney  Pre-Procedure History & Physical: HPI:  Tabitha Fields is a 73 y.o. female here for follow-up of GERD/dysphagia constipation.  History colonic adenoma-distant past.  Reflux doing well on Nexium 40 mg twice daily.  If she misses a single dose daily she has a flare.  Dysphagia has resoved; dysmotility on barium study  - empiric dilation previously has been long-lasting. Intermittent constipation.  Takes Benefiber every other day and mineral oil sporadically.  She is concerned about it turning her's to "oily".  Simple adenoma removed in the distant past;   last 2 colonoscopies negative.  No other bowel symptoms.  Denies bleeding.  Past Medical History:  Diagnosis Date  . GERD (gastroesophageal reflux disease)   . Hypercholesteremia   . Hypertension   . Hypothyroidism   . Thyroid disease     Past Surgical History:  Procedure Laterality Date  . COLONOSCOPY  07/28/04   Dr. Milana Huntsman  . COLONOSCOPY  2011   Dr. Gala Romney: anal canal hemorhoids, pancolonic diverticula, otherwise normal  . COLONOSCOPY N/A 03/17/2015   DR. Rourk: diverticulosis. consider one last tcs in 5 years for prior h/o tubular adenoma.  . ESOPHAGOGASTRODUODENOSCOPY (EGD) WITH PROPOFOL N/A 06/06/2017   Dr. Gala Romney: Slightly baggy, atonic tubular esophagus which otherwise appeared normal, widely patent.  Dilated for history of dysphagia.  Large hiatal hernia.  Marland Kitchen HEMORROIDECTOMY    . MALONEY DILATION N/A 06/06/2017   Procedure: Venia Minks DILATION;  Surgeon: Daneil Dolin, MD;  Location: [facility];  Service: Endoscopy;  Laterality: N/A;  . TUBAL LIGATION      Prior to Admission medications   Medication Sig Start Date End Date Taking? Authorizing Provider  Ascorbic Acid (VITAMIN C PO) Take 1 tablet by mouth daily.   Yes [provider]  aspirin EC 81 MG tablet Take 81 mg by mouth daily.   Yes [provider]   calcium carbonate 1250 MG capsule Take 1,250 mg by mouth daily.    Yes [provider]  Coenzyme Q10 (CO Q 10) 100 MG CAPS Take 200 mg by mouth daily.   Yes [provider]  esomeprazole (NEXIUM) 40 MG capsule Take 1 capsule (40 mg total) by mouth 2 (two) times daily before a meal. 03/03/19  Yes Terald Sleeper, PA-C  lactose free nutrition (BOOST) LIQD Take 237 mLs by mouth daily as needed.   Yes [provider]  levothyroxine (SYNTHROID) 100 MCG tablet Take 1 tablet (100 mcg total) by mouth daily. 03/03/19  Yes Terald Sleeper, PA-C  lisinopril (ZESTRIL) 10 MG tablet Take 1 tablet (10 mg total) by mouth daily. 03/03/19  Yes Terald Sleeper, PA-C  Multiple Vitamin (MULTIVITAMIN) capsule Take 1 capsule by mouth daily.   Yes [provider]  Omega-3 Fatty Acids (FISH OIL) 1200 MG CAPS Take 2 capsules by mouth daily.    Yes [provider]  simvastatin (ZOCOR) 40 MG tablet Take 1 tablet (40 mg total) by mouth daily. 03/03/19  Yes Terald Sleeper, PA-C  Wheat Dextrin (BENEFIBER PO) Take by mouth daily.   Yes [provider]    Allergies as of 03/06/2019  . (No Known Allergies)    Family History  Problem Relation Age of Onset  . Esophageal cancer Brother 20  . Colon cancer Neg Hx     Social History   Socioeconomic History  . Marital status: Widowed    Spouse  name: Not on file  . Number of children: Not on file  . Years of education: Not on file  . Highest education level: Not on file  Occupational History  . Not on file  Social Needs  . Financial resource strain: Not on file  . Food insecurity    Worry: Not on file    Inability: Not on file  . Transportation needs    Medical: Not on file    Non-medical: Not on file  Tobacco Use  . Smoking status: Never Smoker  . Smokeless tobacco: Never Used  Substance and Sexual Activity  . Alcohol use: No    Alcohol/week: 0.0 standard drinks  . Drug use: No  . Sexual activity: Not Currently     Birth control/protection: Post-menopausal  Lifestyle  . Physical activity    Days per week: Not on file    Minutes per session: Not on file  . Stress: Not on file  Relationships  . Social Musicianconnections    Talks on phone: Not on file    Gets together: Not on file    Attends religious service: Not on file    Active member of club or organization: Not on file    Attends meetings of clubs or organizations: Not on file    Relationship status: Not on file  . Intimate partner violence    Fear of current or ex partner: Not on file    Emotionally abused: Not on file    Physically abused: Not on file    Forced sexual activity: Not on file  Other Topics Concern  . Not on file  Social History Narrative  . Not on file    Review of Systems: See HPI, otherwise negative ROS  Physical Exam: BP (!) 150/79   Pulse 85   Temp 98.2 F (36.8 C) (Temporal)   Ht 5\' 2"  (1.575 m)   Wt 147 lb 12.8 oz (67 kg)   BMI 27.03 kg/m  General:   Alert,, pleasant and cooperative in NAD Neck:  Supple; no masses or thyromegaly. No significant cervical adenopathy. Lungs:  Clear throughout to auscultation.   No wheezes, crackles, or rhonchi. No acute distress. Heart:  Regular rate and rhythm; no murmurs, clicks, rubs,  or gallops. Abdomen: Non-distended, normal bowel sounds.  Soft and nontender without appreciable mass or hepatosplenomegaly.  Pulses:  Normal pulses noted. Extremities:  Without clubbing or edema.  Impression/Plan: [age] year old lady with a history of GERD and large hiatal hernia.  Dysphagia not an issue these days.  Reflux well controlled on twice daily PPI therapy.  We talked about the pros and cons, risk and benefits of long-term acid suppression therapy versus surgery.  In this particular clinical setting, I feel the benefits of ongoing acid suppression outweigh the risks.  Given her relative advanced age, I do not recommend an attempt at surgical repair of hiatal hernia.  History colonic  adenoma in the past with las 2 colonoscopies negative  -   last being 4 years ago.  No lower GI tract symptoms now aside from constipation.  I do not recommend a future colonoscopy list new symptoms develop   Recommendations:  Continue Nexium 40 mg twice daily (as discussed the benefits outweigh the risks)  GERD and constipation information information provided  Take Benefiber 2 tablespoons every day  Miralax - 1 capful nightly as needed for constipation  OV in 1 year and as needed  As discussed, a future colonoscopy is not recommended unless new symptoms  arise       Notice: This dictation was prepared with Dragon dictation along with smaller phrase technology. Any transcriptional errors that result from this process are unintentional and may not be corrected upon review.

## 2019-06-02 ENCOUNTER — Telehealth: Payer: Self-pay | Admitting: *Deleted

## 2019-06-02 NOTE — Telephone Encounter (Signed)
Prior Auth for Esomeprazole Magnesium 40MG  dr capsules- APPROVED   Key: [id number]   PA Case ID:  PHARMACY AWARE

## 2019-06-02 NOTE — Telephone Encounter (Signed)
Prior Auth for Esomeprazole Magnesium 40MG  dr capsules- In Process  Key: [id number]   PA Case ID:  Your information has been submitted to [employer]. [employer] will review the request and will issue a decision, typically within 1-3 days from your submission. You can check the updated outcome later by reopening this request.  If [employer] has not responded in 1-3 days or if you have any questions about your ePA request, please contact [employer] at [phone]. If you think there may be a problem with your PA request, use our live chat feature at the bottom right.

## 2019-08-18 ENCOUNTER — Ambulatory Visit: Payer: Medicare HMO | Admitting: Family

## 2019-08-24 IMAGING — MG DIGITAL SCREENING BILATERAL MAMMOGRAM WITH TOMO AND CAD
6 of 10 series · 6 of 30 positions shown · non-contrast
Comparison: Previous exam(s).

CLINICAL DATA: Screening.

EXAM:
DIGITAL SCREENING BILATERAL MAMMOGRAM WITH TOMO AND CAD

[L MLO synth-2D]
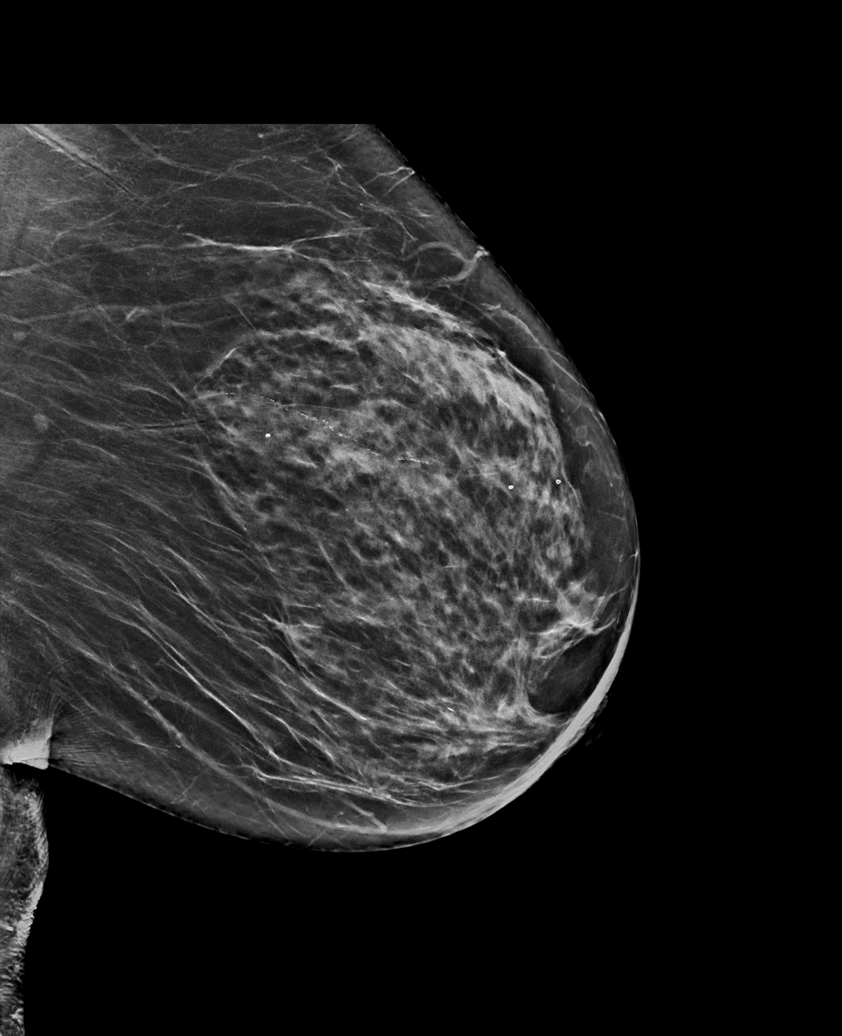

[R MLO synth-2D (1 of 2)]
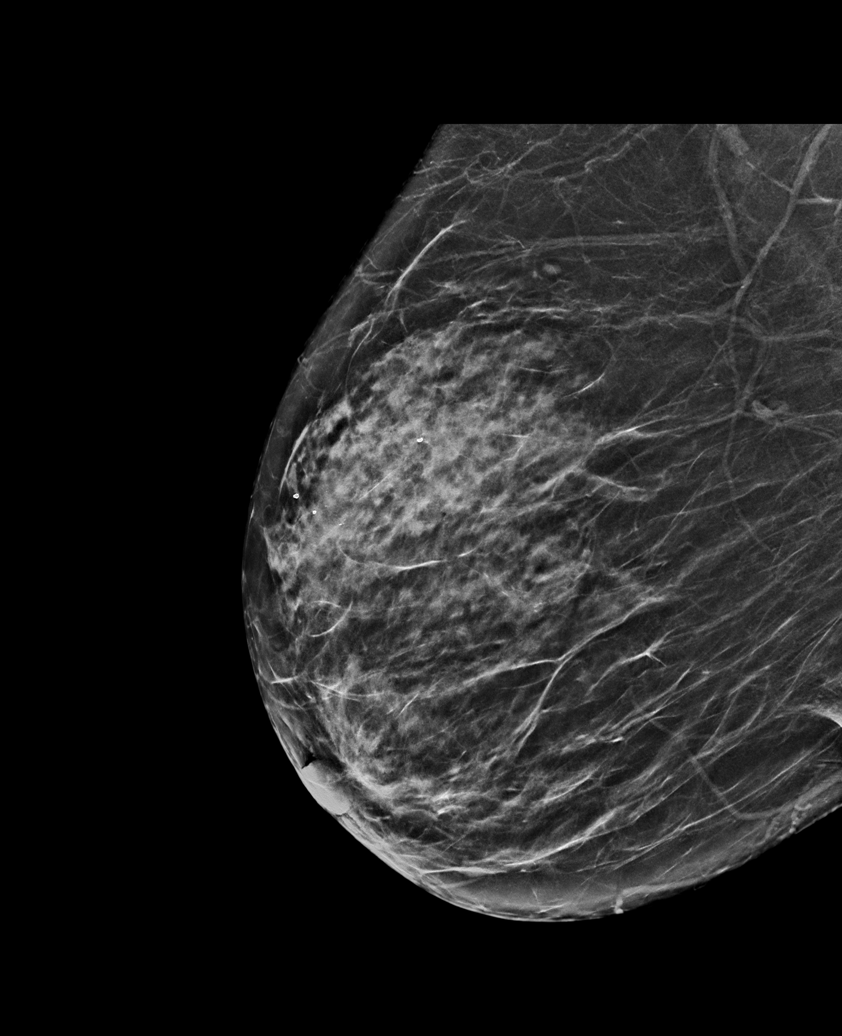

[R CC synth-2D]
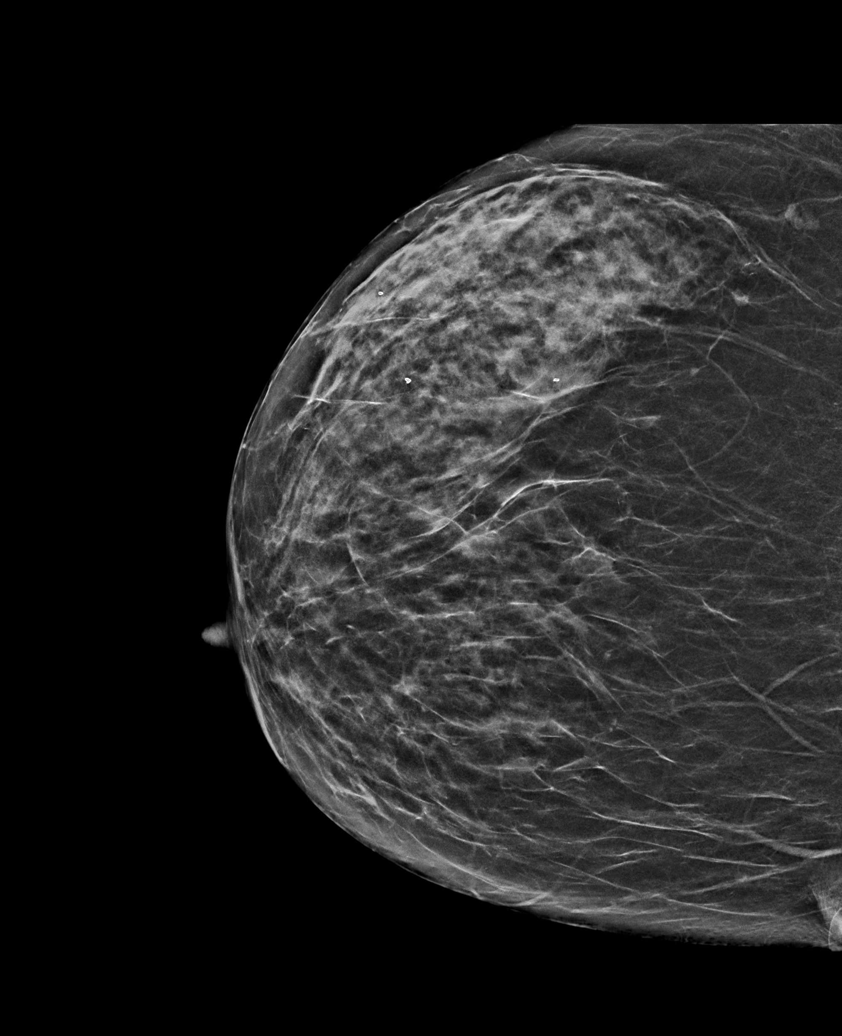

[R MLO synth-2D (2 of 2)]
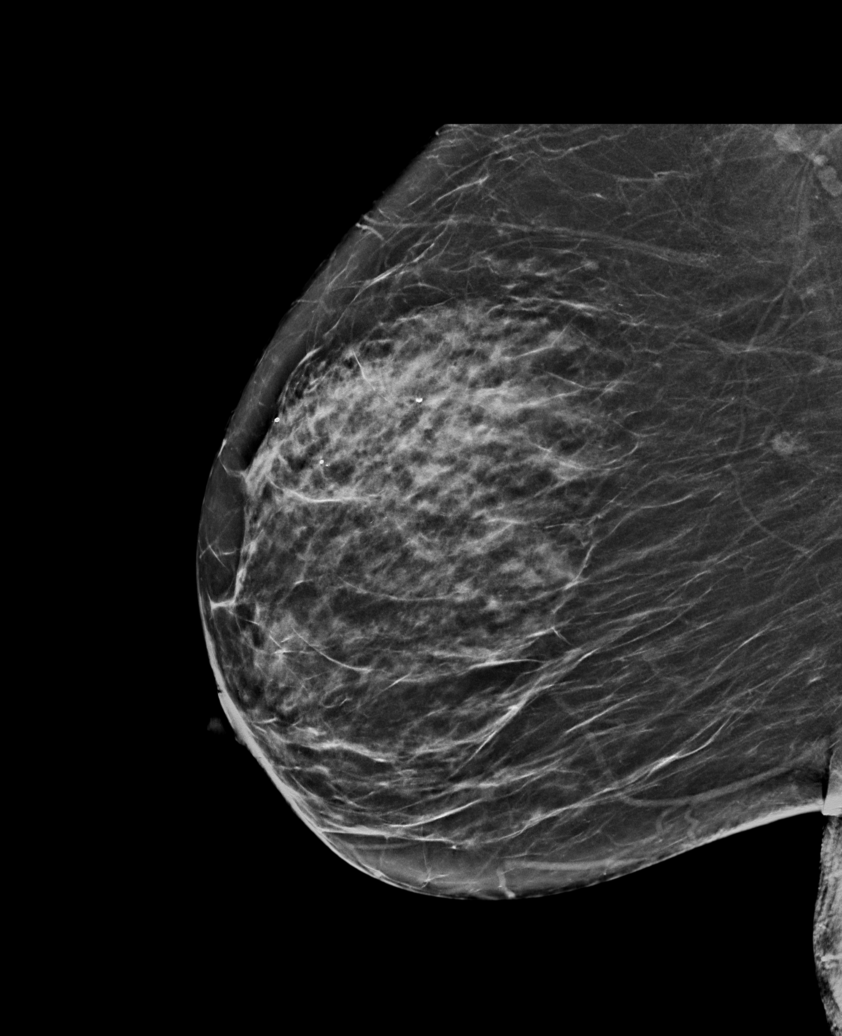

[L CC synth-2D]
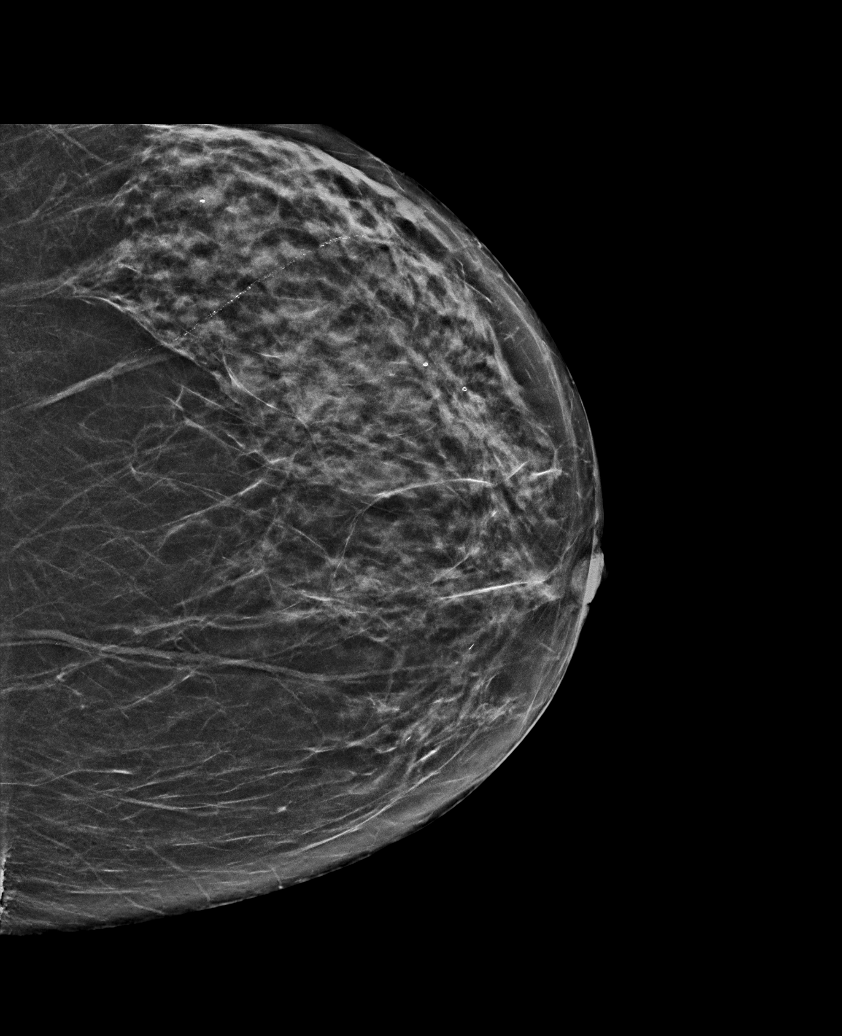

[L CC tomo · tomo slice 29/56.0]
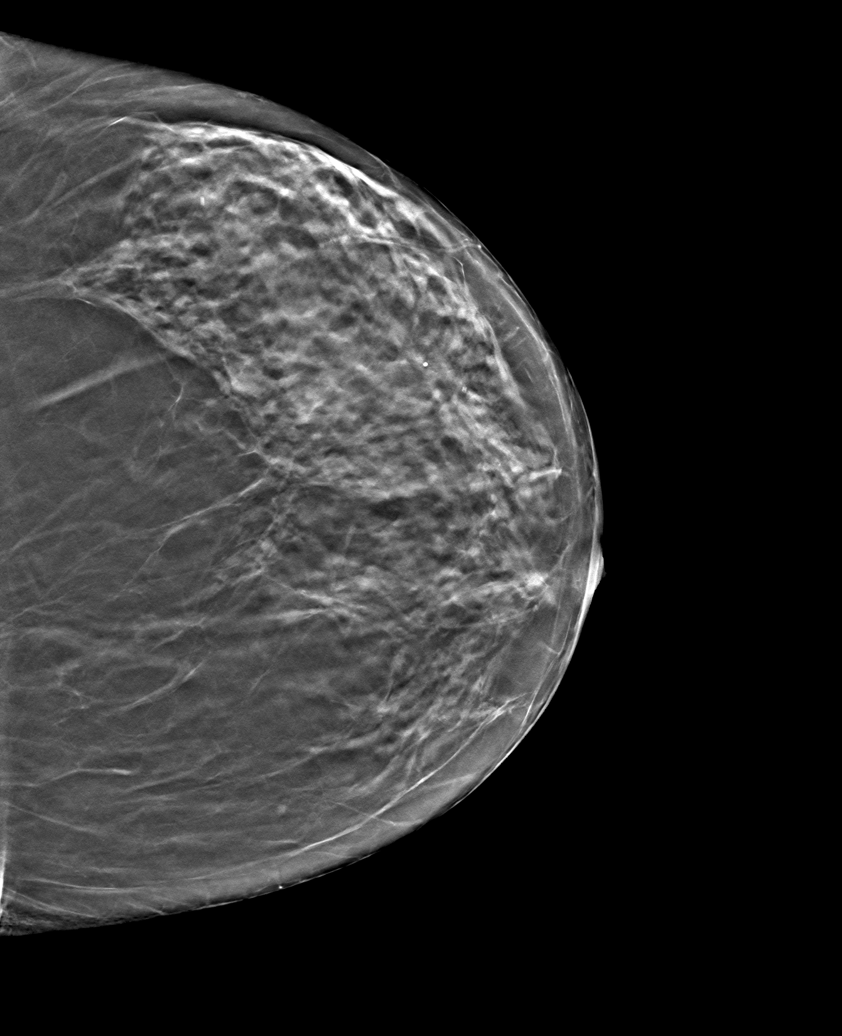

[6 of 30 positions shown; findings below may reference images not displayed]

ACR Breast Density Category c: The breast tissue is heterogeneously
dense, which may obscure small masses.
FINDINGS: There are no findings suspicious for malignancy. Images were
processed with CAD.
IMPRESSION: No mammographic evidence of malignancy. A result letter of this
screening mammogram will be mailed directly to the patient.

RECOMMENDATION:
Screening mammogram in one year. (Code:[id number]-U-LHB)

BI-RADS CATEGORY  1: Negative.

## 2019-08-27 ENCOUNTER — Other Ambulatory Visit: Payer: Self-pay

## 2019-08-27 ENCOUNTER — Ambulatory Visit: Payer: Medicare HMO | Admitting: Family

## 2019-08-27 ENCOUNTER — Encounter: Payer: Self-pay | Admitting: Family

## 2019-08-27 VITALS — BP 130/76 | HR 69 | Temp 97.7°F | Ht 62.0 in | Wt 146.2 lb

## 2019-08-27 DIAGNOSIS — E7800 Pure hypercholesterolemia, unspecified: Secondary | ICD-10-CM

## 2019-08-27 DIAGNOSIS — K2100 Gastro-esophageal reflux disease with esophagitis, without bleeding: Secondary | ICD-10-CM

## 2019-08-27 DIAGNOSIS — I10 Essential (primary) hypertension: Secondary | ICD-10-CM | POA: Diagnosis not present

## 2019-08-27 DIAGNOSIS — Z1159 Encounter for screening for other viral diseases: Secondary | ICD-10-CM

## 2019-08-27 DIAGNOSIS — K449 Diaphragmatic hernia without obstruction or gangrene: Secondary | ICD-10-CM

## 2019-08-27 DIAGNOSIS — E039 Hypothyroidism, unspecified: Secondary | ICD-10-CM

## 2019-08-27 NOTE — Progress Notes (Signed)
Subjective:    Patient ID: Tabitha Fields, female    DOB: 1945-09-18, 74 y.o.   MRN: [id number]  Chief Complaint  Patient presents with  . Medical Management of Chronic Issues   Pt presents to the office today for to establish care. She is followed by GI every 6 months for GERD and hiatal hernia. She states these are stable as long as she eats smaller meals.  Hypertension This is a chronic problem. The current episode started more than 1 year ago. The problem has been resolved since onset. The problem is controlled. Pertinent negatives include no malaise/fatigue, peripheral edema or shortness of breath. Risk factors for coronary artery disease include dyslipidemia, obesity and sedentary lifestyle. The current treatment provides moderate improvement. There is no history of CAD/MI, CVA or heart failure. Identifiable causes of hypertension include a thyroid problem.  Gastroesophageal Reflux She complains of belching, dysphagia and heartburn. This is a chronic problem. The current episode started more than 1 year ago. The problem occurs occasionally. Pertinent negatives include no fatigue. Risk factors include obesity. She has tried a PPI for the symptoms. The treatment provided moderate relief.  Thyroid Problem Presents for follow-up visit. Patient reports no constipation, depressed mood, diaphoresis, dry skin or fatigue. The symptoms have been stable. Her past medical history is significant for hyperlipidemia. There is no history of heart failure.  Hyperlipidemia This is a chronic problem. The current episode started more than 1 year ago. The problem is controlled. Recent lipid tests were reviewed and are normal. Exacerbating diseases include obesity. Pertinent negatives include no shortness of breath. Current antihyperlipidemic treatment includes statins. The current treatment provides moderate improvement of lipids.      Review of Systems  Constitutional: Negative for diaphoresis,  fatigue and malaise/fatigue.  Respiratory: Negative for shortness of breath.   Gastrointestinal: Positive for dysphagia and heartburn. Negative for constipation.  All other systems reviewed and are negative.      Objective:   Physical Exam Vitals reviewed.  Constitutional:      General: She is not in acute distress.    Appearance: She is well-developed.  HENT:     Head: Normocephalic and atraumatic.     Right Ear: Tympanic membrane normal.     Left Ear: Tympanic membrane normal.  Eyes:     Pupils: Pupils are equal, round, and reactive to light.  Neck:     Thyroid: No thyromegaly.  Cardiovascular:     Rate and Rhythm: Normal rate and regular rhythm.     Heart sounds: Normal heart sounds. No murmur.  Pulmonary:     Effort: Pulmonary effort is normal. No respiratory distress.     Breath sounds: Normal breath sounds. No wheezing.  Abdominal:     General: Bowel sounds are normal. There is no distension.     Palpations: Abdomen is soft.     Tenderness: There is no abdominal tenderness.  Musculoskeletal:        General: No tenderness. Normal range of motion.     Cervical back: Normal range of motion and neck supple.  Skin:    General: Skin is warm and dry.  Neurological:     Mental Status: She is alert and oriented to person, place, and time.     Cranial Nerves: No cranial nerve deficit.     Deep Tendon Reflexes: Reflexes are normal and symmetric.  Psychiatric:        Behavior: Behavior normal.        Thought Content: Thought  content normal.        Judgment: Judgment normal.       BP 130/76   Pulse 69   Temp 97.7 F (36.5 C) (Temporal)   Ht _0  (1.575 m)   Wt 146 lb 4 oz (66.3 kg)   BMI 26.75 kg/m      Assessment & Plan:  DESTANEE BEDONIE comes in today with chief complaint of Medical Management of Chronic Issues   Diagnosis and orders addressed:  1. Essential hypertension - CMP14+EGFR - CBC with Differential/Platelet  2. Gastroesophageal reflux  disease with esophagitis without hemorrhage - CMP14+EGFR - CBC with Differential/Platelet  3. Acquired hypothyroidism - CMP14+EGFR - CBC with Differential/Platelet - TSH  4. Pure hypercholesterolemia - CMP14+EGFR - CBC with Differential/Platelet - Lipid panel  5. Hiatal hernia - CMP14+EGFR - CBC with Differential/Platelet  6. Need for hepatitis C screening test - CMP14+EGFR - CBC with Differential/Platelet - Hepatitis C antibody   Labs pending Health Maintenance reviewed Diet and exercise encouraged  Follow up plan: 6 months    Evelina Dun, FNP

## 2019-08-27 NOTE — Patient Instructions (Signed)
Health Maintenance After Age 74 After age 74, you are at a higher risk for certain long-term diseases and infections as well as injuries from falls. Falls are a major cause of broken bones and head injuries in people who are older than age 74. Getting regular preventive care can help to keep you healthy and well. Preventive care includes getting regular testing and making lifestyle changes as recommended by your health care provider. Talk with your health care provider about:  Which screenings and tests you should have. A screening is a test that checks for a disease when you have no symptoms.  A diet and exercise plan that is right for you. What should I know about screenings and tests to prevent falls? Screening and testing are the best ways to find a health problem early. Early diagnosis and treatment give you the best chance of managing medical conditions that are common after age 74. Certain conditions and lifestyle choices may make you more likely to have a fall. Your health care provider may recommend:  Regular vision checks. Poor vision and conditions such as cataracts can make you more likely to have a fall. If you wear glasses, make sure to get your prescription updated if your vision changes.  Medicine review. Work with your health care provider to regularly review all of the medicines you are taking, including over-the-counter medicines. Ask your health care provider about any side effects that may make you more likely to have a fall. Tell your health care provider if any medicines that you take make you feel dizzy or sleepy.  Osteoporosis screening. Osteoporosis is a condition that causes the bones to get weaker. This can make the bones weak and cause them to break more easily.  Blood pressure screening. Blood pressure changes and medicines to control blood pressure can make you feel dizzy.  Strength and balance checks. Your health care provider may recommend certain tests to check your  strength and balance while standing, walking, or changing positions.  Foot health exam. Foot pain and numbness, as well as not wearing proper footwear, can make you more likely to have a fall.  Depression screening. You may be more likely to have a fall if you have a fear of falling, feel emotionally low, or feel unable to do activities that you used to do.  Alcohol use screening. Using too much alcohol can affect your balance and may make you more likely to have a fall. What actions can I take to lower my risk of falls? General instructions  Talk with your health care provider about your risks for falling. Tell your health care provider if: ? You fall. Be sure to tell your health care provider about all falls, even ones that seem minor. ? You feel dizzy, sleepy, or off-balance.  Take over-the-counter and prescription medicines only as told by your health care provider. These include any supplements.  Eat a healthy diet and maintain a healthy weight. A healthy diet includes low-fat dairy products, low-fat (lean) meats, and fiber from whole grains, beans, and lots of fruits and vegetables. Home safety  Remove any tripping hazards, such as rugs, cords, and clutter.  Install safety equipment such as grab bars in bathrooms and safety rails on stairs.  Keep rooms and walkways well-lit. Activity   Follow a regular exercise program to stay fit. This will help you maintain your balance. Ask your health care provider what types of exercise are appropriate for you.  If you need a cane or   walker, use it as recommended by your health care provider.  Wear supportive shoes that have nonskid soles. Lifestyle  Do not drink alcohol if your health care provider tells you not to drink.  If you drink alcohol, limit how much you have: ? 0-1 drink a day for women. ? 0-2 drinks a day for men.  Be aware of how much alcohol is in your drink. In the [place] one drink equals one typical bottle of beer (12  oz), one-half glass of wine (5 oz), or one shot of hard liquor (1 oz).  Do not use any products that contain nicotine or tobacco, such as cigarettes and e-cigarettes. If you need help quitting, ask your health care provider. Summary  Having a healthy lifestyle and getting preventive care can help to protect your health and wellness after age 74.  Screening and testing are the best way to find a health problem early and help you avoid having a fall. Early diagnosis and treatment give you the best chance for managing medical conditions that are more common for people who are older than age 74.  Falls are a major cause of broken bones and head injuries in people who are older than age 74. Take precautions to prevent a fall at home.  Work with your health care provider to learn what changes you can make to improve your health and wellness and to prevent falls. This information is not intended to replace advice given to you by your health care provider. Make sure you discuss any questions you have with your health care provider. Document Revised: 07/10/2018 Document Reviewed: 01/30/2017 Elsevier Patient Education  2020 Elsevier Inc.  

## 2019-08-28 LAB — CBC WITH DIFFERENTIAL/PLATELET
Basophils Absolute: 0 10*3/uL (ref 0.0–0.2)
Basos: 1 %
EOS (ABSOLUTE): 0.1 10*3/uL (ref 0.0–0.4)
Eos: 3 %
Hematocrit: 42.2 % (ref 34.0–46.6)
Hemoglobin: 14.3 g/dL (ref 11.1–15.9)
Immature Grans (Abs): 0 10*3/uL (ref 0.0–0.1)
Immature Granulocytes: 0 %
Lymphocytes Absolute: 1.1 10*3/uL (ref 0.7–3.1)
Lymphs: 23 %
MCH: 30.6 pg (ref 26.6–33.0)
MCHC: 33.9 g/dL (ref 31.5–35.7)
MCV: 90 fL (ref 79–97)
Monocytes Absolute: 0.4 10*3/uL (ref 0.1–0.9)
Monocytes: 8 %
Neutrophils Absolute: 3.3 10*3/uL (ref 1.4–7.0)
Neutrophils: 65 %
Platelets: 218 10*3/uL (ref 150–450)
RBC: 4.68 x10E6/uL (ref 3.77–5.28)
RDW: 13.1 % (ref 11.7–15.4)
WBC: 5 10*3/uL (ref 3.4–10.8)

## 2019-08-28 LAB — CMP14+EGFR
ALT: 16 IU/L (ref 0–32)
AST: 20 IU/L (ref 0–40)
Albumin/Globulin Ratio: 2 (ref 1.2–2.2)
Albumin: 4.6 g/dL (ref 3.7–4.7)
Alkaline Phosphatase: 83 IU/L (ref 48–121)
BUN/Creatinine Ratio: 15 (ref 12–28)
BUN: 11 mg/dL (ref 8–27)
Bilirubin Total: 0.5 mg/dL (ref 0.0–1.2)
CO2: 24 mmol/L (ref 20–29)
Calcium: 9.9 mg/dL (ref 8.7–10.3)
Chloride: 103 mmol/L (ref 96–106)
Creatinine, Ser: 0.72 mg/dL (ref 0.57–1.00)
GFR calc Af Amer: 95 mL/min/{1.73_m2} (ref >59)
GFR calc non Af Amer: 83 mL/min/{1.73_m2} (ref >59)
Globulin, Total: 2.3 g/dL (ref 1.5–4.5)
Glucose: 97 mg/dL (ref 65–99)
Potassium: 5.3 mmol/L — ABNORMAL HIGH (ref 3.5–5.2)
Sodium: 140 mmol/L (ref 134–144)
Total Protein: 6.9 g/dL (ref 6.0–8.5)

## 2019-08-28 LAB — TSH: TSH: 2.29 u[IU]/mL (ref 0.450–4.500)

## 2019-08-28 LAB — HEPATITIS C ANTIBODY: Hep C Virus Ab: <0.1 s/co ratio (ref 0.0–0.9)

## 2019-08-28 LAB — LIPID PANEL
Chol/HDL Ratio: 3.1 ratio (ref 0.0–4.4)
Cholesterol, Total: 147 mg/dL (ref 100–199)
HDL: 47 mg/dL (ref >39)
LDL Chol Calc (NIH): 73 mg/dL (ref 0–99)
Triglycerides: 160 mg/dL — ABNORMAL HIGH (ref 0–149)
VLDL Cholesterol Cal: 27 mg/dL (ref 5–40)

## 2019-12-22 ENCOUNTER — Ambulatory Visit (INDEPENDENT_AMBULATORY_CARE_PROVIDER_SITE_OTHER): Payer: Medicare HMO

## 2019-12-22 ENCOUNTER — Other Ambulatory Visit: Payer: Self-pay

## 2019-12-22 DIAGNOSIS — M8588 Other specified disorders of bone density and structure, other site: Secondary | ICD-10-CM

## 2020-02-15 ENCOUNTER — Other Ambulatory Visit: Payer: Self-pay | Admitting: *Deleted

## 2020-02-15 DIAGNOSIS — K2100 Gastro-esophageal reflux disease with esophagitis, without bleeding: Secondary | ICD-10-CM

## 2020-02-15 DIAGNOSIS — I10 Essential (primary) hypertension: Secondary | ICD-10-CM

## 2020-02-15 DIAGNOSIS — R1319 Other dysphagia: Secondary | ICD-10-CM

## 2020-02-15 DIAGNOSIS — K449 Diaphragmatic hernia without obstruction or gangrene: Secondary | ICD-10-CM

## 2020-02-15 MED ORDER — SIMVASTATIN 40 MG PO TABS: 40.0000 mg | ORAL_TABLET | Freq: Every day | ORAL | 0 refills | Status: DC

## 2020-02-15 MED ORDER — LISINOPRIL 10 MG PO TABS: 10.0000 mg | ORAL_TABLET | Freq: Every day | ORAL | 0 refills | Status: DC

## 2020-02-15 MED ORDER — LEVOTHYROXINE SODIUM 100 MCG PO TABS: 100.0000 ug | ORAL_TABLET | Freq: Every day | ORAL | 1 refills | Status: DC

## 2020-02-15 MED ORDER — ESOMEPRAZOLE MAGNESIUM 40 MG PO CPDR: 40.0000 mg | DELAYED_RELEASE_CAPSULE | Freq: Two times a day (BID) | ORAL | 0 refills | Status: DC

## 2020-03-01 ENCOUNTER — Encounter: Payer: Self-pay | Admitting: Internal Medicine

## 2020-03-08 ENCOUNTER — Other Ambulatory Visit: Payer: Self-pay | Admitting: Family

## 2020-03-08 DIAGNOSIS — K2100 Gastro-esophageal reflux disease with esophagitis, without bleeding: Secondary | ICD-10-CM

## 2020-03-08 DIAGNOSIS — K449 Diaphragmatic hernia without obstruction or gangrene: Secondary | ICD-10-CM

## 2020-03-08 DIAGNOSIS — R1319 Other dysphagia: Secondary | ICD-10-CM

## 2020-03-08 DIAGNOSIS — I10 Essential (primary) hypertension: Secondary | ICD-10-CM

## 2020-03-28 ENCOUNTER — Ambulatory Visit: Payer: Medicare HMO | Admitting: Family

## 2020-03-28 ENCOUNTER — Other Ambulatory Visit: Payer: Self-pay

## 2020-03-28 ENCOUNTER — Encounter: Payer: Self-pay | Admitting: Family

## 2020-03-28 VITALS — BP 134/75 | HR 75 | Temp 97.8°F | Ht 62.0 in | Wt 144.0 lb

## 2020-03-28 DIAGNOSIS — J301 Allergic rhinitis due to pollen: Secondary | ICD-10-CM

## 2020-03-28 DIAGNOSIS — K449 Diaphragmatic hernia without obstruction or gangrene: Secondary | ICD-10-CM

## 2020-03-28 DIAGNOSIS — E7800 Pure hypercholesterolemia, unspecified: Secondary | ICD-10-CM

## 2020-03-28 DIAGNOSIS — I10 Essential (primary) hypertension: Secondary | ICD-10-CM

## 2020-03-28 DIAGNOSIS — Z800 Family history of malignant neoplasm of digestive organs: Secondary | ICD-10-CM

## 2020-03-28 DIAGNOSIS — K2100 Gastro-esophageal reflux disease with esophagitis, without bleeding: Secondary | ICD-10-CM

## 2020-03-28 DIAGNOSIS — E039 Hypothyroidism, unspecified: Secondary | ICD-10-CM

## 2020-03-28 DIAGNOSIS — R1319 Other dysphagia: Secondary | ICD-10-CM | POA: Diagnosis not present

## 2020-03-28 DIAGNOSIS — Z23 Encounter for immunization: Secondary | ICD-10-CM | POA: Diagnosis not present

## 2020-03-28 DIAGNOSIS — Z0001 Encounter for general adult medical examination with abnormal findings: Secondary | ICD-10-CM | POA: Diagnosis not present

## 2020-03-28 DIAGNOSIS — Z0000 Encounter for general adult medical examination without abnormal findings: Secondary | ICD-10-CM

## 2020-03-28 MED ORDER — LISINOPRIL 10 MG PO TABS: ORAL_TABLET | ORAL | 2 refills | Status: DC

## 2020-03-28 MED ORDER — LEVOTHYROXINE SODIUM 100 MCG PO TABS: 100.0000 ug | ORAL_TABLET | Freq: Every day | ORAL | 1 refills | Status: DC

## 2020-03-28 MED ORDER — SIMVASTATIN 40 MG PO TABS: ORAL_TABLET | ORAL | 2 refills | Status: DC

## 2020-03-28 MED ORDER — ESOMEPRAZOLE MAGNESIUM 40 MG PO CPDR: DELAYED_RELEASE_CAPSULE | ORAL | 1 refills | Status: DC

## 2020-03-28 NOTE — Progress Notes (Signed)
Subjective:    Patient ID: Tabitha Fields, female    DOB: 06/03/1945, 74 y.o.   MRN: [id number]  Chief Complaint  Patient presents with  . Hypertension   Pt presents to the office today for CPE and chronic follow up. She is followed by GI every 6 months for GERD and hiatal hernia. She states these are stable as long as she eats smaller meals.  Hypertension This is a chronic problem. The current episode started more than 1 year ago. The problem has been resolved since onset. The problem is controlled. Associated symptoms include malaise/fatigue. Pertinent negatives include no peripheral edema or shortness of breath. Risk factors for coronary artery disease include dyslipidemia, diabetes mellitus, obesity and sedentary lifestyle. The current treatment provides moderate improvement. There is no history of kidney disease. Identifiable causes of hypertension include a thyroid problem.  Gastroesophageal Reflux She complains of belching and heartburn. This is a chronic problem. The current episode started more than 1 year ago. The problem occurs occasionally. The problem has been waxing and waning. Pertinent negatives include no fatigue. She has tried a PPI for the symptoms. The treatment provided moderate relief.  Thyroid Problem Presents for follow-up visit. Patient reports no anxiety, depressed mood, diarrhea or fatigue. The symptoms have been stable. Her past medical history is significant for hyperlipidemia.  Hyperlipidemia This is a chronic problem. The current episode started more than 1 year ago. Pertinent negatives include no shortness of breath. Current antihyperlipidemic treatment includes statins. The current treatment provides moderate improvement of lipids. Risk factors for coronary artery disease include dyslipidemia and hypertension.      Review of Systems  Constitutional: Positive for malaise/fatigue. Negative for fatigue.  Respiratory: Negative for shortness of breath.    Gastrointestinal: Positive for heartburn. Negative for diarrhea.  Psychiatric/Behavioral: The patient is not nervous/anxious.   All other systems reviewed and are negative.  Family History  Problem Relation Age of Onset  . Esophageal cancer Brother 68  . Colon cancer Neg Hx    Social History   Socioeconomic History  . Marital status: Widowed    Spouse name: Not on file  . Number of children: Not on file  . Years of education: Not on file  . Highest education level: Not on file  Occupational History  . Not on file  Tobacco Use  . Smoking status: Never Smoker  . Smokeless tobacco: Never Used  Vaping Use  . Vaping Use: Never used  Substance and Sexual Activity  . Alcohol use: No    Alcohol/week: 0.0 standard drinks  . Drug use: No  . Sexual activity: Not Currently    Birth control/protection: Post-menopausal  Other Topics Concern  . Not on file  Social History Narrative  . Not on file   Social Determinants of Health   Financial Resource Strain: Not on file  Food Insecurity: Not on file  Transportation Needs: Not on file  Physical Activity: Not on file  Stress: Not on file  Social Connections: Not on file       Objective:   Physical Exam Vitals reviewed.  Constitutional:      General: She is not in acute distress.    Appearance: She is well-developed and well-nourished.  HENT:     Head: Normocephalic and atraumatic.     Right Ear: Tympanic membrane normal.     Left Ear: Tympanic membrane normal.     Mouth/Throat:     Mouth: Oropharynx is clear and moist.  Eyes:  Pupils: Pupils are equal, round, and reactive to light.  Neck:     Thyroid: No thyromegaly.  Cardiovascular:     Rate and Rhythm: Normal rate and regular rhythm.     Pulses: Intact distal pulses.     Heart sounds: Normal heart sounds. No murmur heard.   Pulmonary:     Effort: Pulmonary effort is normal. No respiratory distress.     Breath sounds: Normal breath sounds. No wheezing.   Abdominal:     General: Bowel sounds are normal. There is no distension.     Palpations: Abdomen is soft.     Tenderness: There is no abdominal tenderness.  Musculoskeletal:        General: No tenderness or edema. Normal range of motion.     Cervical back: Normal range of motion and neck supple.  Skin:    General: Skin is warm and dry.  Neurological:     Mental Status: She is alert and oriented to person, place, and time.     Cranial Nerves: No cranial nerve deficit.     Deep Tendon Reflexes: Reflexes are normal and symmetric.  Psychiatric:        Mood and Affect: Mood and affect normal.        Behavior: Behavior normal.        Thought Content: Thought content normal.        Judgment: Judgment normal.       BP 134/75   Pulse 75   Temp 97.8 F (36.6 C) (Temporal)   Ht _0  (1.575 m)   Wt 144 lb (65.3 kg)   SpO2 95%   BMI 26.34 kg/m      Assessment & Plan:  Tabitha Fields comes in today with chief complaint of Hypertension   Diagnosis and orders addressed:  1. Esophageal dysphagia - esomeprazole (NEXIUM) 40 MG capsule; TAKE ONE CAPSULE BY MOUTH TWICE DAILY BEFORE A MEAL - NEEDS TO BE SEEN FOR MORE REFILLS  Dispense: 180 capsule; Refill: 1 - CMP14+EGFR - CBC with Differential/Platelet  2. Gastroesophageal reflux disease with esophagitis without hemorrhage - esomeprazole (NEXIUM) 40 MG capsule; TAKE ONE CAPSULE BY MOUTH TWICE DAILY BEFORE A MEAL - NEEDS TO BE SEEN FOR MORE REFILLS  Dispense: 180 capsule; Refill: 1 - CMP14+EGFR - CBC with Differential/Platelet  3. Hiatal hernia - esomeprazole (NEXIUM) 40 MG capsule; TAKE ONE CAPSULE BY MOUTH TWICE DAILY BEFORE A MEAL - NEEDS TO BE SEEN FOR MORE REFILLS  Dispense: 180 capsule; Refill: 1 - CMP14+EGFR - CBC with Differential/Platelet  4. Essential hypertension - lisinopril (ZESTRIL) 10 MG tablet; TAKE 1 TABLET BY MOUTH EVERY DAY -  Dispense: 90 tablet; Refill: 2 - CMP14+EGFR - CBC with  Differential/Platelet  5. Allergic rhinitis due to pollen, unspecified seasonality - CMP14+EGFR - CBC with Differential/Platelet  6. Acquired hypothyroidism - CMP14+EGFR - CBC with Differential/Platelet  7. FHx: esophageal cancer - CMP14+EGFR - CBC with Differential/Platelet - TSH  8. Pure hypercholesterolemia - CMP14+EGFR - CBC with Differential/Platelet - Lipid panel  9. Annual physical exam - CMP14+EGFR - CBC with Differential/Platelet - Lipid panel - TSH   Labs pending Health Maintenance reviewed Diet and exercise encouraged  Follow up plan: 6 months    Evelina Dun, FNP

## 2020-03-28 NOTE — Patient Instructions (Signed)
Health Maintenance After Age 74 After age 74, you are at a higher risk for certain long-term diseases and infections as well as injuries from falls. Falls are a major cause of broken bones and head injuries in people who are older than age 74. Getting regular preventive care can help to keep you healthy and well. Preventive care includes getting regular testing and making lifestyle changes as recommended by your health care provider. Talk with your health care provider about:  Which screenings and tests you should have. A screening is a test that checks for a disease when you have no symptoms.  A diet and exercise plan that is right for you. What should I know about screenings and tests to prevent falls? Screening and testing are the best ways to find a health problem early. Early diagnosis and treatment give you the best chance of managing medical conditions that are common after age 74. Certain conditions and lifestyle choices may make you more likely to have a fall. Your health care provider may recommend:  Regular vision checks. Poor vision and conditions such as cataracts can make you more likely to have a fall. If you wear glasses, make sure to get your prescription updated if your vision changes.  Medicine review. Work with your health care provider to regularly review all of the medicines you are taking, including over-the-counter medicines. Ask your health care provider about any side effects that may make you more likely to have a fall. Tell your health care provider if any medicines that you take make you feel dizzy or sleepy.  Osteoporosis screening. Osteoporosis is a condition that causes the bones to get weaker. This can make the bones weak and cause them to break more easily.  Blood pressure screening. Blood pressure changes and medicines to control blood pressure can make you feel dizzy.  Strength and balance checks. Your health care provider may recommend certain tests to check your  strength and balance while standing, walking, or changing positions.  Foot health exam. Foot pain and numbness, as well as not wearing proper footwear, can make you more likely to have a fall.  Depression screening. You may be more likely to have a fall if you have a fear of falling, feel emotionally low, or feel unable to do activities that you used to do.  Alcohol use screening. Using too much alcohol can affect your balance and may make you more likely to have a fall. What actions can I take to lower my risk of falls? General instructions  Talk with your health care provider about your risks for falling. Tell your health care provider if: ? You fall. Be sure to tell your health care provider about all falls, even ones that seem minor. ? You feel dizzy, sleepy, or off-balance.  Take over-the-counter and prescription medicines only as told by your health care provider. These include any supplements.  Eat a healthy diet and maintain a healthy weight. A healthy diet includes low-fat dairy products, low-fat (lean) meats, and fiber from whole grains, beans, and lots of fruits and vegetables. Home safety  Remove any tripping hazards, such as rugs, cords, and clutter.  Install safety equipment such as grab bars in bathrooms and safety rails on stairs.  Keep rooms and walkways well-lit. Activity   Follow a regular exercise program to stay fit. This will help you maintain your balance. Ask your health care provider what types of exercise are appropriate for you.  If you need a cane or   walker, use it as recommended by your health care provider.  Wear supportive shoes that have nonskid soles. Lifestyle  Do not drink alcohol if your health care provider tells you not to drink.  If you drink alcohol, limit how much you have: ? 0-1 drink a day for women. ? 0-2 drinks a day for men.  Be aware of how much alcohol is in your drink. In the [place] one drink equals one typical bottle of beer (12  oz), one-half glass of wine (5 oz), or one shot of hard liquor (1 oz).  Do not use any products that contain nicotine or tobacco, such as cigarettes and e-cigarettes. If you need help quitting, ask your health care provider. Summary  Having a healthy lifestyle and getting preventive care can help to protect your health and wellness after age 74.  Screening and testing are the best way to find a health problem early and help you avoid having a fall. Early diagnosis and treatment give you the best chance for managing medical conditions that are more common for people who are older than age 74.  Falls are a major cause of broken bones and head injuries in people who are older than age 74. Take precautions to prevent a fall at home.  Work with your health care provider to learn what changes you can make to improve your health and wellness and to prevent falls. This information is not intended to replace advice given to you by your health care provider. Make sure you discuss any questions you have with your health care provider. Document Revised: 07/10/2018 Document Reviewed: 01/30/2017 Elsevier Patient Education  2020 Elsevier Inc.  

## 2020-03-29 LAB — CBC WITH DIFFERENTIAL/PLATELET
Basophils Absolute: 0 10*3/uL (ref 0.0–0.2)
Basos: 1 %
EOS (ABSOLUTE): 0.1 10*3/uL (ref 0.0–0.4)
Eos: 2 %
Hematocrit: 42 % (ref 34.0–46.6)
Hemoglobin: 14.6 g/dL (ref 11.1–15.9)
Immature Grans (Abs): 0 10*3/uL (ref 0.0–0.1)
Immature Granulocytes: 0 %
Lymphocytes Absolute: 1.2 10*3/uL (ref 0.7–3.1)
Lymphs: 21 %
MCH: 30.5 pg (ref 26.6–33.0)
MCHC: 34.8 g/dL (ref 31.5–35.7)
MCV: 88 fL (ref 79–97)
Monocytes Absolute: 0.4 10*3/uL (ref 0.1–0.9)
Monocytes: 7 %
Neutrophils Absolute: 3.9 10*3/uL (ref 1.4–7.0)
Neutrophils: 69 %
Platelets: 219 10*3/uL (ref 150–450)
RBC: 4.78 x10E6/uL (ref 3.77–5.28)
RDW: 13 % (ref 11.7–15.4)
WBC: 5.7 10*3/uL (ref 3.4–10.8)

## 2020-03-29 LAB — CMP14+EGFR
ALT: 16 IU/L (ref 0–32)
AST: 22 IU/L (ref 0–40)
Albumin/Globulin Ratio: 2 (ref 1.2–2.2)
Albumin: 4.7 g/dL (ref 3.7–4.7)
Alkaline Phosphatase: 84 IU/L (ref 44–121)
BUN/Creatinine Ratio: 17 (ref 12–28)
BUN: 11 mg/dL (ref 8–27)
Bilirubin Total: 0.7 mg/dL (ref 0.0–1.2)
CO2: 21 mmol/L (ref 20–29)
Calcium: 9.7 mg/dL (ref 8.7–10.3)
Chloride: 99 mmol/L (ref 96–106)
Creatinine, Ser: 0.66 mg/dL (ref 0.57–1.00)
GFR calc Af Amer: 101 mL/min/{1.73_m2} (ref >59)
GFR calc non Af Amer: 87 mL/min/{1.73_m2} (ref >59)
Globulin, Total: 2.4 g/dL (ref 1.5–4.5)
Glucose: 96 mg/dL (ref 65–99)
Potassium: 4.5 mmol/L (ref 3.5–5.2)
Sodium: 136 mmol/L (ref 134–144)
Total Protein: 7.1 g/dL (ref 6.0–8.5)

## 2020-03-29 LAB — LIPID PANEL
Chol/HDL Ratio: 3.7 ratio (ref 0.0–4.4)
Cholesterol, Total: 148 mg/dL (ref 100–199)
HDL: 40 mg/dL (ref >39)
LDL Chol Calc (NIH): 81 mg/dL (ref 0–99)
Triglycerides: 155 mg/dL — ABNORMAL HIGH (ref 0–149)
VLDL Cholesterol Cal: 27 mg/dL (ref 5–40)

## 2020-03-29 LAB — TSH: TSH: 1.32 u[IU]/mL (ref 0.450–4.500)

## 2020-04-15 ENCOUNTER — Ambulatory Visit (INDEPENDENT_AMBULATORY_CARE_PROVIDER_SITE_OTHER): Payer: Medicare HMO | Admitting: *Deleted

## 2020-04-15 DIAGNOSIS — Z0000 Encounter for general adult medical examination without abnormal findings: Secondary | ICD-10-CM | POA: Diagnosis not present

## 2020-04-15 NOTE — Progress Notes (Signed)
MEDICARE ANNUAL WELLNESS VISIT  04/15/2020  Telephone Visit Disclaimer This Medicare AWV was conducted by telephone due to national recommendations for restrictions regarding the COVID-19 Pandemic (e.g. social distancing).  I verified, using two identifiers, that I am speaking with Tabitha Fields or their authorized healthcare agent. I discussed the limitations, risks, security, and privacy concerns of performing an evaluation and management service by telephone and the potential availability of an in-person appointment in the future. The patient expressed understanding and agreed to proceed.  Location of Patient: Home Location of Provider (nurse):  [facility]  Subjective:    Tabitha Fields is a 75 y.o. female patient of Hawks, Edilia Bo, FNP who had a Medicare Annual Wellness Visit today via telephone. Tabitha Fields is Retired and lives alone. she has 2 children. she reports that she is socially active and does interact with friends/family regularly. she is minimally physically active and enjoys sewing, reading, and spending time with family.  Patient Care Team: Junie Spencer, FNP as PCP - General (Family Medicine) Corbin Ade, MD as Consulting Physician (Gastroenterology)  Advanced Directives 04/15/2020 06/03/2017 03/17/2015 06/09/2014  Does Patient Have a Medical Advance Directive? Yes No No No  Type of Estate agent of [place];Living will - - -  Does patient want to make changes to medical advance directive? No - Patient declined - - -  Copy of Healthcare Power of Attorney in Chart? No - copy requested - - -  Would patient like information on creating a medical advance directive? No - Patient declined No - Patient declined No - patient declined information No - patient declined information    Hospital Utilization Over the Past 12 Months: # of hospitalizations or ER visits: 0 # of surgeries: 0  Review of Systems    Patient  reports that her overall health is unchanged compared to last year.  History obtained from chart review and the patient  Patient Reported Readings (BP, Pulse, CBG, Weight, etc) none  Pain Assessment Pain : No/denies pain     Current Medications & Allergies (verified) Allergies as of 04/15/2020   No Known Allergies     Medication List       Accurate as of April 15, 2020  9:24 AM. If you have any questions, ask your nurse or doctor.        aspirin EC 81 MG tablet Take 81 mg by mouth daily.   BENEFIBER PO Take by mouth daily.   calcium carbonate 1250 MG capsule Take 1,250 mg by mouth daily.   Co Q 10 100 MG Caps Take 200 mg by mouth daily.   esomeprazole 40 MG capsule Commonly known as: NEXIUM TAKE ONE CAPSULE BY MOUTH TWICE DAILY BEFORE A MEAL - NEEDS TO BE SEEN FOR MORE REFILLS   Fish Oil 1200 MG Caps Take 2 capsules by mouth daily.   lactose free nutrition Liqd Take 237 mLs by mouth daily as needed.   levothyroxine 100 MCG tablet Commonly known as: SYNTHROID Take 1 tablet (100 mcg total) by mouth daily.   lisinopril 10 MG tablet Commonly known as: ZESTRIL TAKE 1 TABLET BY MOUTH EVERY DAY -   multivitamin capsule Take 1 capsule by mouth daily.   simvastatin 40 MG tablet Commonly known as: ZOCOR TAKE 1 TABLET BY MOUTH EVERY DAY - NEEDS TO BE SEEN FOR NEXT FILL   VITAMIN C PO Take 1 tablet by mouth daily.       History (  reviewed): Past Medical History:  Diagnosis Date  . GERD (gastroesophageal reflux disease)   . Hypercholesteremia   . Hypertension   . Hypothyroidism   . Thyroid disease    Past Surgical History:  Procedure Laterality Date  . COLONOSCOPY  07/28/04   Dr. Betha Loaourkadenoma  . COLONOSCOPY  2011   Dr. Jena Gaussourk: anal canal hemorhoids, pancolonic diverticula, otherwise normal  . COLONOSCOPY N/A 03/17/2015   DR. Rourk: diverticulosis. consider one last tcs in 5 years for prior h/o tubular adenoma.  . ESOPHAGOGASTRODUODENOSCOPY (EGD)  WITH PROPOFOL N/A 06/06/2017   Dr. Jena Gaussourk: Slightly baggy, atonic tubular esophagus which otherwise appeared normal, widely patent.  Dilated for history of dysphagia.  Large hiatal hernia.  Marland Kitchen. HEMORROIDECTOMY    . MALONEY DILATION N/A 06/06/2017   Procedure: Elease HashimotoMALONEY DILATION;  Surgeon: Corbin Adeourk, Robert M, MD;  Location: [facility];  Service: Endoscopy;  Laterality: N/A;  . TUBAL LIGATION     Family History  Problem Relation Age of Onset  . Diabetes Mother   . Heart disease Mother   . Cancer Father        Bone  . Esophageal cancer Brother 7248  . Heart disease Brother   . Brain cancer Sister   . Heart disease Brother   . Esophageal cancer Brother   . Colon cancer Neg Hx    Social History   Socioeconomic History  . Marital status: Widowed    Spouse name: Not on file  . Number of children: 2  . Years of education: Not on file  . Highest education level: Associate degree: academic program  Occupational History  . Not on file  Tobacco Use  . Smoking status: Never Smoker  . Smokeless tobacco: Never Used  Vaping Use  . Vaping Use: Never used  Substance and Sexual Activity  . Alcohol use: No    Alcohol/week: 0.0 standard drinks  . Drug use: No  . Sexual activity: Not Currently    Birth control/protection: Post-menopausal  Other Topics Concern  . Not on file  Social History Narrative  . Not on file   Social Determinants of Health   Financial Resource Strain: Not on file  Food Insecurity: Not on file  Transportation Needs: Not on file  Physical Activity: Not on file  Stress: Not on file  Social Connections: Not on file    Activities of Daily Living In your present state of health, do you have any difficulty performing the following activities: 04/15/2020  Hearing? N  Vision? N  Comment Wears contacts  Difficulty concentrating or making decisions? N  Walking or climbing stairs? N  Dressing or bathing? N  Doing errands, shopping? N  Preparing Food and eating ? N  Using  the Toilet? N  In the past six months, have you accidently leaked urine? N  Do you have problems with loss of bowel control? N  Managing your Medications? N  Managing your Finances? N  Housekeeping or managing your Housekeeping? N  Some recent data might be hidden    Patient Education/ Literacy How often do you need to have someone help you when you read instructions, pamphlets, or other written materials from your doctor or pharmacy?: 1 - Never What is the last grade level you completed in school?: [employer]  Exercise Current Exercise Habits: Home exercise routine, Type of exercise: walking, Time (Minutes): 20, Frequency (Times/Week): 3, Weekly Exercise (Minutes/Week): 60, Intensity: Mild, Exercise limited by: None identified  Diet Patient reports consuming 3 meals a day  and 1 snack(s) a day Patient reports that her primary diet is: Regular Patient reports that she does have regular access to food.   Depression Screen PHQ 2/9 Scores 04/15/2020 03/28/2020 08/27/2019 03/03/2019 01/03/2018 05/28/2017 11/21/2016  PHQ - 2 Score 0 0 0 0 0 0 0     Fall Risk Fall Risk  04/15/2020 08/27/2019 05/28/2017 11/21/2016 10/04/2016  Falls in the past year? 0 0 No No No     Objective:  Tabitha Fields seemed alert and oriented and she participated appropriately during our telephone visit.  Blood Pressure Weight BMI  BP Readings from Last 3 Encounters:  03/28/20 134/75  08/27/19 130/76  03/06/19 (!) 150/79   Wt Readings from Last 3 Encounters:  03/28/20 144 lb (65.3 kg)  08/27/19 146 lb 4 oz (66.3 kg)  03/06/19 147 lb 12.8 oz (67 kg)   BMI Readings from Last 1 Encounters:  03/28/20 26.34 kg/m    *Unable to obtain current vital signs, weight, and BMI due to telephone visit type  Hearing/Vision  . Tabitha Fields did not seem to have difficulty with hearing/understanding during the telephone conversation . Reports that she has had a formal eye exam by an eye care professional within the past  year . Reports that she has not had a formal hearing evaluation within the past year *Unable to fully assess hearing and vision during telephone visit type  Cognitive Function: 6CIT Screen 04/15/2020  What Year? 0 points  What month? 0 points  What time? 0 points  Count back from 20 0 points  Months in reverse 0 points  Repeat phrase 0 points  Total Score 0   (Normal:0-7, Significant for Dysfunction: >8)  Normal Cognitive Function Screening: Yes   Immunization & Health Maintenance Record Immunization History  Administered Date(s) Administered  . Influenza, High Dose Seasonal PF 12/19/2016, 12/25/2018  . Influenza,inj,quad, With Preservative 12/20/2017  . Influenza-Unspecified 12/28/2019  . Moderna Sars-Covid-2 Vaccination 04/23/2019, 05/29/2019, 02/01/2020  . Pneumococcal Conjugate-13 03/28/2020    Health Maintenance  Topic Date Due  . TETANUS/TDAP  03/28/2021 (Originally 07/15/1964)  . MAMMOGRAM  05/19/2020  . PNA vac Low Risk Adult (2 of 2 - PPSV23) 03/28/2021  . COLONOSCOPY (Pts 45-60yrs Insurance coverage will need to be confirmed)  03/16/2025  . INFLUENZA VACCINE  Completed  . DEXA SCAN  Completed  . COVID-19 Vaccine  Completed  . Hepatitis C Screening  Completed       Assessment  This is a routine wellness examination for Tabitha Fields.  Health Maintenance: Due or Overdue There are no preventive care reminders to display for this patient.  Tabitha Fields does not need a referral for [employer] Assistance: Care Management:   no Social Work:    no Prescription Assistance:  no Nutrition/Diabetes Education:  no   Plan:  Personalized Goals Goals Addressed   None    Personalized Health Maintenance & Screening Recommendations  Screening mammography  Lung Cancer Screening Recommended: no (Low Dose CT Chest recommended if Age 62-80 years, 30 pack-year currently smoking OR have quit w/in past 15 years) Hepatitis C Screening recommended: no HIV  Screening recommended: no  Advanced Directives: Written information was not prepared per patient's request.  Referrals & Orders No orders of the defined types were placed in this encounter.   Follow-up Plan . Follow-up with Junie Spencer, FNP as planned . Schedule mammogram . AVS printed and mailed to patient   I have personally reviewed and noted the following in the patient's  chart:   . Medical and social history . Use of alcohol, tobacco or illicit drugs  . Current medications and supplements . Functional ability and status . Nutritional status . Physical activity . Advanced directives . List of other physicians . Hospitalizations, surgeries, and ER visits in previous 12 months . Vitals . Screenings to include cognitive, depression, and falls . Referrals and appointments  In addition, I have reviewed and discussed with Tabitha Fields certain preventive protocols, quality metrics, and best practice recommendations. A written personalized care plan for preventive services as well as general preventive health recommendations is available and can be mailed to the patient at her request.      Billee Cashing, LPN  07/09/1446

## 2020-04-22 ENCOUNTER — Ambulatory Visit (INDEPENDENT_AMBULATORY_CARE_PROVIDER_SITE_OTHER): Payer: Medicare HMO | Admitting: Family

## 2020-04-22 ENCOUNTER — Encounter: Payer: Self-pay | Admitting: Family

## 2020-04-22 DIAGNOSIS — J019 Acute sinusitis, unspecified: Secondary | ICD-10-CM

## 2020-04-22 MED ORDER — AMOXICILLIN-POT CLAVULANATE 875-125 MG PO TABS: 1.0000 | ORAL_TABLET | Freq: Two times a day (BID) | ORAL | 0 refills | Status: DC

## 2020-04-22 NOTE — Progress Notes (Signed)
   Virtual Visit via telephone Note Due to COVID-19 pandemic this visit was conducted virtually. This visit type was conducted due to national recommendations for restrictions regarding the COVID-19 Pandemic (e.g. social distancing, sheltering in place) in an effort to limit this patient's exposure and mitigate transmission in our community. All issues noted in this document were discussed and addressed.  A physical exam was not performed with this format.  I connected with Tabitha Fields on 04/22/20 at 2:55 pm  by telephone and verified that I am speaking with the correct person using two identifiers. Tabitha Fields is currently located at home  and daughter is currently with her during visit. The provider, Jannifer Rodney, FNP is located in their office at time of visit.  I discussed the limitations, risks, security and privacy concerns of performing an evaluation and management service by telephone and the availability of in person appointments. I also discussed with the patient that there may be a patient responsible charge related to this service. The patient expressed understanding and agreed to proceed.   History and Present Illness:  Sinusitis This is a new problem. The problem has been gradually worsening since onset. There has been no fever. Her pain is at a severity of 9/10. The pain is moderate. Associated symptoms include congestion, ear pain, headaches and sinus pressure (right). Pertinent negatives include no chills, coughing, diaphoresis, shortness of breath, sneezing or sore throat. Past treatments include acetaminophen and oral decongestants. The treatment provided mild relief.      Review of Systems  Constitutional: Negative for chills and diaphoresis.  HENT: Positive for congestion, ear pain and sinus pressure (right). Negative for sneezing and sore throat.   Respiratory: Negative for cough and shortness of breath.   Neurological: Positive for headaches.  All other  systems reviewed and are negative.    Observations/Objective: No SOB or distress noted, nasal congestion present   Assessment and Plan: 1. Acute sinusitis, recurrence not specified, unspecified location - Take meds as prescribed - Use a cool mist humidifier  -Use saline nose sprays frequently -Force fluids -For any cough or congestion  Use plain Mucinex- regular strength or max strength is fine -For fever or aces or pains- take tylenol or ibuprofen. -Throat lozenges if help -Call if symptoms worsen or do not improve  - amoxicillin-clavulanate (AUGMENTIN) 875-125 MG tablet; Take 1 tablet by mouth 2 (two) times daily.  Dispense: 14 tablet; Refill: 0     I discussed the assessment and treatment plan with the patient. The patient was provided an opportunity to ask questions and all were answered. The patient agreed with the plan and demonstrated an understanding of the instructions.   The patient was advised to call back or seek an in-person evaluation if the symptoms worsen or if the condition fails to improve as anticipated.  The above assessment and management plan was discussed with the patient. The patient verbalized understanding of and has agreed to the management plan. Patient is aware to call the clinic if symptoms persist or worsen. Patient is aware when to return to the clinic for a follow-up visit. Patient educated on when it is appropriate to go to the emergency department.   Time call ended:  3:06 pm   I provided 11 minutes of non-face-to-face time during this encounter.    Jannifer Rodney, FNP

## 2020-04-26 ENCOUNTER — Ambulatory Visit: Payer: Medicare HMO | Admitting: Internal Medicine

## 2020-04-26 ENCOUNTER — Encounter: Payer: Self-pay | Admitting: Internal Medicine

## 2020-04-26 ENCOUNTER — Other Ambulatory Visit: Payer: Self-pay

## 2020-04-26 VITALS — BP 129/78 | HR 81 | Temp 97.2°F | Ht 62.0 in | Wt 147.8 lb

## 2020-04-26 DIAGNOSIS — K5909 Other constipation: Secondary | ICD-10-CM | POA: Diagnosis not present

## 2020-04-26 DIAGNOSIS — K2100 Gastro-esophageal reflux disease with esophagitis, without bleeding: Secondary | ICD-10-CM | POA: Diagnosis not present

## 2020-04-26 NOTE — Progress Notes (Signed)
Primary Care Physician:  Junie Spencer, FNP Primary Gastroenterologist:  Dr. Jena Gauss  Pre-Procedure History & Physical: HPI:  Tabitha Fields is a 75 y.o. female here for follow-up of GERD and large hiatal hernia.  Patient notes regurgitation after she eats a moderate sized meal.  Denies true esophageal dysphagia.  Typical reflux symptoms controlled on Nexium 40 mg twice daily.  She has a known large hiatal hernia,  Likely dysmotility in her esophagus as well.  Dysphagia symptoms improved previously with empiric passage of a Maloney dilator.  She has found if she breaks her meals up into smaller "snacks" throughout the day and stays upright which she does better. Has not had any vomiting.  No melena or rectal bleeding.  Occasionally constipated and takes Benefiber with good results ("as needed". History of small adenoma removed previously.  Future colonoscopies not recommended due to age.  Past Medical History:  Diagnosis Date  . GERD (gastroesophageal reflux disease)   . Hypercholesteremia   . Hypertension   . Hypothyroidism   . Thyroid disease     Past Surgical History:  Procedure Laterality Date  . COLONOSCOPY  07/28/04   Dr. Betha Loa  . COLONOSCOPY  2011   Dr. Jena Gauss: anal canal hemorhoids, pancolonic diverticula, otherwise normal  . COLONOSCOPY N/A 03/17/2015   DR. Luan Urbani: diverticulosis. consider one last tcs in 5 years for prior h/o tubular adenoma.  . ESOPHAGOGASTRODUODENOSCOPY (EGD) WITH PROPOFOL N/A 06/06/2017   Dr. Jena Gauss: Slightly baggy, atonic tubular esophagus which otherwise appeared normal, widely patent.  Dilated for history of dysphagia.  Large hiatal hernia.  Marland Kitchen HEMORROIDECTOMY    . MALONEY DILATION N/A 06/06/2017   Procedure: Elease Hashimoto DILATION;  Surgeon: Corbin Ade, MD;  Location: [facility];  Service: Endoscopy;  Laterality: N/A;  . TUBAL LIGATION      Prior to Admission medications   Medication Sig Start Date End Date Taking? Authorizing Provider   Ascorbic Acid (VITAMIN C PO) Take 1 tablet by mouth daily.   Yes [provider]  aspirin EC 81 MG tablet Take 81 mg by mouth daily.   Yes [provider]  calcium carbonate 1250 MG capsule Take 1,250 mg by mouth daily.    Yes [provider]  Coenzyme Q10 (CO Q 10) 100 MG CAPS Take 200 mg by mouth daily.   Yes [provider]  esomeprazole (NEXIUM) 40 MG capsule TAKE ONE CAPSULE BY MOUTH TWICE DAILY BEFORE A MEAL - NEEDS TO BE SEEN FOR MORE REFILLS 03/28/20  Yes Hawks, Christy A, FNP  lactose free nutrition (BOOST) LIQD Take 237 mLs by mouth daily as needed.   Yes [provider]  levothyroxine (SYNTHROID) 100 MCG tablet Take 1 tablet (100 mcg total) by mouth daily. 03/28/20  Yes Hawks, Christy A, FNP  lisinopril (ZESTRIL) 10 MG tablet TAKE 1 TABLET BY MOUTH EVERY DAY - 03/28/20  Yes Hawks, Christy A, FNP  Multiple Vitamin (MULTIVITAMIN) capsule Take 1 capsule by mouth daily.   Yes [provider]  Omega-3 Fatty Acids (FISH OIL) 1200 MG CAPS Take 2 capsules by mouth daily.    Yes [provider]  simvastatin (ZOCOR) 40 MG tablet TAKE 1 TABLET BY MOUTH EVERY DAY - NEEDS TO BE SEEN FOR NEXT FILL 03/28/20  Yes Hawks, Christy A, FNP  Wheat Dextrin (BENEFIBER PO) Take by mouth daily.   Yes [provider]    Allergies as of 04/26/2020  . (No Known Allergies)    Family  History  Problem Relation Age of Onset  . Diabetes Mother   . Heart disease Mother   . Cancer Father        Bone  . Esophageal cancer Brother 73  . Heart disease Brother   . Brain cancer Sister   . Heart disease Brother   . Esophageal cancer Brother   . Colon cancer Neg Hx     Social History   Socioeconomic History  . Marital status: Widowed    Spouse name: Not on file  . Number of children: 2  . Years of education: Not on file  . Highest education level: Associate degree: academic program  Occupational History  . Not on file  Tobacco Use  .  Smoking status: Never Smoker  . Smokeless tobacco: Never Used  Vaping Use  . Vaping Use: Never used  Substance and Sexual Activity  . Alcohol use: No    Alcohol/week: 0.0 standard drinks  . Drug use: No  . Sexual activity: Not Currently    Birth control/protection: Post-menopausal  Other Topics Concern  . Not on file  Social History Narrative  . Not on file   Social Determinants of Health   Financial Resource Strain: Not on file  Food Insecurity: Not on file  Transportation Needs: Not on file  Physical Activity: Not on file  Stress: Not on file  Social Connections: Not on file  Intimate Partner Violence: Not on file    Review of Systems: See HPI, otherwise negative ROS  Physical Exam: BP 129/78   Pulse 81   Temp (!) 97.2 F (36.2 C)   Ht 5\' 2"  (1.575 m)   Wt 147 lb 12.8 oz (67 kg)   BMI 27.03 kg/m  General:   Alert,  Well-developed, well-nourished, pleasant and cooperative in NAD Neck:  Supple; no masses or thyromegaly. No significant cervical adenopathy. Lungs:  Clear throughout to auscultation.   No wheezes, crackles, or rhonchi. No acute distress. Heart:  Regular rate and rhythm; no murmurs, clicks, rubs,  or gallops. Abdomen: Non-distended, normal bowel sounds.  Soft and nontender without appreciable mass or hepatosplenomegaly.  Pulses:  Normal pulses noted. Extremities:  Without clubbing or edema.  Impression/Plan: 75 year old lady with GERD, large hiatal hernia and regurgitation.  Her main symptoms now are that of regurgitation likely coming from her hernia sac. We talked about the anatomy and pathophysiology of her situation. More definitive therapy would be surgery.  However,  with her advanced age and comorbidities this is not recommended. She is already figured out that avoiding large meals at one time will facilitate minimization of her symptoms and gastric emptying. No alarm symptoms.  No need for an EGD at this time. As far as constipation is concerned,  she takes Benefiber with good results but takes it sporadically which is not ideal.  Recommendations:  Continue Nexium 40 mg twice daily 45 minutes before meals  GERD information provided  Break meals into 5-6 snacks per day  Take Benefiber 2 tablespoons everday  As discussed, surgery for  hiatal hernia is not recommended due to the increased risk of surgery  OV here in 9 months       Notice: This dictation was prepared with Dragon dictation along with smaller phrase technology. Any transcriptional errors that result from this process are unintentional and may not be corrected upon review.

## 2020-04-26 NOTE — Patient Instructions (Signed)
Continue Nexium 40 mg twice daily 45 minutes before meals  GERD information provided  Break meals into 5-6 snacks per day  Take Benefiber 2 tablespoons everday  As discussed, surgery for your hiatal hernia is not recommended due to the increased risk of surgery  OV here in 9 months

## 2020-09-14 ENCOUNTER — Ambulatory Visit: Payer: Medicare HMO | Admitting: Family Medicine

## 2020-09-14 ENCOUNTER — Other Ambulatory Visit: Payer: Self-pay

## 2020-09-14 ENCOUNTER — Encounter: Payer: Self-pay | Admitting: Family Medicine

## 2020-09-14 VITALS — BP 119/71 | HR 83 | Temp 97.1°F | Ht 62.0 in | Wt 144.5 lb

## 2020-09-14 DIAGNOSIS — R300 Dysuria: Secondary | ICD-10-CM | POA: Diagnosis not present

## 2020-09-14 DIAGNOSIS — N760 Acute vaginitis: Secondary | ICD-10-CM

## 2020-09-14 LAB — URINALYSIS, ROUTINE W REFLEX MICROSCOPIC
Bilirubin, UA: NEGATIVE
Glucose, UA: NEGATIVE
Ketones, UA: NEGATIVE
Nitrite, UA: NEGATIVE
Protein,UA: NEGATIVE
Specific Gravity, UA: 1.01 (ref 1.005–1.030)
Urobilinogen, Ur: 0.2 mg/dL (ref 0.2–1.0)
pH, UA: 5.5 (ref 5.0–7.5)

## 2020-09-14 LAB — MICROSCOPIC EXAMINATION

## 2020-09-14 LAB — WET PREP FOR TRICH, YEAST, CLUE
Clue Cell Exam: NEGATIVE
Trichomonas Exam: NEGATIVE
Yeast Exam: NEGATIVE

## 2020-09-14 MED ORDER — FLUTICASONE PROPIONATE 0.05 % EX CREA: TOPICAL_CREAM | Freq: Two times a day (BID) | CUTANEOUS | 0 refills | Status: DC

## 2020-09-14 NOTE — Patient Instructions (Signed)

## 2020-09-14 NOTE — Progress Notes (Signed)
Acute Office Visit  Subjective:    Patient ID: Tabitha Fields, female    DOB: 1946-03-13, 75 y.o.   MRN: [id number]  Chief Complaint  Patient presents with   Vaginitis   Dysuria    HPI Patient is in today for vaginal itching and burning x 10 days. She has tried vagisil with benzocaine and retinol that seemed to make things worse. She has tired AZO yeast relief with some relief. Has also tried monistat. She feels like this made her symptoms worse. She also reports dysuria but believes it is just when the urine touches her skin. She reports an increase in her symptoms at night. She reports that her skin feel warm. She has been using cool compresses with some relief. She denies rash, vaginal discharge, fever, chills, nausea, vomiting, hematuria, or flank pain. There is a history of yeast infections. She has taken tylenol for some pain relief.   Past Medical History:  Diagnosis Date   GERD (gastroesophageal reflux disease)    Hypercholesteremia    Hypertension    Hypothyroidism    Thyroid disease     Past Surgical History:  Procedure Laterality Date   COLONOSCOPY  07/28/04   Dr. Betha Loa   COLONOSCOPY  2011   Dr. Jena Gauss: anal canal hemorhoids, pancolonic diverticula, otherwise normal   COLONOSCOPY N/A 03/17/2015   DR. Rourk: diverticulosis. consider one last tcs in 5 years for prior h/o tubular adenoma.   ESOPHAGOGASTRODUODENOSCOPY (EGD) WITH PROPOFOL N/A 06/06/2017   Dr. Jena Gauss: Slightly baggy, atonic tubular esophagus which otherwise appeared normal, widely patent.  Dilated for history of dysphagia.  Large hiatal hernia.   HEMORROIDECTOMY     MALONEY DILATION N/A 06/06/2017   Procedure: Elease Hashimoto DILATION;  Surgeon: Corbin Ade, MD;  Location: [facility];  Service: Endoscopy;  Laterality: N/A;   TUBAL LIGATION      Family History  Problem Relation Age of Onset   Diabetes Mother    Heart disease Mother    Cancer Father        Bone   Esophageal cancer Brother 64    Heart disease Brother    Brain cancer Sister    Heart disease Brother    Esophageal cancer Brother    Colon cancer Neg Hx     Social History   Socioeconomic History   Marital status: Widowed    Spouse name: Not on file   Number of children: 2   Years of education: Not on file   Highest education level: Associate degree: academic program  Occupational History   Not on file  Tobacco Use   Smoking status: Never   Smokeless tobacco: Never  Vaping Use   Vaping Use: Never used  Substance and Sexual Activity   Alcohol use: No    Alcohol/week: 0.0 standard drinks   Drug use: No   Sexual activity: Not Currently    Birth control/protection: Post-menopausal  Other Topics Concern   Not on file  Social History Narrative   Not on file   Social Determinants of Health   Financial Resource Strain: Not on file  Food Insecurity: Not on file  Transportation Needs: Not on file  Physical Activity: Not on file  Stress: Not on file  Social Connections: Not on file  Intimate Partner Violence: Not on file    Outpatient Medications Prior to Visit  Medication Sig Dispense Refill   Ascorbic Acid (VITAMIN C PO) Take 1 tablet by mouth daily.     aspirin EC  81 MG tablet Take 81 mg by mouth daily.     calcium carbonate 1250 MG capsule Take 1,250 mg by mouth daily.      Coenzyme Q10 (CO Q 10) 100 MG CAPS Take 200 mg by mouth daily.     esomeprazole (NEXIUM) 40 MG capsule TAKE ONE CAPSULE BY MOUTH TWICE DAILY BEFORE A MEAL - NEEDS TO BE SEEN FOR MORE REFILLS 180 capsule 1   lactose free nutrition (BOOST) LIQD Take 237 mLs by mouth daily as needed.     levothyroxine (SYNTHROID) 100 MCG tablet Take 1 tablet (100 mcg total) by mouth daily. 90 tablet 1   lisinopril (ZESTRIL) 10 MG tablet TAKE 1 TABLET BY MOUTH EVERY DAY - 90 tablet 2   Misc Natural Products (OSTEO BI-FLEX JOINT SHIELD PO) Take by mouth.     Multiple Vitamin (MULTIVITAMIN) capsule Take 1 capsule by mouth daily.     Omega-3 Fatty  Acids (FISH OIL) 1200 MG CAPS Take 2 capsules by mouth daily.      simvastatin (ZOCOR) 40 MG tablet TAKE 1 TABLET BY MOUTH EVERY DAY - NEEDS TO BE SEEN FOR NEXT FILL 90 tablet 2   Wheat Dextrin (BENEFIBER PO) Take by mouth daily.     No facility-administered medications prior to visit.    No Known Allergies  Review of Systems     Objective:    Physical Exam Vitals and nursing note reviewed.  Constitutional:      General: She is not in acute distress.    Appearance: She is not ill-appearing, toxic-appearing or diaphoretic.  Cardiovascular:     Rate and Rhythm: Normal rate and regular rhythm.     Heart sounds: Normal heart sounds. No murmur heard. Pulmonary:     Effort: Pulmonary effort is normal.     Breath sounds: Normal breath sounds.  Abdominal:     General: Bowel sounds are normal. There is no distension.     Palpations: Abdomen is soft.     Tenderness: There is no abdominal tenderness. There is no right CVA tenderness, left CVA tenderness, guarding or rebound.  Skin:    General: Skin is warm and dry.  Neurological:     Mental Status: She is alert.  Psychiatric:        Mood and Affect: Mood normal.        Behavior: Behavior normal.    BP 119/71   Pulse 83   Temp (!) 97.1 F (36.2 C) (Oral)   Ht 5\' 2"  (1.575 m)   Wt 144 lb 8 oz (65.5 kg)   BMI 26.43 kg/m  Wt Readings from Last 3 Encounters:  09/14/20 144 lb 8 oz (65.5 kg)  04/26/20 147 lb 12.8 oz (67 kg)  03/28/20 144 lb (65.3 kg)   Urine dipstick shows positive for WBC's and positive for RBC's.  Micro exam: 0-5 WBC's per HPF, 0-2 RBC's per HPF, and few+ bacteria.  Microscopic wet-mount exam shows negative for pathogens, normal epithelial cells.   Health Maintenance Due  Topic Date Due   Zoster Vaccines- Shingrix (1 of 2) Never done   COVID-19 Vaccine (4 - Booster for Moderna series) 05/31/2020    There are no preventive care reminders to display for this patient.   Lab Results  Component Value Date    TSH 1.320 03/28/2020   Lab Results  Component Value Date   WBC 5.7 03/28/2020   HGB 14.6 03/28/2020   HCT 42.0 03/28/2020   MCV 88 03/28/2020  PLT 219 03/28/2020   Lab Results  Component Value Date   NA 136 03/28/2020   K 4.5 03/28/2020   CO2 21 03/28/2020   GLUCOSE 96 03/28/2020   BUN 11 03/28/2020   CREATININE 0.66 03/28/2020   BILITOT 0.7 03/28/2020   ALKPHOS 84 03/28/2020   AST 22 03/28/2020   ALT 16 03/28/2020   PROT 7.1 03/28/2020   ALBUMIN 4.7 03/28/2020   CALCIUM 9.7 03/28/2020   ANIONGAP 11 06/03/2017   Lab Results  Component Value Date   CHOL 148 03/28/2020   Lab Results  Component Value Date   HDL 40 03/28/2020   Lab Results  Component Value Date   LDLCALC 81 03/28/2020   Lab Results  Component Value Date   TRIG 155 (H) 03/28/2020   Lab Results  Component Value Date   CHOLHDL 3.7 03/28/2020   No results found for: HGBA1C     Assessment & Plan:   Tabitha Fields was seen today for vaginitis and dysuria.  Diagnoses and all orders for this visit:  Acute vaginitis Negative yeast, trich, and BV. Try cutivate cream as below. Discussed desitin as needed as well. Avoid new products. Handout given. Can continue tylenol and cool compresses. -     WET PREP FOR TRICH, YEAST, CLUE -     fluticasone (CUTIVATE) 0.05 % cream; Apply topically 2 (two) times daily.  Dysuria UA not compelling for UTI. Culture pending, if positive with treat with abx and give diflucan pill as patient develops yeast infections after abx use.  -     Urinalysis, Routine w reflex microscopic -     Urine Culture  Return to office for new or worsening symptoms, or if symptoms persist.   The patient indicates understanding of these issues and agrees with the plan.    Gabriel Earing, FNP

## 2020-09-16 ENCOUNTER — Other Ambulatory Visit: Payer: Self-pay | Admitting: Family

## 2020-09-16 DIAGNOSIS — K449 Diaphragmatic hernia without obstruction or gangrene: Secondary | ICD-10-CM

## 2020-09-16 DIAGNOSIS — R1319 Other dysphagia: Secondary | ICD-10-CM

## 2020-09-16 DIAGNOSIS — K2100 Gastro-esophageal reflux disease with esophagitis, without bleeding: Secondary | ICD-10-CM

## 2020-09-16 LAB — URINE CULTURE: Organism ID, Bacteria: NO GROWTH

## 2020-09-19 ENCOUNTER — Encounter: Payer: Self-pay | Admitting: Family Medicine

## 2020-09-20 ENCOUNTER — Encounter: Payer: Self-pay | Admitting: Family

## 2020-09-20 ENCOUNTER — Ambulatory Visit: Payer: Medicare HMO | Admitting: Family

## 2020-09-20 ENCOUNTER — Other Ambulatory Visit: Payer: Self-pay

## 2020-09-20 ENCOUNTER — Telehealth: Payer: Self-pay | Admitting: Family

## 2020-09-20 VITALS — BP 139/73 | HR 78 | Temp 98.0°F | Ht 62.0 in | Wt 144.5 lb

## 2020-09-20 DIAGNOSIS — N760 Acute vaginitis: Secondary | ICD-10-CM

## 2020-09-20 LAB — WET PREP FOR TRICH, YEAST, CLUE
Clue Cell Exam: NEGATIVE
Trichomonas Exam: NEGATIVE
Yeast Exam: NEGATIVE

## 2020-09-20 MED ORDER — PREMARIN 0.625 MG/GM VA CREA
1.0000 g | TOPICAL_CREAM | Freq: Every day | VAGINAL | 12 refills | Status: DC
Start: 2020-09-20 — End: 2020-10-24

## 2020-09-20 MED ORDER — PREMARIN 0.625 MG/GM VA CREA: 1.0000 | TOPICAL_CREAM | Freq: Every day | VAGINAL | 12 refills | Status: DC

## 2020-09-20 NOTE — Patient Instructions (Signed)

## 2020-09-20 NOTE — Telephone Encounter (Signed)
Prescription sent to pharmacy.

## 2020-09-20 NOTE — Progress Notes (Signed)
   Subjective:    Patient ID: Tabitha Fields, female    DOB: 07-Sep-1945, 75 y.o.   MRN: [id number]  Chief Complaint  Patient presents with   Vaginitis   Pt presents to the office today with vaginal pain and itching. She was seen on 09/14/20 and given cutivate cream without relief. She had a negative urine culture, yeast, and BV.  Vaginal Pain The patient's primary symptoms include genital itching. The patient's pertinent negatives include no vaginal bleeding or vaginal discharge. The problem has been gradually worsening. The pain is moderate. Associated symptoms include dysuria. Pertinent negatives include no fever, flank pain or hematuria. Associated symptoms comments: Area warm to touch.     Review of Systems  Constitutional:  Negative for fever.  Genitourinary:  Positive for dysuria and vaginal pain. Negative for flank pain, hematuria and vaginal discharge.  All other systems reviewed and are negative.     Objective:   Physical Exam Vitals reviewed.  Constitutional:      General: She is not in acute distress.    Appearance: She is well-developed.  HENT:     Head: Normocephalic and atraumatic.  Eyes:     Pupils: Pupils are equal, round, and reactive to light.  Neck:     Thyroid: No thyromegaly.  Cardiovascular:     Rate and Rhythm: Normal rate and regular rhythm.     Heart sounds: Normal heart sounds. No murmur heard. Pulmonary:     Effort: Pulmonary effort is normal. No respiratory distress.     Breath sounds: Normal breath sounds. No wheezing.  Abdominal:     General: Bowel sounds are normal. There is no distension.     Palpations: Abdomen is soft.     Tenderness: There is no abdominal tenderness.  Genitourinary:    Comments: Vaginal redness and irration  Musculoskeletal:        General: No tenderness. Normal range of motion.     Cervical back: Normal range of motion and neck supple.  Skin:    General: Skin is warm and dry.  Neurological:     Mental Status:  She is alert and oriented to person, place, and time.     Cranial Nerves: No cranial nerve deficit.     Deep Tendon Reflexes: Reflexes are normal and symmetric.  Psychiatric:        Behavior: Behavior normal.        Thought Content: Thought content normal.        Judgment: Judgment normal.     BP 139/73   Pulse 78   Temp 98 F (36.7 C) (Oral)   Ht 5\' 2"  (1.575 m)   Wt 144 lb 8 oz (65.5 kg)   BMI 26.43 kg/m       Assessment & Plan:  Tabitha Fields comes in today with chief complaint of Vaginitis   Diagnosis and orders addressed:  1. Acute vaginitis Wet prep negative for yeast Skin does not look like lichen sclerosus and did not respond to steroid cream.  If symptoms continue will need referral to GYN.  - WET PREP FOR TRICH, YEAST, CLUE - conjugated estrogens (PREMARIN) vaginal cream; Place 1 Applicatorful vaginally daily.  Dispense: 42.5 g; Refill: 12     Cresenciano Lick, FNP

## 2020-09-29 ENCOUNTER — Other Ambulatory Visit (HOSPITAL_COMMUNITY): Payer: Self-pay | Admitting: Family

## 2020-09-29 DIAGNOSIS — Z1231 Encounter for screening mammogram for malignant neoplasm of breast: Secondary | ICD-10-CM

## 2020-10-07 ENCOUNTER — Ambulatory Visit (HOSPITAL_COMMUNITY)
Admission: RE | Admit: 2020-10-07 | Discharge: 2020-10-07 | Disposition: A | Discharge status: Home / Self Care | Payer: Medicare HMO | Source: Ambulatory Visit | Attending: Family | Admitting: Family

## 2020-10-07 ENCOUNTER — Other Ambulatory Visit: Payer: Self-pay

## 2020-10-07 DIAGNOSIS — Z1231 Encounter for screening mammogram for malignant neoplasm of breast: Secondary | ICD-10-CM | POA: Insufficient documentation

## 2020-10-24 ENCOUNTER — Other Ambulatory Visit: Payer: Self-pay | Admitting: Family Medicine

## 2020-10-24 NOTE — Addendum Note (Signed)
Addended by: Ignacia Bayley on: 10/24/2020 01:23 PM   Modules accepted: Orders

## 2020-11-10 ENCOUNTER — Encounter: Payer: Self-pay | Admitting: Family

## 2020-11-10 ENCOUNTER — Ambulatory Visit: Payer: Medicare HMO | Admitting: Family

## 2020-11-10 ENCOUNTER — Other Ambulatory Visit: Payer: Self-pay

## 2020-11-10 VITALS — BP 146/77 | HR 71 | Temp 97.8°F | Ht 62.0 in | Wt 143.4 lb

## 2020-11-10 DIAGNOSIS — I10 Essential (primary) hypertension: Secondary | ICD-10-CM

## 2020-11-10 DIAGNOSIS — E039 Hypothyroidism, unspecified: Secondary | ICD-10-CM

## 2020-11-10 DIAGNOSIS — K2100 Gastro-esophageal reflux disease with esophagitis, without bleeding: Secondary | ICD-10-CM | POA: Diagnosis not present

## 2020-11-10 DIAGNOSIS — R1319 Other dysphagia: Secondary | ICD-10-CM | POA: Diagnosis not present

## 2020-11-10 DIAGNOSIS — K449 Diaphragmatic hernia without obstruction or gangrene: Secondary | ICD-10-CM

## 2020-11-10 DIAGNOSIS — E7800 Pure hypercholesterolemia, unspecified: Secondary | ICD-10-CM

## 2020-11-10 MED ORDER — SIMVASTATIN 40 MG PO TABS: ORAL_TABLET | ORAL | 2 refills | Status: DC

## 2020-11-10 MED ORDER — LEVOTHYROXINE SODIUM 100 MCG PO TABS: 100.0000 ug | ORAL_TABLET | Freq: Every day | ORAL | 1 refills | Status: DC

## 2020-11-10 MED ORDER — LISINOPRIL 10 MG PO TABS: ORAL_TABLET | ORAL | 2 refills | Status: DC

## 2020-11-10 MED ORDER — ESOMEPRAZOLE MAGNESIUM 40 MG PO CPDR: DELAYED_RELEASE_CAPSULE | ORAL | 1 refills | Status: DC

## 2020-11-10 NOTE — Patient Instructions (Signed)

## 2020-11-10 NOTE — Progress Notes (Signed)
Subjective:    Patient ID: Tabitha Fields, female    DOB: 1945-05-07, 75 y.o.   MRN: [id number]  Chief Complaint  Patient presents with   Medical Management of Chronic Issues    Wants her labs+ thyroid    Pt presents to the office today for chronic follow up. She is followed by GI every 6 months for GERD and hiatal hernia. She states these are stable as long as she eats smaller meals.  Hypertension This is a chronic problem. The current episode started more than 1 year ago. The problem has been waxing and waning since onset. The problem is uncontrolled. Pertinent negatives include no malaise/fatigue, palpitations, peripheral edema or shortness of breath. Risk factors for coronary artery disease include dyslipidemia and obesity. The current treatment provides moderate improvement. There is no history of heart failure. Identifiable causes of hypertension include a thyroid problem.  Gastroesophageal Reflux She complains of belching and heartburn. She reports no hoarse voice. This is a chronic problem. The current episode started more than 1 year ago. The problem occurs occasionally. Associated symptoms include fatigue. Risk factors include obesity. She has tried a PPI for the symptoms. The treatment provided moderate relief.  Thyroid Problem Presents for follow-up visit. Symptoms include fatigue. Patient reports no depressed mood, diarrhea, hoarse voice or palpitations. The symptoms have been stable. Her past medical history is significant for hyperlipidemia. There is no history of heart failure.  Hyperlipidemia This is a chronic problem. The current episode started more than 1 year ago. The problem is controlled. Recent lipid tests were reviewed and are normal. Exacerbating diseases include obesity. Pertinent negatives include no shortness of breath. Current antihyperlipidemic treatment includes statins. The current treatment provides moderate improvement of lipids. Risk factors for coronary  artery disease include dyslipidemia, hypertension, a sedentary lifestyle and post-menopausal.     Review of Systems  Constitutional:  Positive for fatigue. Negative for malaise/fatigue.  HENT:  Negative for hoarse voice.   Respiratory:  Negative for shortness of breath.   Cardiovascular:  Negative for palpitations.  Gastrointestinal:  Positive for heartburn. Negative for diarrhea.  All other systems reviewed and are negative.     Objective:   Physical Exam Vitals reviewed.  Constitutional:      General: She is not in acute distress.    Appearance: She is well-developed. She is obese.  HENT:     Head: Normocephalic and atraumatic.     Right Ear: Tympanic membrane normal.     Left Ear: Tympanic membrane normal.  Eyes:     Pupils: Pupils are equal, round, and reactive to light.  Neck:     Thyroid: No thyromegaly.  Cardiovascular:     Rate and Rhythm: Normal rate and regular rhythm.     Heart sounds: Normal heart sounds. No murmur heard. Pulmonary:     Effort: Pulmonary effort is normal. No respiratory distress.     Breath sounds: Normal breath sounds. No wheezing.  Abdominal:     General: Bowel sounds are normal. There is no distension.     Palpations: Abdomen is soft.     Tenderness: There is no abdominal tenderness.  Musculoskeletal:        General: No tenderness. Normal range of motion.     Cervical back: Normal range of motion and neck supple.  Skin:    General: Skin is warm and dry.  Neurological:     Mental Status: She is alert and oriented to person, place, and time.  Cranial Nerves: No cranial nerve deficit.     Deep Tendon Reflexes: Reflexes are normal and symmetric.  Psychiatric:        Behavior: Behavior normal.        Thought Content: Thought content normal.        Judgment: Judgment normal.         BP (!) 146/77   Pulse 71   Temp 97.8 F (36.6 C) (Temporal)   Ht _0  (1.575 m)   Wt 143 lb 6.4 oz (65 kg)   SpO2 97%   BMI 26.23 kg/m    Assessment & Plan:  Tabitha Fields comes in today with chief complaint of Medical Management of Chronic Issues (Wants her labs+ thyroid )   Diagnosis and orders addressed:  1. Essential hypertension - lisinopril (ZESTRIL) 10 MG tablet; TAKE 1 TABLET BY MOUTH EVERY DAY -  Dispense: 90 tablet; Refill: 2 - CMP14+EGFR  2. Esophageal dysphagia - esomeprazole (NEXIUM) 40 MG capsule; TAKE 1 CAPSULE BY MOUTH TWICE DAILY BEFORE a meal  Dispense: 180 capsule; Refill: 1 - CMP14+EGFR  3. Gastroesophageal reflux disease with esophagitis without hemorrhage  - esomeprazole (NEXIUM) 40 MG capsule; TAKE 1 CAPSULE BY MOUTH TWICE DAILY BEFORE a meal  Dispense: 180 capsule; Refill: 1 - CMP14+EGFR  4. Hiatal hernia - esomeprazole (NEXIUM) 40 MG capsule; TAKE 1 CAPSULE BY MOUTH TWICE DAILY BEFORE a meal  Dispense: 180 capsule; Refill: 1 - CMP14+EGFR  5. Acquired hypothyroidism - CMP14+EGFR - TSH  6. Pure hypercholesterolemia - CMP14+EGFR   Labs pending Health Maintenance reviewed Diet and exercise encouraged  Follow up plan: 6 months   Evelina Dun, FNP

## 2020-11-11 LAB — CMP14+EGFR
ALT: 14 IU/L (ref 0–32)
AST: 22 IU/L (ref 0–40)
Albumin/Globulin Ratio: 1.7 (ref 1.2–2.2)
Albumin: 4.5 g/dL (ref 3.7–4.7)
Alkaline Phosphatase: 89 IU/L (ref 44–121)
BUN/Creatinine Ratio: 18 (ref 12–28)
BUN: 11 mg/dL (ref 8–27)
Bilirubin Total: 0.7 mg/dL (ref 0.0–1.2)
CO2: 22 mmol/L (ref 20–29)
Calcium: 9.9 mg/dL (ref 8.7–10.3)
Chloride: 101 mmol/L (ref 96–106)
Creatinine, Ser: 0.6 mg/dL (ref 0.57–1.00)
Globulin, Total: 2.6 g/dL (ref 1.5–4.5)
Glucose: 95 mg/dL (ref 65–99)
Potassium: 4.8 mmol/L (ref 3.5–5.2)
Sodium: 138 mmol/L (ref 134–144)
Total Protein: 7.1 g/dL (ref 6.0–8.5)
eGFR: 94 mL/min/{1.73_m2} (ref >59)

## 2020-11-11 LAB — TSH: TSH: 2.36 u[IU]/mL (ref 0.450–4.500)

## 2020-12-05 ENCOUNTER — Other Ambulatory Visit: Payer: Self-pay | Admitting: Family

## 2020-12-13 ENCOUNTER — Encounter: Payer: Self-pay | Admitting: Internal Medicine

## 2020-12-27 ENCOUNTER — Ambulatory Visit: Payer: Medicare HMO | Admitting: Internal Medicine

## 2020-12-27 ENCOUNTER — Encounter: Payer: Self-pay | Admitting: Internal Medicine

## 2020-12-27 ENCOUNTER — Other Ambulatory Visit: Payer: Self-pay

## 2020-12-27 VITALS — BP 144/81 | HR 84 | Temp 97.3°F | Ht 62.0 in | Wt 145.8 lb

## 2020-12-27 DIAGNOSIS — K2100 Gastro-esophageal reflux disease with esophagitis, without bleeding: Secondary | ICD-10-CM | POA: Diagnosis not present

## 2020-12-27 DIAGNOSIS — K5909 Other constipation: Secondary | ICD-10-CM

## 2020-12-27 NOTE — Patient Instructions (Signed)
It was good seeing you again today!  Continue Nexium 40 mg twice daily indefinitely as the benefits greatly outweigh any theoretical risks as discussed  Continue Benefiber 1 to 2 tablespoons daily  No further GI evaluation needed at this time  We will see you back in 1 year and as needed.

## 2020-12-27 NOTE — Progress Notes (Signed)
Primary Care Physician:  Junie Spencer, FNP Primary Gastroenterologist:  Dr. Jena Gauss  Pre-Procedure History & Physical: HPI:  Tabitha Fields is a 75 y.o. female here for follow-up of GERD/large hiatal hernia and constipation.  She is watching her diet doing very well on Nexium 40 mg twice daily.  She has tried to go to once daily in the past but that did not work.  No dysphagia.  No nausea or vomiting.  She is pleased with her progress thus far.  Taking 1-2 tablespoons of Benefiber daily with good results 1-2 bowel movements daily.  No melena or rectal bleeding.  Past Medical History:  Diagnosis Date   GERD (gastroesophageal reflux disease)    Hypercholesteremia    Hypertension    Hypothyroidism    Thyroid disease     Past Surgical History:  Procedure Laterality Date   COLONOSCOPY  07/28/04   Dr. Betha Loa   COLONOSCOPY  2011   Dr. Jena Gauss: anal canal hemorhoids, pancolonic diverticula, otherwise normal   COLONOSCOPY N/A 03/17/2015   DR. Hogan Hoobler: diverticulosis. consider one last tcs in 5 years for prior h/o tubular adenoma.   ESOPHAGOGASTRODUODENOSCOPY (EGD) WITH PROPOFOL N/A 06/06/2017   Dr. Jena Gauss: Slightly baggy, atonic tubular esophagus which otherwise appeared normal, widely patent.  Dilated for history of dysphagia.  Large hiatal hernia.   HEMORROIDECTOMY     MALONEY DILATION N/A 06/06/2017   Procedure: Elease Hashimoto DILATION;  Surgeon: Corbin Ade, MD;  Location: [facility];  Service: Endoscopy;  Laterality: N/A;   TUBAL LIGATION      Prior to Admission medications   Medication Sig Start Date End Date Taking? Authorizing Provider  Ascorbic Acid (VITAMIN C PO) Take 1 tablet by mouth daily.   Yes [provider]  aspirin EC 81 MG tablet Take 81 mg by mouth daily.   Yes [provider]  calcium carbonate 1250 MG capsule Take 1,250 mg by mouth daily.    Yes [provider]  Coenzyme Q10 (CO Q 10) 100 MG CAPS Take 200 mg by mouth daily.   Yes  [provider]  esomeprazole (NEXIUM) 40 MG capsule TAKE 1 CAPSULE BY MOUTH TWICE DAILY BEFORE a meal 11/10/20  Yes Hawks, Christy A, FNP  levothyroxine (SYNTHROID) 100 MCG tablet Take 1 tablet (100 mcg total) by mouth daily. 11/10/20  Yes Hawks, Christy A, FNP  lisinopril (ZESTRIL) 10 MG tablet TAKE 1 TABLET BY MOUTH EVERY DAY - 11/10/20  Yes Hawks, Edilia Bo, FNP  Misc Natural Products (OSTEO BI-FLEX JOINT SHIELD PO) Take by mouth.   Yes [provider]  Multiple Vitamin (MULTIVITAMIN) capsule Take 1 capsule by mouth daily.   Yes [provider]  Omega-3 Fatty Acids (FISH OIL) 1200 MG CAPS Take 2 capsules by mouth daily.    Yes [provider]  simvastatin (ZOCOR) 40 MG tablet TAKE 1 TABLET BY MOUTH EVERY DAY - NEEDS TO BE SEEN FOR NEXT FILL 11/10/20  Yes Hawks, Christy A, FNP  Wheat Dextrin (BENEFIBER PO) Take by mouth daily.   Yes [provider]    Allergies as of 12/27/2020   (No Known Allergies)    Family History  Problem Relation Age of Onset   Diabetes Mother    Heart disease Mother    Cancer Father        Bone   Esophageal cancer Brother 53   Heart disease Brother    Brain cancer Sister    Heart disease Brother  Esophageal cancer Brother    Colon cancer Neg Hx     Social History   Socioeconomic History   Marital status: Widowed    Spouse name: Not on file   Number of children: 2   Years of education: Not on file   Highest education level: Associate degree: academic program  Occupational History   Not on file  Tobacco Use   Smoking status: Never   Smokeless tobacco: Never  Vaping Use   Vaping Use: Never used  Substance and Sexual Activity   Alcohol use: No    Alcohol/week: 0.0 standard drinks   Drug use: No   Sexual activity: Not Currently    Birth control/protection: Post-menopausal  Other Topics Concern   Not on file  Social History Narrative   Not on file   Social Determinants of Health   Financial  Resource Strain: Not on file  Food Insecurity: Not on file  Transportation Needs: Not on file  Physical Activity: Not on file  Stress: Not on file  Social Connections: Not on file  Intimate Partner Violence: Not on file    Review of Systems: See HPI, otherwise negative ROS  Physical Exam: BP (!) 144/81   Pulse 84   Temp (!) 97.3 F (36.3 C)   Ht 5\' 2"  (1.575 m)   Wt 145 lb 12.8 oz (66.1 kg)   BMI 26.67 kg/m  General:   Alert,  Well-developed, well-nourished, pleasant and cooperative in NAD Abdomen: Non-distended, normal bowel sounds.  Soft and nontender without appreciable mass or hepatosplenomegaly.   Impression/Plan: Very pleasant 75 year old lady with a large hiatal hernia and secondary reflux.  Doing well on twice daily PPI therapy and diet.  Constipation well managed with Benefiber daily.  She has tried to back off to once a day on her Nexium previously but had significant breakthrough symptoms.  She is likely going to require higher dose acid suppression therapy indefinitely given the presence of a large hiatal hernia.  We talked about the pros and cons of this approach.  In this clinical setting, the benefits of twice PPI therapy far outweigh any theoretical risks.  Recommendations  Continue Nexium 40 mg twice daily indefinitely as the benefits greatly outweigh any theoretical risks as discussed  Continue Benefiber 1 to 2 tablespoons daily  No further GI evaluation needed at this time  We will see her back in 1 year and as needed.  Notice: This dictation was prepared with Dragon dictation along with smaller phrase technology. Any transcriptional errors that result from this process are unintentional and may not be corrected upon review.

## 2021-04-18 ENCOUNTER — Ambulatory Visit (INDEPENDENT_AMBULATORY_CARE_PROVIDER_SITE_OTHER): Payer: Medicare HMO

## 2021-04-18 VITALS — Ht 62.0 in | Wt 144.0 lb

## 2021-04-18 DIAGNOSIS — Z0000 Encounter for general adult medical examination without abnormal findings: Secondary | ICD-10-CM

## 2021-04-18 NOTE — Progress Notes (Signed)
Subjective:   Tabitha LickMargaret W All is a 76 y.o. female who presents for Medicare Annual (Subsequent) preventive examination.  Virtual Visit via Telephone Note  I connected with  Tabitha Fields on 04/18/21 at  2:45 PM EST by telephone and verified that I am speaking with the correct person using two identifiers.  Location: Patient: Home Provider: [facility] Persons participating in the virtual visit: patient/Nurse Health Advisor   I discussed the limitations, risks, security and privacy concerns of performing an evaluation and management service by telephone and the availability of in person appointments. The patient expressed understanding and agreed to proceed.  Interactive audio and video telecommunications were attempted between this nurse and patient, however failed, due to patient having technical difficulties OR patient did not have access to video capability.  We continued and completed visit with audio only.  Some vital signs may be absent or patient reported.   Asiah Browder E Jenny Omdahl, LPN   Review of Systems     Cardiac Risk Factors include: advanced age (>4155men, 7>65 women);dyslipidemia;hypertension;sedentary lifestyle     Objective:    Today's Vitals   04/18/21 1431  Weight: 144 lb (65.3 kg)  Height: 5\' 2"  (1.575 m)   Body mass index is 26.34 kg/m.  Advanced Directives 04/18/2021 04/15/2020 06/03/2017 03/17/2015 06/09/2014  Does Patient Have a Medical Advance Directive? Yes Yes No No No  Type of Estate agentAdvance Directive Healthcare Power of [place];Living will Healthcare Power of [place]orney;Living will - - -  Does patient want to make changes to medical advance directive? - No - Patient declined - - -  Copy of Healthcare Power of Attorney in Chart? No - copy requested No - copy requested - - -  Would patient like information on creating a medical advance directive? - No - Patient declined No - Patient declined No - patient declined information No - patient declined information     Current Medications (verified) Outpatient Encounter Medications as of 04/18/2021  Medication Sig   Ascorbic Acid (VITAMIN C PO) Take 1 tablet by mouth daily.   aspirin EC 81 MG tablet Take 81 mg by mouth daily.   calcium carbonate 1250 MG capsule Take 1,250 mg by mouth daily.    Coenzyme Q10 (CO Q 10) 100 MG CAPS Take 200 mg by mouth daily.   esomeprazole (NEXIUM) 40 MG capsule TAKE 1 CAPSULE BY MOUTH TWICE DAILY BEFORE a meal   levothyroxine (SYNTHROID) 100 MCG tablet Take 1 tablet (100 mcg total) by mouth daily.   lisinopril (ZESTRIL) 10 MG tablet TAKE 1 TABLET BY MOUTH EVERY DAY -   Misc Natural Products (OSTEO BI-FLEX JOINT SHIELD PO) Take by mouth.   Multiple Vitamin (MULTIVITAMIN) capsule Take 1 capsule by mouth daily.   Omega-3 Fatty Acids (FISH OIL) 1200 MG CAPS Take 2 capsules by mouth daily.    simvastatin (ZOCOR) 40 MG tablet TAKE 1 TABLET BY MOUTH EVERY DAY - NEEDS TO BE SEEN FOR NEXT FILL   Wheat Dextrin (BENEFIBER PO) Take by mouth daily.   No facility-administered encounter medications on file as of 04/18/2021.    Allergies (verified) Patient has no known allergies.   History: Past Medical History:  Diagnosis Date   GERD (gastroesophageal reflux disease)    Hypercholesteremia    Hypertension    Hypothyroidism    Thyroid disease    Past Surgical History:  Procedure Laterality Date   COLONOSCOPY  07/28/04   Dr. Betha Loaourkadenoma   COLONOSCOPY  2011   Dr. Jena Gaussourk: anal canal hemorhoids,  pancolonic diverticula, otherwise normal   COLONOSCOPY N/A 03/17/2015   DR. Rourk: diverticulosis. consider one last tcs in 5 years for prior h/o tubular adenoma.   ESOPHAGOGASTRODUODENOSCOPY (EGD) WITH PROPOFOL N/A 06/06/2017   Dr. Jena Gauss: Slightly baggy, atonic tubular esophagus which otherwise appeared normal, widely patent.  Dilated for history of dysphagia.  Large hiatal hernia.   HEMORROIDECTOMY     MALONEY DILATION N/A 06/06/2017   Procedure: Elease Hashimoto DILATION;  Surgeon: Corbin Ade, MD;  Location: [facility];  Service: Endoscopy;  Laterality: N/A;   TUBAL LIGATION     Family History  Problem Relation Age of Onset   Diabetes Mother    Heart disease Mother    Cancer Father        Bone   Esophageal cancer Brother 20   Heart disease Brother    Brain cancer Sister    Heart disease Brother    Esophageal cancer Brother    Colon cancer Neg Hx    Social History   Socioeconomic History   Marital status: Widowed    Spouse name: Not on file   Number of children: 2   Years of education: Not on file   Highest education level: Associate degree: academic program  Occupational History   Occupation: retired  Tobacco Use   Smoking status: Never   Smokeless tobacco: Never  Vaping Use   Vaping Use: Never used  Substance and Sexual Activity   Alcohol use: No    Alcohol/week: 0.0 standard drinks   Drug use: No   Sexual activity: Not Currently    Birth control/protection: Post-menopausal  Other Topics Concern   Not on file  Social History Narrative   Lives alone on one level   Has friends and family nearby   Social Determinants of Health   Financial Resource Strain: Low Risk    Difficulty of Paying Living Expenses: Not hard at all  Food Insecurity: No Food Insecurity   Worried About Programme researcher, broadcasting/film/video in the Last Year: Never true   Barista in the Last Year: Never true  Transportation Needs: No Transportation Needs   Lack of Transportation (Medical): No   Lack of Transportation (Non-Medical): No  Physical Activity: Insufficiently Active   Days of Exercise per Week: 7 days   Minutes of Exercise per Session: 20 min  Stress: No Stress Concern Present   Feeling of Stress : Not at all  Social Connections: Moderately Isolated   Frequency of Communication with Friends and Family: More than three times a week   Frequency of Social Gatherings with Friends and Family: More than three times a week   Attends Religious Services: Never   Automotive engineer or Organizations: No   Attends Engineer, structural: Never   Marital Status: Married    Tobacco Counseling Counseling given: Not Answered   Clinical Intake:  Pre-visit preparation completed: Yes  Pain : No/denies pain     BMI - recorded: 26.34 Nutritional Status: BMI 25 -29 Overweight Nutritional Risks: None Diabetes: No  How often do you need to have someone help you when you read instructions, pamphlets, or other written materials from your doctor or pharmacy?: 1 - Never  Diabetic? no  Interpreter Needed?: No  Information entered by :: Griffon Herberg, LPN   Activities of Daily Living In your present state of health, do you have any difficulty performing the following activities: 04/18/2021  Hearing? N  Vision? N  Difficulty concentrating or  making decisions? N  Walking or climbing stairs? N  Dressing or bathing? N  Doing errands, shopping? N  Preparing Food and eating ? N  Using the Toilet? N  In the past six months, have you accidently leaked urine? Y  Comment mild  Do you have problems with loss of bowel control? N  Managing your Medications? N  Managing your Finances? N  Housekeeping or managing your Housekeeping? N  Some recent data might be hidden    Patient Care Team: Junie SpencerHawks, Christy A, FNP as PCP - General (Family Medicine) Jena Gaussourk, Gerrit Friendsobert M, MD as Consulting Physician (Gastroenterology)  Indicate any recent Medical Services you may have received from other than [facility] providers in the past year (date may be approximate).     Assessment:   This is a routine wellness examination for [facility].  Hearing/Vision screen Hearing Screening - Comments:: Denies hearing difficulties  Vision Screening - Comments:: Wears contacts and rx glasses- [place] [facility] [place]  Dietary issues and exercise activities discussed: Current Exercise Habits: Home exercise routine, Type of exercise: walking, Time (Minutes): 20, Frequency  (Times/Week): 7, Weekly Exercise (Minutes/Week): 140, Intensity: Mild, Exercise limited by: None identified   Goals Addressed             This Visit's Progress    AWV   On track    04/15/2020 AWV Goal: Fall Prevention  Over the next year, patient will decrease their risk for falls by: Using assistive devices, such as a cane or walker, as needed Identifying fall risks within their home and correcting them by: Removing throw rugs Adding handrails to stairs or ramps Removing clutter and keeping a clear pathway throughout the home Increasing light, especially at night Adding shower handles/bars Raising toilet seat Identifying potential personal risk factors for falls: Medication side effects Incontinence/urgency Vestibular dysfunction Hearing loss Musculoskeletal disorders Neurological disorders Orthostatic hypotension         Depression Screen PHQ 2/9 Scores 04/18/2021 11/10/2020 09/20/2020 09/14/2020 04/15/2020 03/28/2020 08/27/2019  PHQ - 2 Score 0 0 0 0 0 0 0  PHQ- 9 Score - 0 0 0 - - -    Fall Risk Fall Risk  04/18/2021 11/10/2020 09/20/2020 09/14/2020 04/15/2020  Falls in the past year? 0 0 0 0 0  Number falls in past yr: 0 - - - -  Injury with Fall? 0 - - - -  Risk for fall due to : No Fall Risks No Fall Risks - - -  Follow up Falls prevention discussed Education provided - - -    FALL RISK PREVENTION PERTAINING TO THE HOME:  Any stairs in or around the home? No  If so, are there any without handrails? No  Home free of loose throw rugs in walkways, pet beds, electrical cords, etc? Yes  Adequate lighting in your home to reduce risk of falls? Yes   ASSISTIVE DEVICES UTILIZED TO PREVENT FALLS:  Life alert? No  Use of a cane, walker or w/c? No  Grab bars in the bathroom? Yes  Shower chair or bench in shower? Yes  Elevated toilet seat or a handicapped toilet? Yes   TIMED UP AND GO:  Was the test performed? No . Telephonic visit  Cognitive Function: Normal  cognitive status assessed by direct observation by this Nurse Health Advisor. No abnormalities found.       6CIT Screen 04/15/2020  What Year? 0 points  What month? 0 points  What time? 0 points  Count back  from 20 0 points  Months in reverse 0 points  Repeat phrase 0 points  Total Score 0    Immunizations Immunization History  Administered Date(s) Administered   Influenza, High Dose Seasonal PF 12/19/2016, 12/25/2018   Influenza,inj,quad, With Preservative 12/20/2017   Influenza-Unspecified 12/28/2019, 01/11/2021   Moderna Sars-Covid-2 Vaccination 04/23/2019, 05/29/2019, 02/01/2020, 11/14/2020, 02/14/2021   Pneumococcal Conjugate-13 03/28/2020   Pneumococcal Polysaccharide-23 07/14/2012   Tdap 03/29/2011   Zoster, Live 10/02/2011    TDAP status: Due, Education has been provided regarding the importance of this vaccine. Advised may receive this vaccine at local pharmacy or Health Dept. Aware to provide a copy of the vaccination record if obtained from local pharmacy or Health Dept. Verbalized acceptance and understanding.  Flu Vaccine status: Up to date  Pneumococcal vaccine status: Up to date  Covid-19 vaccine status: Completed vaccines  Qualifies for Shingles Vaccine? Yes   Zostavax completed Yes   Shingrix Completed?: No.    Education has been provided regarding the importance of this vaccine. Patient has been advised to call insurance company to determine out of pocket expense if they have not yet received this vaccine. Advised may also receive vaccine at local pharmacy or Health Dept. Verbalized acceptance and understanding.  Screening Tests Health Maintenance  Topic Date Due   COLONOSCOPY (Pts 45-84yrs Insurance coverage will need to be confirmed)  03/16/2020   TETANUS/TDAP  03/28/2021   Zoster Vaccines- Shingrix (1 of 2) 07/17/2021 (Originally 07/16/1995)   MAMMOGRAM  10/07/2021   DEXA SCAN  12/21/2021   Pneumonia Vaccine 40+ Years old  Completed   INFLUENZA  VACCINE  Completed   COVID-19 Vaccine  Completed   Hepatitis C Screening  Completed   HPV VACCINES  Aged Out    Health Maintenance  Health Maintenance Due  Topic Date Due   COLONOSCOPY (Pts 45-61yrs Insurance coverage will need to be confirmed)  03/16/2020   TETANUS/TDAP  03/28/2021    Colorectal cancer screening: Type of screening: Colonoscopy. Completed 03/17/2015. Repeat every 5 years  Mammogram status: Completed 10/07/2020. Repeat every year  Bone Density status: Completed 12/22/2019. Results reflect: Bone density results: OSTEOPENIA. Repeat every 2 years.  Lung Cancer Screening: (Low Dose CT Chest recommended if Age 34-80 years, 30 pack-year currently smoking OR have quit w/in 15years.) does not qualify.   Additional Screening:  Hepatitis C Screening: does qualify; Completed 08/27/2019  Vision Screening: Recommended annual ophthalmology exams for early detection of glaucoma and other disorders of the eye. Is the patient up to date with their annual eye exam?  Yes  Who is the provider or what is the name of the office in which the patient attends annual eye exams? [facility] in [place] If pt is not established with a provider, would they like to be referred to a provider to establish care? No .   Dental Screening: Recommended annual dental exams for proper oral hygiene  Community Resource Referral / Chronic Care Management: CRR required this visit?  No   CCM required this visit?  No      Plan:     I have personally reviewed and noted the following in the patients chart:   Medical and social history Use of alcohol, tobacco or illicit drugs  Current medications and supplements including opioid prescriptions.  Functional ability and status Nutritional status Physical activity Advanced directives List of other physicians Hospitalizations, surgeries, and ER visits in previous 12 months Vitals Screenings to include cognitive, depression, and falls Referrals and  appointments  In addition,  I have reviewed and discussed with patient certain preventive protocols, quality metrics, and best practice recommendations. A written personalized care plan for preventive services as well as general preventive health recommendations were provided to patient.     [place] Constable, LPN   11/09/1749   Nurse Notes: None

## 2021-04-18 NOTE — Patient Instructions (Signed)
Tabitha Fields , Thank you for taking time to come for your Medicare Wellness Visit. I appreciate your ongoing commitment to your health goals. Please review the following plan we discussed and let me know if I can assist you in the future.   Screening recommendations/referrals: Colonoscopy: Done 03/17/2015 - Repeat in 5 years *due Mammogram: Done 10/07/2020 - Repeat annually  Bone Density: done 12/22/2019 - Repeat every 2 years  Recommended yearly ophthalmology/optometry visit for glaucoma screening and checkup Recommended yearly dental visit for hygiene and checkup  Vaccinations: Influenza vaccine: Done 01/11/2021 - Repeat annually  Pneumococcal vaccine: Done 07/14/2012 & 03/28/2020 Tdap vaccine: Done 03/29/2011 - Repeat in 10 years *due Shingles vaccine: Zostavax done 2013 - declines Shingrix   Covid-19: Done 04/23/2019, 05/29/2019, 02/01/2020, 11/14/2020, & 02/14/2021  Advanced directives: Please bring a copy of your health care power of attorney and living will to the office to be added to your chart at your convenience.   Conditions/risks identified: Aim for 30 minutes of exercise or brisk walking each day, drink 6-8 glasses of water and eat lots of fruits and vegetables.   Next appointment: Follow up in one year for your annual wellness visit    Preventive Care 65 Years and Older, Female Preventive care refers to lifestyle choices and visits with your health care provider that can promote health and wellness. What does preventive care include? A yearly physical exam. This is also called an annual well check. Dental exams once or twice a year. Routine eye exams. Ask your health care provider how often you should have your eyes checked. Personal lifestyle choices, including: Daily care of your teeth and gums. Regular physical activity. Eating a healthy diet. Avoiding tobacco and drug use. Limiting alcohol use. Practicing safe sex. Taking low-dose aspirin every day. Taking vitamin and  mineral supplements as recommended by your health care provider. What happens during an annual well check? The services and screenings done by your health care provider during your annual well check will depend on your age, overall health, lifestyle risk factors, and family history of disease. Counseling  Your health care provider may ask you questions about your: Alcohol use. Tobacco use. Drug use. Emotional well-being. Home and relationship well-being. Sexual activity. Eating habits. History of falls. Memory and ability to understand (cognition). Work and work Astronomerenvironment. Reproductive health. Screening  You may have the following tests or measurements: Height, weight, and BMI. Blood pressure. Lipid and cholesterol levels. These may be checked every 5 years, or more frequently if you are over 73102 years old. Skin check. Lung cancer screening. You may have this screening every year starting at age 76 if you have a 30-pack-year history of smoking and currently smoke or have quit within the past 15 years. Fecal occult blood test (FOBT) of the stool. You may have this test every year starting at age 76. Flexible sigmoidoscopy or colonoscopy. You may have a sigmoidoscopy every 5 years or a colonoscopy every 10 years starting at age 76. Hepatitis C blood test. Hepatitis B blood test. Sexually transmitted disease (STD) testing. Diabetes screening. This is done by checking your blood sugar (glucose) after you have not eaten for a while (fasting). You may have this done every 1-3 years. Bone density scan. This is done to screen for osteoporosis. You may have this done starting at age 76. Mammogram. This may be done every 1-2 years. Talk to your health care provider about how often you should have regular mammograms. Talk with your health care  provider about your test results, treatment options, and if necessary, the need for more tests. Vaccines  Your health care provider may recommend  certain vaccines, such as: Influenza vaccine. This is recommended every year. Tetanus, diphtheria, and acellular pertussis (Tdap, Td) vaccine. You may need a Td booster every 10 years. Zoster vaccine. You may need this after age 69. Pneumococcal 13-valent conjugate (PCV13) vaccine. One dose is recommended after age 45. Pneumococcal polysaccharide (PPSV23) vaccine. One dose is recommended after age 42. Talk to your health care provider about which screenings and vaccines you need and how often you need them. This information is not intended to replace advice given to you by your health care provider. Make sure you discuss any questions you have with your health care provider. Document Released: 04/15/2015 Document Revised: 12/07/2015 Document Reviewed: 01/18/2015 Elsevier Interactive Patient Education  2017 [employer].  Fall Prevention in the Home Falls can cause injuries. They can happen to people of all ages. There are many things you can do to make your home safe and to help prevent falls. What can I do on the outside of my home? Regularly fix the edges of walkways and driveways and fix any cracks. Remove anything that might make you trip as you walk through a door, such as a raised step or threshold. Trim any bushes or trees on the path to your home. Use bright outdoor lighting. Clear any walking paths of anything that might make someone trip, such as rocks or tools. Regularly check to see if handrails are loose or broken. Make sure that both sides of any steps have handrails. Any raised decks and porches should have guardrails on the edges. Have any leaves, snow, or ice cleared regularly. Use sand or salt on walking paths during winter. Clean up any spills in your garage right away. This includes oil or grease spills. What can I do in the bathroom? Use night lights. Install grab bars by the toilet and in the tub and shower. Do not use towel bars as grab bars. Use non-skid mats or  decals in the tub or shower. If you need to sit down in the shower, use a plastic, non-slip stool. Keep the floor dry. Clean up any water that spills on the floor as soon as it happens. Remove soap buildup in the tub or shower regularly. Attach bath mats securely with double-sided non-slip rug tape. Do not have throw rugs and other things on the floor that can make you trip. What can I do in the bedroom? Use night lights. Make sure that you have a light by your bed that is easy to reach. Do not use any sheets or blankets that are too big for your bed. They should not hang down onto the floor. Have a firm chair that has side arms. You can use this for support while you get dressed. Do not have throw rugs and other things on the floor that can make you trip. What can I do in the kitchen? Clean up any spills right away. Avoid walking on wet floors. Keep items that you use a lot in easy-to-reach places. If you need to reach something above you, use a strong step stool that has a grab bar. Keep electrical cords out of the way. Do not use floor polish or wax that makes floors slippery. If you must use wax, use non-skid floor wax. Do not have throw rugs and other things on the floor that can make you trip. What can I  do with my stairs? Do not leave any items on the stairs. Make sure that there are handrails on both sides of the stairs and use them. Fix handrails that are broken or loose. Make sure that handrails are as long as the stairways. Check any carpeting to make sure that it is firmly attached to the stairs. Fix any carpet that is loose or worn. Avoid having throw rugs at the top or bottom of the stairs. If you do have throw rugs, attach them to the floor with carpet tape. Make sure that you have a light switch at the top of the stairs and the bottom of the stairs. If you do not have them, ask someone to add them for you. What else can I do to help prevent falls? Wear shoes that: Do not  have high heels. Have rubber bottoms. Are comfortable and fit you well. Are closed at the toe. Do not wear sandals. If you use a stepladder: Make sure that it is fully opened. Do not climb a closed stepladder. Make sure that both sides of the stepladder are locked into place. Ask someone to hold it for you, if possible. Clearly mark and make sure that you can see: Any grab bars or handrails. First and last steps. Where the edge of each step is. Use tools that help you move around (mobility aids) if they are needed. These include: Canes. Walkers. Scooters. Crutches. Turn on the lights when you go into a dark area. Replace any light bulbs as soon as they burn out. Set up your furniture so you have a clear path. Avoid moving your furniture around. If any of your floors are uneven, fix them. If there are any pets around you, be aware of where they are. Review your medicines with your doctor. Some medicines can make you feel dizzy. This can increase your chance of falling. Ask your doctor what other things that you can do to help prevent falls. This information is not intended to replace advice given to you by your health care provider. Make sure you discuss any questions you have with your health care provider. Document Released: 01/13/2009 Document Revised: 08/25/2015 Document Reviewed: 04/23/2014 Elsevier Interactive Patient Education  2017 [employer].

## 2021-05-05 ENCOUNTER — Encounter: Payer: Self-pay | Admitting: Family

## 2021-05-05 ENCOUNTER — Ambulatory Visit: Payer: Medicare HMO | Admitting: Family

## 2021-05-05 VITALS — BP 130/73 | HR 71 | Temp 97.6°F | Ht 62.0 in | Wt 146.4 lb

## 2021-05-05 DIAGNOSIS — E7800 Pure hypercholesterolemia, unspecified: Secondary | ICD-10-CM

## 2021-05-05 DIAGNOSIS — Z23 Encounter for immunization: Secondary | ICD-10-CM

## 2021-05-05 DIAGNOSIS — E039 Hypothyroidism, unspecified: Secondary | ICD-10-CM

## 2021-05-05 DIAGNOSIS — I10 Essential (primary) hypertension: Secondary | ICD-10-CM

## 2021-05-05 DIAGNOSIS — K2100 Gastro-esophageal reflux disease with esophagitis, without bleeding: Secondary | ICD-10-CM | POA: Diagnosis not present

## 2021-05-05 LAB — CMP14+EGFR
ALT: 13 IU/L (ref 0–32)
AST: 19 IU/L (ref 0–40)
Albumin/Globulin Ratio: 2 (ref 1.2–2.2)
Albumin: 4.7 g/dL (ref 3.7–4.7)
Alkaline Phosphatase: 81 IU/L (ref 44–121)
BUN/Creatinine Ratio: 20 (ref 12–28)
BUN: 12 mg/dL (ref 8–27)
Bilirubin Total: 0.7 mg/dL (ref 0.0–1.2)
CO2: 24 mmol/L (ref 20–29)
Calcium: 9.9 mg/dL (ref 8.7–10.3)
Chloride: 99 mmol/L (ref 96–106)
Creatinine, Ser: 0.6 mg/dL (ref 0.57–1.00)
Globulin, Total: 2.3 g/dL (ref 1.5–4.5)
Glucose: 100 mg/dL — ABNORMAL HIGH (ref 70–99)
Potassium: 4.7 mmol/L (ref 3.5–5.2)
Sodium: 137 mmol/L (ref 134–144)
Total Protein: 7 g/dL (ref 6.0–8.5)
eGFR: 94 mL/min/{1.73_m2} (ref >59)

## 2021-05-05 LAB — CBC WITH DIFFERENTIAL/PLATELET
Basophils Absolute: 0 10*3/uL (ref 0.0–0.2)
Basos: 1 %
EOS (ABSOLUTE): 0.1 10*3/uL (ref 0.0–0.4)
Eos: 2 %
Hematocrit: 43.1 % (ref 34.0–46.6)
Hemoglobin: 14.8 g/dL (ref 11.1–15.9)
Immature Grans (Abs): 0 10*3/uL (ref 0.0–0.1)
Immature Granulocytes: 0 %
Lymphocytes Absolute: 1.4 10*3/uL (ref 0.7–3.1)
Lymphs: 25 %
MCH: 30.5 pg (ref 26.6–33.0)
MCHC: 34.3 g/dL (ref 31.5–35.7)
MCV: 89 fL (ref 79–97)
Monocytes Absolute: 0.4 10*3/uL (ref 0.1–0.9)
Monocytes: 7 %
Neutrophils Absolute: 3.7 10*3/uL (ref 1.4–7.0)
Neutrophils: 65 %
Platelets: 216 10*3/uL (ref 150–450)
RBC: 4.85 x10E6/uL (ref 3.77–5.28)
RDW: 13.2 % (ref 11.7–15.4)
WBC: 5.6 10*3/uL (ref 3.4–10.8)

## 2021-05-05 LAB — TSH: TSH: 2.71 u[IU]/mL (ref 0.450–4.500)

## 2021-05-05 NOTE — Addendum Note (Signed)
Addended by: Austin Miles F on: 05/05/2021 10:05 AM   Modules accepted: Orders

## 2021-05-05 NOTE — Progress Notes (Signed)
Subjective:    Patient ID: Tabitha Fields, female    DOB: 07-21-1945, 76 y.o.   MRN: [id number]  Chief Complaint  Patient presents with   Medical Management of Chronic Issues   Pt presents to the office today for chronic follow up. She is followed by GI every 6 months for GERD and hiatal hernia. She states these are stable as long as she eats smaller meals.  Hypertension This is a chronic problem. The current episode started more than 1 year ago. The problem has been resolved since onset. The problem is controlled. Pertinent negatives include no malaise/fatigue, peripheral edema or shortness of breath. Risk factors for coronary artery disease include dyslipidemia and obesity. The current treatment provides moderate improvement. Identifiable causes of hypertension include a thyroid problem.  Gastroesophageal Reflux She complains of belching and heartburn. This is a chronic problem. The current episode started more than 1 year ago. The problem occurs occasionally. The problem has been waxing and waning. The symptoms are aggravated by certain foods. Pertinent negatives include no fatigue. Risk factors include obesity. She has tried a PPI for the symptoms. The treatment provided moderate relief.  Thyroid Problem Presents for follow-up visit. Patient reports no anxiety, constipation, depressed mood or fatigue. The symptoms have been stable. Her past medical history is significant for hyperlipidemia.  Hyperlipidemia This is a chronic problem. The current episode started more than 1 year ago. The problem is controlled. Recent lipid tests were reviewed and are normal. Pertinent negatives include no shortness of breath. Current antihyperlipidemic treatment includes statins. The current treatment provides moderate improvement of lipids. Risk factors for coronary artery disease include dyslipidemia, hypertension, post-menopausal and a sedentary lifestyle.     Review of Systems  Constitutional:   Negative for fatigue and malaise/fatigue.  Respiratory:  Negative for shortness of breath.   Gastrointestinal:  Positive for heartburn. Negative for constipation.  Psychiatric/Behavioral:  The patient is not nervous/anxious.   All other systems reviewed and are negative.     Objective:   Physical Exam Vitals reviewed.  Constitutional:      General: She is not in acute distress.    Appearance: She is well-developed.  HENT:     Head: Normocephalic and atraumatic.     Right Ear: Tympanic membrane normal.     Left Ear: Tympanic membrane normal.  Eyes:     Pupils: Pupils are equal, round, and reactive to light.  Neck:     Thyroid: No thyromegaly.  Cardiovascular:     Rate and Rhythm: Normal rate and regular rhythm.     Heart sounds: Normal heart sounds. No murmur heard. Pulmonary:     Effort: Pulmonary effort is normal. No respiratory distress.     Breath sounds: Normal breath sounds. No wheezing.  Abdominal:     General: Bowel sounds are normal. There is no distension.     Palpations: Abdomen is soft.     Tenderness: There is no abdominal tenderness.  Musculoskeletal:        General: No tenderness. Normal range of motion.     Cervical back: Normal range of motion and neck supple.  Skin:    General: Skin is warm and dry.  Neurological:     Mental Status: She is alert and oriented to person, place, and time.     Cranial Nerves: No cranial nerve deficit.     Deep Tendon Reflexes: Reflexes are normal and symmetric.  Psychiatric:        Behavior: Behavior normal.  Thought Content: Thought content normal.        Judgment: Judgment normal.      BP 130/73    Pulse 71    Temp 97.6 F (36.4 C) (Temporal)    Ht 5' 2"  (1.575 m)    Wt 146 lb 6.4 oz (66.4 kg)    BMI 26.78 kg/m      Assessment & Plan:  [name] comes in today with chief complaint of Medical Management of Chronic Issues   Diagnosis and orders addressed:  1. Essential hypertension -  CMP14+EGFR - CBC with Differential/Platelet  2. Gastroesophageal reflux disease with esophagitis without hemorrhage - CMP14+EGFR - CBC with Differential/Platelet  3. Acquired hypothyroidism - CMP14+EGFR - CBC with Differential/Platelet - TSH  4. Pure hypercholesterolemia - CMP14+EGFR - CBC with Differential/Platelet   Labs pending Health Maintenance reviewed Diet and exercise encouraged  Follow up plan: 6 months   Evelina Dun, FNP

## 2021-05-05 NOTE — Patient Instructions (Signed)
Health Maintenance After Age 76 After age 76, you are at a higher risk for certain long-term diseases and infections as well as injuries from falls. Falls are a major cause of broken bones and head injuries in people who are older than age 76. Getting regular preventive care can help to keep you healthy and well. Preventive care includes getting regular testing and making lifestyle changes as recommended by your health care provider. Talk with your health care provider about: Which screenings and tests you should have. A screening is a test that checks for a disease when you have no symptoms. A diet and exercise plan that is right for you. What should I know about screenings and tests to prevent falls? Screening and testing are the best ways to find a health problem early. Early diagnosis and treatment give you the best chance of managing medical conditions that are common after age 76. Certain conditions and lifestyle choices may make you more likely to have a fall. Your health care provider may recommend: Regular vision checks. Poor vision and conditions such as cataracts can make you more likely to have a fall. If you wear glasses, make sure to get your prescription updated if your vision changes. Medicine review. Work with your health care provider to regularly review all of the medicines you are taking, including over-the-counter medicines. Ask your health care provider about any side effects that may make you more likely to have a fall. Tell your health care provider if any medicines that you take make you feel dizzy or sleepy. Strength and balance checks. Your health care provider may recommend certain tests to check your strength and balance while standing, walking, or changing positions. Foot health exam. Foot pain and numbness, as well as not wearing proper footwear, can make you more likely to have a fall. Screenings, including: Osteoporosis screening. Osteoporosis is a condition that causes  the bones to get weaker and break more easily. Blood pressure screening. Blood pressure changes and medicines to control blood pressure can make you feel dizzy. Depression screening. You may be more likely to have a fall if you have a fear of falling, feel depressed, or feel unable to do activities that you used to do. Alcohol use screening. Using too much alcohol can affect your balance and may make you more likely to have a fall. Follow these instructions at home: Lifestyle Do not drink alcohol if: Your health care provider tells you not to drink. If you drink alcohol: Limit how much you have to: 0-1 drink a day for women. 0-2 drinks a day for men. Know how much alcohol is in your drink. In the [place] one drink equals one 12 oz bottle of beer (355 mL), one 5 oz glass of wine (148 mL), or one 1 oz glass of hard liquor (44 mL). Do not use any products that contain nicotine or tobacco. These products include cigarettes, chewing tobacco, and vaping devices, such as e-cigarettes. If you need help quitting, ask your health care provider. Activity  Follow a regular exercise program to stay fit. This will help you maintain your balance. Ask your health care provider what types of exercise are appropriate for you. If you need a cane or walker, use it as recommended by your health care provider. Wear supportive shoes that have nonskid soles. Safety  Remove any tripping hazards, such as rugs, cords, and clutter. Install safety equipment such as grab bars in bathrooms and safety rails on stairs. Keep rooms and walkways   well-lit. General instructions Talk with your health care provider about your risks for falling. Tell your health care provider if: You fall. Be sure to tell your health care provider about all falls, even ones that seem minor. You feel dizzy, tiredness (fatigue), or off-balance. Take over-the-counter and prescription medicines only as told by your health care provider. These include  supplements. Eat a healthy diet and maintain a healthy weight. A healthy diet includes low-fat dairy products, low-fat (lean) meats, and fiber from whole grains, beans, and lots of fruits and vegetables. Stay current with your vaccines. Schedule regular health, dental, and eye exams. Summary Having a healthy lifestyle and getting preventive care can help to protect your health and wellness after age 76. Screening and testing are the best way to find a health problem early and help you avoid having a fall. Early diagnosis and treatment give you the best chance for managing medical conditions that are more common for people who are older than age 76. Falls are a major cause of broken bones and head injuries in people who are older than age 76. Take precautions to prevent a fall at home. Work with your health care provider to learn what changes you can make to improve your health and wellness and to prevent falls. This information is not intended to replace advice given to you by your health care provider. Make sure you discuss any questions you have with your health care provider. Document Revised: 08/08/2020 Document Reviewed: 08/08/2020 Elsevier Patient Education  2022 [employer] Inc[employer]  

## 2021-05-26 ENCOUNTER — Other Ambulatory Visit: Payer: Self-pay | Admitting: Family

## 2021-08-20 ENCOUNTER — Other Ambulatory Visit: Payer: Self-pay | Admitting: Family

## 2021-08-20 DIAGNOSIS — K2100 Gastro-esophageal reflux disease with esophagitis, without bleeding: Secondary | ICD-10-CM

## 2021-08-20 DIAGNOSIS — K449 Diaphragmatic hernia without obstruction or gangrene: Secondary | ICD-10-CM

## 2021-08-20 DIAGNOSIS — I10 Essential (primary) hypertension: Secondary | ICD-10-CM

## 2021-08-20 DIAGNOSIS — R1319 Other dysphagia: Secondary | ICD-10-CM

## 2021-10-12 ENCOUNTER — Encounter (HOSPITAL_COMMUNITY): Payer: Self-pay

## 2021-10-12 ENCOUNTER — Encounter (HOSPITAL_COMMUNITY)
Admission: RE | Admit: 2021-10-12 | Discharge: 2021-10-12 | Disposition: A | Discharge status: Home / Self Care | Payer: Medicare HMO | Source: Ambulatory Visit | Attending: Ophthalmology | Admitting: Ophthalmology

## 2021-10-17 NOTE — H&P (Signed)
Surgical History & Physical  Patient Name: Tabitha Fields DOB: January 30, 1946  Surgery: Cataract extraction with intraocular lens implant phacoemulsification; Left Eye  Surgeon: Fabio Pierce MD Surgery Date:  10-20-21 Pre-Op Date:  10-16-21  HPI: A 64 Yr. old female patient - Patient is wearing GPCL The patient is here for a cataract evaluation of both eyes, referred from Dr. Charise Killian. The patient's vision is blurry. The complaint is associated with difficulty reading small print on medicine bottles/labels, seeing captions on a tv, and driving at night due to halos/glare. This is negatively affecting the patient's quality of life and the patient is unable to function adequately in life with the current level of vision. HPI was performed by Fabio Pierce .  Medical History: Cataracts PVD OU GERD High Blood Pressure Thyroid Problems  Review of Systems Negative Allergic/Immunologic Negative Cardiovascular Negative Constitutional Negative Ear, Nose, Mouth & Throat Negative Endocrine Negative Eyes Negative Gastrointestinal Negative Genitourinary Negative Hemotologic/Lymphatic Negative Integumentary Negative Musculoskeletal Negative Neurological Negative Psychiatry Negative Respiratory  Social   Never smoked   Medication  Aspirin, Nexium, Cephalexin, Fish Oil, Levothyroxine, Multivitamin,   Sx/Procedures Tubal Ligation,   Drug Allergies   NKDA  History & Physical: Heent: Cataract, left eye NECK: supple without bruits LUNGS: lungs clear to auscultation CV: regular rate and rhythm Abdomen: soft and non-tender Impression & Plan: Assessment: 1.  COMBINED FORMS AGE RELATED CATARACT; Both Eyes (H25.813) 2.  BLEPHARITIS; Right Upper Lid, Right Lower Lid, Left Upper Lid, Left Lower Lid (H01.001, H01.002,H01.004,H01.005) 3.  DERMATOCHALASIS, no surgery; Right Upper Lid, Left Upper Lid (H02.831, H02.834) 4.  Pinguecula; Both Eyes (H11.153) 5.  ASTIGMATISM, REGULAR; Both Eyes  (H52.223)  Plan: 1.  Cataract accounts for the patient's decreased vision. This visual impairment is not correctable with a tolerable change in glasses or contact lenses. Cataract surgery with an implantation of a new lens should significantly improve the visual and functional status of the patient. Discussed all risks, benefits, alternatives, and potential complications. Discussed the procedures and recovery. Patient desires to have surgery. A-scan ordered and performed today for intra-ocular lens calculations. The surgery will be performed in order to improve vision for driving, reading, and for eye examinations. Recommend phacoemulsification with intra-ocular lens. Recommend Dextenza for post-operative pain and inflammation. Left Eye. worse - first. Dilates well - shugarcaine by protocol. Consider Vivity Lens, possible toric. Need to take IOL master after being out of contacts for 7 days. May require toric IOL.  2.  Recommend regular lid cleaning  3.  Asymptomatic, recommend observation for now. Findings, prognosis and treatment options reviewed.  4.  Observe; Artificial tears as needed for irritation.  5.  Will evaluate for toric IOL after IOL master.

## 2021-10-20 ENCOUNTER — Ambulatory Visit (HOSPITAL_COMMUNITY)
Admission: RE | Admit: 2021-10-20 | Discharge: 2021-10-20 | Disposition: A | Discharge status: Home / Self Care | Payer: Medicare HMO | Attending: Ophthalmology | Admitting: Ophthalmology

## 2021-10-20 ENCOUNTER — Encounter (HOSPITAL_COMMUNITY): Payer: Self-pay | Admitting: Ophthalmology

## 2021-10-20 ENCOUNTER — Encounter (HOSPITAL_COMMUNITY)
Admission: RE | Disposition: A | Discharge status: Home / Self Care | Payer: Self-pay | Source: Home / Self Care | Attending: Ophthalmology

## 2021-10-20 ENCOUNTER — Ambulatory Visit (HOSPITAL_BASED_OUTPATIENT_CLINIC_OR_DEPARTMENT_OTHER): Payer: Medicare HMO | Admitting: Anesthesiology

## 2021-10-20 ENCOUNTER — Ambulatory Visit (HOSPITAL_COMMUNITY): Payer: Medicare HMO | Admitting: Anesthesiology

## 2021-10-20 DIAGNOSIS — E039 Hypothyroidism, unspecified: Secondary | ICD-10-CM | POA: Insufficient documentation

## 2021-10-20 DIAGNOSIS — H0100A Unspecified blepharitis right eye, upper and lower eyelids: Secondary | ICD-10-CM | POA: Diagnosis not present

## 2021-10-20 DIAGNOSIS — H259 Unspecified age-related cataract: Secondary | ICD-10-CM

## 2021-10-20 DIAGNOSIS — H269 Unspecified cataract: Secondary | ICD-10-CM

## 2021-10-20 DIAGNOSIS — I10 Essential (primary) hypertension: Secondary | ICD-10-CM | POA: Insufficient documentation

## 2021-10-20 DIAGNOSIS — H25812 Combined forms of age-related cataract, left eye: Secondary | ICD-10-CM | POA: Diagnosis not present

## 2021-10-20 DIAGNOSIS — K449 Diaphragmatic hernia without obstruction or gangrene: Secondary | ICD-10-CM | POA: Diagnosis not present

## 2021-10-20 DIAGNOSIS — H52223 Regular astigmatism, bilateral: Secondary | ICD-10-CM | POA: Insufficient documentation

## 2021-10-20 DIAGNOSIS — H02834 Dermatochalasis of left upper eyelid: Secondary | ICD-10-CM | POA: Insufficient documentation

## 2021-10-20 DIAGNOSIS — H0100B Unspecified blepharitis left eye, upper and lower eyelids: Secondary | ICD-10-CM | POA: Diagnosis not present

## 2021-10-20 DIAGNOSIS — H25813 Combined forms of age-related cataract, bilateral: Secondary | ICD-10-CM | POA: Diagnosis present

## 2021-10-20 DIAGNOSIS — K219 Gastro-esophageal reflux disease without esophagitis: Secondary | ICD-10-CM | POA: Diagnosis not present

## 2021-10-20 DIAGNOSIS — H02831 Dermatochalasis of right upper eyelid: Secondary | ICD-10-CM | POA: Insufficient documentation

## 2021-10-20 HISTORY — PX: CATARACT EXTRACTION W/PHACO: SHX586

## 2021-10-20 SURGERY — PHACOEMULSIFICATION, CATARACT, WITH IOL INSERTION
Anesthesia: Monitor Anesthesia Care | Site: Eye | Laterality: Left

## 2021-10-20 MED ORDER — EPINEPHRINE PF 1 MG/ML IJ SOLN
INTRAOCULAR | Status: DC | PRN
  Administered 2021-10-20: 500 mL

## 2021-10-20 MED ORDER — NEOMYCIN-POLYMYXIN-DEXAMETH 3.5-10000-0.1 OP SUSP
OPHTHALMIC | Status: DC | PRN
  Administered 2021-10-20: 1 [drp] via OPHTHALMIC

## 2021-10-20 MED ORDER — BSS IO SOLN
INTRAOCULAR | Status: DC | PRN
  Administered 2021-10-20: 15 mL via INTRAOCULAR

## 2021-10-20 MED ORDER — STERILE WATER FOR IRRIGATION IR SOLN
Status: DC | PRN
  Administered 2021-10-20: 250 mL

## 2021-10-20 MED ORDER — POVIDONE-IODINE 5 % OP SOLN
OPHTHALMIC | Status: DC | PRN
  Administered 2021-10-20: 1 via OPHTHALMIC

## 2021-10-20 MED ORDER — SODIUM HYALURONATE 23MG/ML IO SOSY
PREFILLED_SYRINGE | INTRAOCULAR | Status: DC | PRN
  Administered 2021-10-20: 0.6 mL via INTRAOCULAR

## 2021-10-20 MED ORDER — LIDOCAINE HCL (PF) 1 % IJ SOLN
INTRAOCULAR | Status: DC | PRN
  Administered 2021-10-20: 1 mL via OPHTHALMIC

## 2021-10-20 MED ORDER — SODIUM HYALURONATE 10 MG/ML IO SOLUTION
PREFILLED_SYRINGE | INTRAOCULAR | Status: DC | PRN
  Administered 2021-10-20: 0.85 mL via INTRAOCULAR

## 2021-10-20 MED ORDER — MIDAZOLAM HCL 2 MG/2ML IJ SOLN
INTRAMUSCULAR | Status: AC
  Filled 2021-10-20: qty 2

## 2021-10-20 MED ORDER — EPINEPHRINE PF 1 MG/ML IJ SOLN
INTRAMUSCULAR | Status: AC
  Filled 2021-10-20: qty 2

## 2021-10-20 MED ORDER — TROPICAMIDE 1 % OP SOLN
1.0000 [drp] | OPHTHALMIC | Status: AC | PRN
  Administered 2021-10-20 (×3): 1 [drp] via OPHTHALMIC

## 2021-10-20 MED ORDER — TETRACAINE HCL 0.5 % OP SOLN
1.0000 [drp] | OPHTHALMIC | Status: AC | PRN
  Administered 2021-10-20 (×3): 1 [drp] via OPHTHALMIC

## 2021-10-20 MED ORDER — PHENYLEPHRINE HCL 2.5 % OP SOLN
1.0000 [drp] | OPHTHALMIC | Status: AC | PRN
  Administered 2021-10-20 (×3): 1 [drp] via OPHTHALMIC

## 2021-10-20 MED ORDER — LIDOCAINE HCL 3.5 % OP GEL
1.0000 | Freq: Once | OPHTHALMIC | Status: AC
  Administered 2021-10-20: 1 via OPHTHALMIC

## 2021-10-20 SURGICAL SUPPLY — 14 items
CATARACT SUITE SIGHTPATH (MISCELLANEOUS) ×2 IMPLANT
CLOTH BEACON ORANGE TIMEOUT ST (SAFETY) ×3 IMPLANT
EYE SHIELD UNIVERSAL CLEAR (GAUZE/BANDAGES/DRESSINGS) ×1 IMPLANT
FEE CATARACT SUITE SIGHTPATH (MISCELLANEOUS) ×2 IMPLANT
GLOVE BIOGEL PI IND STRL 7.0 (GLOVE) ×4 IMPLANT
GLOVE BIOGEL PI INDICATOR 7.0 (GLOVE) ×1
GLOVE SURG SS PI 7.0 STRL IVOR (GLOVE) ×1 IMPLANT
LENS IOL ACR IQ VIVITY 13.0 ×2 IMPLANT
LENS IOL ACRSF IQ VT 13.0 IMPLANT
PAD ARMBOARD 7.5X6 YLW CONV (MISCELLANEOUS) ×3 IMPLANT
SYR TB 1ML LL NO SAFETY (SYRINGE) ×3 IMPLANT
TAPE SURG TRANSPORE 1 IN (GAUZE/BANDAGES/DRESSINGS) IMPLANT
TAPE SURGICAL TRANSPORE 1 IN (GAUZE/BANDAGES/DRESSINGS) ×2
WATER STERILE IRR 250ML POUR (IV SOLUTION) ×3 IMPLANT

## 2021-10-20 NOTE — Anesthesia Postprocedure Evaluation (Signed)
Anesthesia Post Note  Patient: Tabitha Fields  Procedure(s) Performed: CATARACT EXTRACTION PHACO AND INTRAOCULAR LENS PLACEMENT (IOC) (Left: Eye)  Patient location during evaluation: Phase II Anesthesia Type: MAC Level of consciousness: awake and alert and oriented Pain management: pain level controlled Vital Signs Assessment: post-procedure vital signs reviewed and stable Respiratory status: spontaneous breathing, nonlabored ventilation and respiratory function stable Cardiovascular status: stable and blood pressure returned to baseline Postop Assessment: no apparent nausea or vomiting Anesthetic complications: no   No notable events documented.   Last Vitals:  Vitals:   10/20/21 1033 10/20/21 1127  BP: (!) 150/81 (!) 144/88  Pulse: 84 77  Resp: 20 18  Temp: 36.6 C 36.6 C  SpO2: 94% 96%    Last Pain:  Vitals:   10/20/21 1127  TempSrc: Oral  PainSc: 0-No pain                 [name] C [name]

## 2021-10-20 NOTE — Op Note (Signed)
Date of procedure: 10/20/21  Pre-operative diagnosis: Visually significant age-related combined cataract, Left Eye (H25.812)  Post-operative diagnosis: Visually significant age-related combined cataract, Left Eye (H25.812)  Procedure: Removal of cataract via phacoemulsification and insertion of intra-ocular lens Alcon Vivity DAT015 +13.0D into the capsular bag of the Left Eye  Attending surgeon: Gerda Diss. Julia Kulzer, MD, MA  Anesthesia: MAC, Topical Akten  Complications: None  Estimated Blood Loss: <53m (minimal)  Specimens: None  Implants: As above  Indications:  Visually significant age-related cataract, Left Eye  Procedure:  The patient was seen and identified in the pre-operative area. The operative eye was identified and dilated.  The operative eye was marked.  Topical anesthesia was administered to the operative eye.     The patient was then to the operative suite and placed in the supine position.  A timeout was performed confirming the patient, procedure to be performed, and all other relevant information.   The patient's face was prepped and draped in the usual fashion for intra-ocular surgery.  A lid speculum was placed into the operative eye and the surgical microscope moved into place and focused.  An inferotemporal paracentesis was created using a 20 gauge paracentesis blade.  Shugarcaine was injected into the anterior chamber.  Viscoelastic was injected into the anterior chamber.  A temporal clear-corneal main wound incision was created using a 2.447mmicrokeratome.  A continuous curvilinear capsulorrhexis was initiated using an irrigating cystitome and completed using capsulorrhexis forceps.  Hydrodissection and hydrodeliniation were performed.  Viscoelastic was injected into the anterior chamber.  A phacoemulsification handpiece and a chopper as a second instrument were used to remove the nucleus and epinucleus. The irrigation/aspiration handpiece was used to remove any remaining  cortical material.   The capsular bag was reinflated with viscoelastic, checked, and found to be intact.  The intraocular lens was inserted into the capsular bag.  The irrigation/aspiration handpiece was used to remove any remaining viscoelastic.  The clear corneal wound and paracentesis wounds were then hydrated and checked with Weck-Cels to be watertight.  Maxitrol was instilled in the eye. The lid-speculum was removed.  The drape was removed.  The patient's face was cleaned with a wet and dry 4x4.    A clear shield was taped over the eye. The patient was taken to the post-operative care unit in good condition, having tolerated the procedure well.  Post-Op Instructions: [facility] and will receive all other orders and instructions.

## 2021-10-20 NOTE — Transfer of Care (Signed)
Immediate Anesthesia Transfer of Care Note  Patient: Tabitha Fields  Procedure(s) Performed: CATARACT EXTRACTION PHACO AND INTRAOCULAR LENS PLACEMENT (IOC) (Left: Eye)  Patient Location: Short Stay  Anesthesia Type:MAC  Level of Consciousness: awake, alert  and oriented  Airway & Oxygen Therapy: Patient Spontanous Breathing  Post-op Assessment: Report given to RN and Post -op Vital signs reviewed and stable  Post vital signs: Reviewed and stable  Last Vitals:  Vitals Value Taken Time  BP    Temp    Pulse    Resp    SpO2      Last Pain:  Vitals:   10/20/21 1033  TempSrc: Oral  PainSc: 0-No pain         Complications: No notable events documented.

## 2021-10-20 NOTE — Discharge Instructions (Signed)
Please discharge patient when stable, will follow up today with Dr. Isak Sotomayor at the [facility] office immediately following discharge.  Leave shield in place until visit.  All paperwork with discharge instructions will be given at the office.  [facility] Address:  [address]  [place], [place] [address]  

## 2021-10-20 NOTE — Interval H&P Note (Signed)
History and Physical Interval Note:  10/20/2021 11:03 AM  Tabitha Fields  has presented today for surgery, with the diagnosis of combined forms age related cataract; left.  The various methods of treatment have been discussed with the patient and family. After consideration of risks, benefits and other options for treatment, the patient has consented to  Procedure(s) with comments: CATARACT EXTRACTION PHACO AND INTRAOCULAR LENS PLACEMENT (IOC) (Left) - left as a surgical intervention.  The patient's history has been reviewed, patient examined, no change in status, stable for surgery.  I have reviewed the patient's chart and labs.  Questions were answered to the patient's satisfaction.     Fabio Pierce

## 2021-10-20 NOTE — Anesthesia Preprocedure Evaluation (Signed)
Anesthesia Evaluation  Patient identified by MRN, date of birth, ID band Patient awake    Reviewed: Allergy & Precautions, NPO status , Patient's Chart, lab work & pertinent test results  Airway Mallampati: II  TM Distance: >3 FB Neck ROM: Full    Dental  (+) Dental Advisory Given, Upper Dentures   Pulmonary neg pulmonary ROS,    Pulmonary exam normal breath sounds clear to auscultation       Cardiovascular Exercise Tolerance: Good hypertension, Pt. on medications Normal cardiovascular exam Rhythm:Regular Rate:Normal     Neuro/Psych  Neuromuscular disease negative psych ROS   GI/Hepatic Neg liver ROS, hiatal hernia, GERD  Medicated and Controlled,  Endo/Other  Hypothyroidism   Renal/GU negative Renal ROS  negative genitourinary   Musculoskeletal negative musculoskeletal ROS (+)   Abdominal   Peds negative pediatric ROS (+)  Hematology negative hematology ROS (+)   Anesthesia Other Findings   Reproductive/Obstetrics negative OB ROS                            Anesthesia Physical Anesthesia Plan  ASA: 2  Anesthesia Plan: MAC   Post-op Pain Management: Minimal or no pain anticipated   Induction: Intravenous  PONV Risk Score and Plan:   Airway Management Planned: Nasal Cannula and Natural Airway  Additional Equipment:   Intra-op Plan:   Post-operative Plan:   Informed Consent: I have reviewed the patients History and Physical, chart, labs and discussed the procedure including the risks, benefits and alternatives for the proposed anesthesia with the patient or authorized representative who has indicated his/her understanding and acceptance.     Dental advisory given  Plan Discussed with: CRNA and Surgeon  Anesthesia Plan Comments:        Anesthesia Quick Evaluation

## 2021-10-20 NOTE — Anesthesia Procedure Notes (Signed)
Procedure Name: MAC Date/Time: 10/20/2021 11:10 AM  Performed by: Orlie Dakin, CRNAPre-anesthesia Checklist: Patient identified, Emergency Drugs available, Suction available and Patient being monitored Patient Re-evaluated:Patient Re-evaluated prior to induction Oxygen Delivery Method: Nasal cannula Placement Confirmation: positive ETCO2

## 2021-10-25 ENCOUNTER — Encounter (HOSPITAL_COMMUNITY): Payer: Self-pay | Admitting: Ophthalmology

## 2021-10-30 ENCOUNTER — Encounter (HOSPITAL_COMMUNITY)
Admission: RE | Admit: 2021-10-30 | Discharge: 2021-10-30 | Disposition: A | Discharge status: Home / Self Care | Payer: Medicare HMO | Source: Ambulatory Visit | Attending: Ophthalmology | Admitting: Ophthalmology

## 2021-10-30 NOTE — H&P (Signed)
Surgical History & Physical  Patient Name: Tabitha Fields DOB: Jul 13, 1945  Surgery: Cataract extraction with intraocular lens implant phacoemulsification; Right Eye  Surgeon: Fabio Pierce MD Surgery Date:  11-03-21 Pre-Op Date:  10-26-21  HPI: A 73 Yr. old female patient 1. The patient is returning after cataract surgery. The left eye is affected. Status post cataract surgery, which began 6 days ago: Since the last visit, the affected area is doing well. The patient's vision is improved. Patient is following medication instructions. 2. 2. The patient is returning for a cataract follow-up of the right eye. Since the last visit, the affected area is worsening. The patient's vision is blurry. The complaint is associated with difficulty driving at night due to glare/halos, seeing captions on tv, and reading small print on medicine bottles/labels. This is negatively affecting the patient's quality of life and the patient is unable to function adequately in life with the current level of vision. HPI was performed by Fabio Pierce .  Medical History: Cataracts PVD OU GERD High Blood Pressure Thyroid Problems  Review of Systems Negative Allergic/Immunologic Negative Cardiovascular Negative Constitutional Negative Ear, Nose, Mouth & Throat Negative Endocrine Negative Eyes Negative Gastrointestinal Negative Genitourinary Negative Hemotologic/Lymphatic Negative Integumentary Negative Musculoskeletal Negative Neurological Negative Psychiatry Negative Respiratory  Social   Never smoked   Medication Prednisolone-Moxifloxacin-Bromfenac,  Aspirin, Nexium, Cephalexin, Fish Oil, Levothyroxine, Multivitamin,   Sx/Procedures Phaco c IOL OS,  Tubal Ligation,   Drug Allergies   NKDA  History & Physical: Heent: Cataract, right eye NECK: supple without bruits LUNGS: lungs clear to auscultation CV: regular rate and rhythm Abdomen: soft and non-tender Impression &  Plan: Assessment: 1.  CATARACT EXTRACTION STATUS; Left Eye (Z98.42) 2.  COMBINED FORMS AGE RELATED CATARACT; Right Eye (H25.811) 3.  Myopia ; Right Eye (H52.11) 4.  ASTIGMATISM, REGULAR; Both Eyes (H52.223)  Plan: 1.  1 week after cataract surgery. Doing well with improved vision and normal eye pressure. Call with any problems or concerns. Continue Pred-Moxi-Brom 2x/day for 3 more weeks.  2.  Cataract accounts for the patient's decreased vision. This visual impairment is not correctable with a tolerable change in glasses or contact lenses. Cataract surgery with an implantation of a new lens should significantly improve the visual and functional status of the patient. Discussed all risks, benefits, alternatives, and potential complications. Discussed the procedures and recovery. Patient desires to have surgery. A-scan ordered and performed today for intra-ocular lens calculations. The surgery will be performed in order to improve vision for driving, reading, and for eye examinations. Recommend phacoemulsification with intra-ocular lens. Recommend Dextenza for post-operative pain and inflammation. Right Eye. Surgery required to correct imbalance of vision. Dilates well - shugarcaine by protocol. Vivity Toric.  3.   4.  Vivity Toric OD.

## 2021-11-03 ENCOUNTER — Ambulatory Visit (HOSPITAL_COMMUNITY): Payer: Medicare HMO | Admitting: Anesthesiology

## 2021-11-03 ENCOUNTER — Encounter (HOSPITAL_COMMUNITY): Payer: Self-pay | Admitting: Ophthalmology

## 2021-11-03 ENCOUNTER — Ambulatory Visit (HOSPITAL_BASED_OUTPATIENT_CLINIC_OR_DEPARTMENT_OTHER): Payer: Medicare HMO | Admitting: Anesthesiology

## 2021-11-03 ENCOUNTER — Encounter (HOSPITAL_COMMUNITY)
Admission: RE | Disposition: A | Discharge status: Home / Self Care | Payer: Self-pay | Source: Ambulatory Visit | Attending: Ophthalmology

## 2021-11-03 ENCOUNTER — Ambulatory Visit (HOSPITAL_COMMUNITY)
Admission: RE | Admit: 2021-11-03 | Discharge: 2021-11-03 | Disposition: A | Discharge status: Home / Self Care | Payer: Medicare HMO | Source: Ambulatory Visit | Attending: Ophthalmology | Admitting: Ophthalmology

## 2021-11-03 DIAGNOSIS — K449 Diaphragmatic hernia without obstruction or gangrene: Secondary | ICD-10-CM

## 2021-11-03 DIAGNOSIS — I10 Essential (primary) hypertension: Secondary | ICD-10-CM | POA: Diagnosis not present

## 2021-11-03 DIAGNOSIS — E039 Hypothyroidism, unspecified: Secondary | ICD-10-CM

## 2021-11-03 DIAGNOSIS — Z9842 Cataract extraction status, left eye: Secondary | ICD-10-CM | POA: Insufficient documentation

## 2021-11-03 DIAGNOSIS — H5211 Myopia, right eye: Secondary | ICD-10-CM | POA: Diagnosis not present

## 2021-11-03 DIAGNOSIS — H52201 Unspecified astigmatism, right eye: Secondary | ICD-10-CM | POA: Diagnosis not present

## 2021-11-03 DIAGNOSIS — G709 Myoneural disorder, unspecified: Secondary | ICD-10-CM | POA: Diagnosis not present

## 2021-11-03 DIAGNOSIS — H25811 Combined forms of age-related cataract, right eye: Secondary | ICD-10-CM

## 2021-11-03 HISTORY — PX: CATARACT EXTRACTION W/PHACO: SHX586

## 2021-11-03 SURGERY — PHACOEMULSIFICATION, CATARACT, WITH IOL INSERTION
Anesthesia: Monitor Anesthesia Care | Site: Eye | Laterality: Right

## 2021-11-03 MED ORDER — TROPICAMIDE 1 % OP SOLN
1.0000 [drp] | OPHTHALMIC | Status: AC | PRN
  Administered 2021-11-03 (×3): 1 [drp] via OPHTHALMIC

## 2021-11-03 MED ORDER — STERILE WATER FOR IRRIGATION IR SOLN
Status: DC | PRN
  Administered 2021-11-03: 250 mL

## 2021-11-03 MED ORDER — LIDOCAINE HCL 3.5 % OP GEL
1.0000 | Freq: Once | OPHTHALMIC | Status: AC
  Administered 2021-11-03: 1 via OPHTHALMIC

## 2021-11-03 MED ORDER — BSS IO SOLN
INTRAOCULAR | Status: DC | PRN
  Administered 2021-11-03: 15 mL via INTRAOCULAR

## 2021-11-03 MED ORDER — PHENYLEPHRINE HCL 2.5 % OP SOLN
1.0000 [drp] | OPHTHALMIC | Status: AC | PRN
  Administered 2021-11-03 (×3): 1 [drp] via OPHTHALMIC

## 2021-11-03 MED ORDER — EPINEPHRINE PF 1 MG/ML IJ SOLN
INTRAOCULAR | Status: DC | PRN
  Administered 2021-11-03: 500 mL

## 2021-11-03 MED ORDER — LIDOCAINE HCL (PF) 1 % IJ SOLN
INTRAOCULAR | Status: DC | PRN
  Administered 2021-11-03: 1 mL via OPHTHALMIC

## 2021-11-03 MED ORDER — NEOMYCIN-POLYMYXIN-DEXAMETH 3.5-10000-0.1 OP SUSP
OPHTHALMIC | Status: DC | PRN
  Administered 2021-11-03: 2 [drp] via OPHTHALMIC

## 2021-11-03 MED ORDER — SODIUM HYALURONATE 10 MG/ML IO SOLUTION
PREFILLED_SYRINGE | INTRAOCULAR | Status: DC | PRN
  Administered 2021-11-03: 0.85 mL via INTRAOCULAR

## 2021-11-03 MED ORDER — SODIUM HYALURONATE 23MG/ML IO SOSY
PREFILLED_SYRINGE | INTRAOCULAR | Status: DC | PRN
  Administered 2021-11-03: 0.6 mL via INTRAOCULAR

## 2021-11-03 MED ORDER — TETRACAINE HCL 0.5 % OP SOLN
1.0000 [drp] | OPHTHALMIC | Status: AC | PRN
  Administered 2021-11-03 (×3): 1 [drp] via OPHTHALMIC

## 2021-11-03 MED ORDER — EPINEPHRINE PF 1 MG/ML IJ SOLN
INTRAMUSCULAR | Status: AC
  Filled 2021-11-03: qty 2

## 2021-11-03 MED ORDER — POVIDONE-IODINE 5 % OP SOLN
OPHTHALMIC | Status: DC | PRN
  Administered 2021-11-03: 1 via OPHTHALMIC

## 2021-11-03 SURGICAL SUPPLY — 14 items
CATARACT SUITE SIGHTPATH (MISCELLANEOUS) ×2 IMPLANT
CLOTH BEACON ORANGE TIMEOUT ST (SAFETY) ×3 IMPLANT
EYE SHIELD UNIVERSAL CLEAR (GAUZE/BANDAGES/DRESSINGS) ×1 IMPLANT
FEE CATARACT SUITE SIGHTPATH (MISCELLANEOUS) ×2 IMPLANT
GLOVE BIOGEL PI IND STRL 7.0 (GLOVE) ×4 IMPLANT
GLOVE BIOGEL PI INDICATOR 7.0 (GLOVE) ×2
GLOVE SURG SS PI 7.0 STRL IVOR (GLOVE) ×2 IMPLANT
LENS IOL ACR IQ VIVITY 12.5 ×2 IMPLANT
LENS IOL ACRSF VT TRC 12.5 IMPLANT
PAD ARMBOARD 7.5X6 YLW CONV (MISCELLANEOUS) ×3 IMPLANT
SYR TB 1ML LL NO SAFETY (SYRINGE) ×3 IMPLANT
TAPE SURG TRANSPORE 1 IN (GAUZE/BANDAGES/DRESSINGS) IMPLANT
TAPE SURGICAL TRANSPORE 1 IN (GAUZE/BANDAGES/DRESSINGS) ×2
WATER STERILE IRR 250ML POUR (IV SOLUTION) ×3 IMPLANT

## 2021-11-03 NOTE — Op Note (Signed)
Date of procedure: 11/03/21  Pre-operative diagnosis: Visually significant age-related combined-form cataract, Right Eye; Visually Significant Astigmatism, Right Eye (H25.811)  Post-operative diagnosis: Visually significant age-related combined-form cataract, Right Eye; Visually Significant Astigmatism, Right Eye  Procedure: Removal of cataract via phacoemulsification and insertion of intra-ocular lens Alcon Vivity Toric DAT315 +12.5D into the capsular bag of the Right Eye  Attending surgeon: Gerda Diss. Kitt Minardi, MD, MA  Anesthesia: MAC, Topical Akten  Complications: None  Estimated Blood Loss: <59m (minimal)  Specimens: None  Implants: As above  Indications:  Visually significant age-related cataract, Right Eye; Visually Significant Astigmatism, Right Eye  Procedure:  The patient was seen and identified in the pre-operative area. The operative eye was identified and dilated.  The operative eye was marked.  Pre-operative toric markers were used to mark the eye at 0 and 180 degrees. Topical anesthesia was administered to the operative eye.     The patient was then to the operative suite and placed in the supine position.  A timeout was performed confirming the patient, procedure to be performed, and all other relevant information.   The patient's face was prepped and draped in the usual fashion for intra-ocular surgery.  A lid speculum was placed into the operative eye and the surgical microscope moved into place and focused.  A superotemporal paracentesis was created using a 20 gauge paracentesis blade.  Shugarcaine was injected into the anterior chamber.  Viscoelastic was injected into the anterior chamber.  A temporal clear-corneal main wound incision was created using a 2.468mmicrokeratome.  A continuous curvilinear capsulorrhexis was initiated using an irrigating cystitome and completed using capsulorrhexis forceps.  Hydrodissection and hydrodeliniation were performed.  Viscoelastic was  injected into the anterior chamber.  A phacoemulsification handpiece and a chopper as a second instrument were used to remove the nucleus and epinucleus. The irrigation/aspiration handpiece was used to remove any remaining cortical material.   The capsular bag was reinflated with viscoelastic, checked, and found to be intact. The intraocular lens was inserted into the capsular bag and dialed into place using a Kuglen hook to 136 degrees.  The irrigation/aspiration handpiece was used to remove any remaining viscoelastic.  The clear corneal wound and paracentesis wounds were then hydrated and checked with Weck-Cels to be watertight.  The lid-speculum and drape was removed, and the patient's face was cleaned with a wet and dry 4x4.  Maxitrol was instilled in the eye before a clear shield was taped over the eye. The patient was taken to the post-operative care unit in good condition, having tolerated the procedure well.  Post-Op Instructions: [facility] and will receive all other orders and instructions.

## 2021-11-03 NOTE — Transfer of Care (Signed)
Immediate Anesthesia Transfer of Care Note  Patient: Tabitha Fields  Procedure(s) Performed: CATARACT EXTRACTION PHACO AND INTRAOCULAR LENS PLACEMENT (IOC) (Right: Eye)  Patient Location: PACU  Anesthesia Type:MAC  Level of Consciousness: awake, alert  and oriented  Airway & Oxygen Therapy: Patient Spontanous Breathing  Post-op Assessment: Report given to RN and Post -op Vital signs reviewed and stable  Post vital signs: Reviewed and stable  Last Vitals:  Vitals Value Taken Time  BP 154/78   Temp    Pulse 68   Resp 14   SpO2 99%     Last Pain:  Vitals:   11/03/21 0830  TempSrc: Oral  PainSc: 0-No pain      Patients Stated Pain Goal: 8 (49/70/26 [id number])  Complications: No notable events documented.

## 2021-11-03 NOTE — Discharge Instructions (Addendum)
Please discharge patient when stable, will follow up today with Dr. Wrzosek at the [facility] [place] office immediately following discharge.  Leave shield in place until visit.  All paperwork with discharge instructions will be given at the office.  [facility] Address:  [address]  [place], [place] [address]  

## 2021-11-03 NOTE — Anesthesia Postprocedure Evaluation (Signed)
Anesthesia Post Note  Patient: Tabitha Fields  Procedure(s) Performed: CATARACT EXTRACTION PHACO AND INTRAOCULAR LENS PLACEMENT (IOC) (Right: Eye)  Patient location during evaluation: Phase II Anesthesia Type: MAC Level of consciousness: awake Pain management: pain level controlled Vital Signs Assessment: post-procedure vital signs reviewed and stable Respiratory status: spontaneous breathing and respiratory function stable Cardiovascular status: blood pressure returned to baseline and stable Postop Assessment: no headache and no apparent nausea or vomiting Anesthetic complications: no Comments: Late entry   No notable events documented.   Last Vitals:  Vitals:   11/03/21 0830 11/03/21 0942  BP: (!) 129/57 131/82  Pulse: 75 78  Resp: 20 (!) 26  Temp: 36.6 C 36.7 C  SpO2: 94% 98%    Last Pain:  Vitals:   11/03/21 0942  TempSrc: Oral  PainSc: 0-No pain                 Louann Sjogren

## 2021-11-03 NOTE — Interval H&P Note (Signed)
History and Physical Interval Note:  11/03/2021 9:16 AM  Tabitha Fields  has presented today for surgery, with the diagnosis of combined forms age related cataract; right.  The various methods of treatment have been discussed with the patient and family. After consideration of risks, benefits and other options for treatment, the patient has consented to  Procedure(s) with comments: CATARACT EXTRACTION PHACO AND INTRAOCULAR LENS PLACEMENT (IOC) (Right) - ODE:  as a surgical intervention.  The patient's history has been reviewed, patient examined, no change in status, stable for surgery.  I have reviewed the patient's chart and labs.  Questions were answered to the patient's satisfaction.     Fabio Pierce

## 2021-11-03 NOTE — Anesthesia Preprocedure Evaluation (Signed)
Anesthesia Evaluation  Patient identified by MRN, date of birth, ID band Patient awake    Reviewed: Allergy & Precautions, NPO status , Patient's Chart, lab work & pertinent test results  Airway Mallampati: II  TM Distance: >3 FB Neck ROM: Full    Dental  (+) Dental Advisory Given, Upper Dentures   Pulmonary neg pulmonary ROS,    Pulmonary exam normal breath sounds clear to auscultation       Cardiovascular Exercise Tolerance: Good hypertension, Pt. on medications Normal cardiovascular exam Rhythm:Regular Rate:Normal     Neuro/Psych  Neuromuscular disease negative psych ROS   GI/Hepatic Neg liver ROS, hiatal hernia, GERD  Medicated and Controlled,  Endo/Other  Hypothyroidism   Renal/GU negative Renal ROS  negative genitourinary   Musculoskeletal negative musculoskeletal ROS (+)   Abdominal   Peds negative pediatric ROS (+)  Hematology negative hematology ROS (+)   Anesthesia Other Findings   Reproductive/Obstetrics negative OB ROS                             Anesthesia Physical  Anesthesia Plan  ASA: 2  Anesthesia Plan: MAC   Post-op Pain Management: Minimal or no pain anticipated   Induction: Intravenous  PONV Risk Score and Plan:   Airway Management Planned: Nasal Cannula and Natural Airway  Additional Equipment:   Intra-op Plan:   Post-operative Plan:   Informed Consent: I have reviewed the patients History and Physical, chart, labs and discussed the procedure including the risks, benefits and alternatives for the proposed anesthesia with the patient or authorized representative who has indicated his/her understanding and acceptance.     Dental advisory given  Plan Discussed with: CRNA and Surgeon  Anesthesia Plan Comments:         Anesthesia Quick Evaluation

## 2021-11-03 NOTE — Anesthesia Procedure Notes (Signed)
Date/Time: 11/03/2021 9:19 AM  Performed by: Franco Nones, CRNAPre-anesthesia Checklist: Patient identified, Emergency Drugs available, Suction available, Timeout performed and Patient being monitored Patient Re-evaluated:Patient Re-evaluated prior to induction Oxygen Delivery Method: Nasal Cannula

## 2021-11-06 ENCOUNTER — Encounter (HOSPITAL_COMMUNITY): Payer: Self-pay | Admitting: Ophthalmology

## 2021-11-17 ENCOUNTER — Other Ambulatory Visit: Payer: Self-pay | Admitting: Family

## 2021-11-17 DIAGNOSIS — K2100 Gastro-esophageal reflux disease with esophagitis, without bleeding: Secondary | ICD-10-CM

## 2021-11-17 DIAGNOSIS — I10 Essential (primary) hypertension: Secondary | ICD-10-CM

## 2021-11-17 DIAGNOSIS — R1319 Other dysphagia: Secondary | ICD-10-CM

## 2021-11-17 DIAGNOSIS — K449 Diaphragmatic hernia without obstruction or gangrene: Secondary | ICD-10-CM

## 2021-11-28 ENCOUNTER — Encounter: Payer: Self-pay | Admitting: Family

## 2021-11-28 ENCOUNTER — Ambulatory Visit (INDEPENDENT_AMBULATORY_CARE_PROVIDER_SITE_OTHER): Payer: Medicare HMO | Admitting: Family

## 2021-11-28 VITALS — BP 140/74 | HR 60 | Temp 97.8°F | Ht 62.0 in | Wt 151.0 lb

## 2021-11-28 DIAGNOSIS — R1319 Other dysphagia: Secondary | ICD-10-CM | POA: Diagnosis not present

## 2021-11-28 DIAGNOSIS — K2100 Gastro-esophageal reflux disease with esophagitis, without bleeding: Secondary | ICD-10-CM

## 2021-11-28 DIAGNOSIS — Z0001 Encounter for general adult medical examination with abnormal findings: Secondary | ICD-10-CM

## 2021-11-28 DIAGNOSIS — K449 Diaphragmatic hernia without obstruction or gangrene: Secondary | ICD-10-CM | POA: Diagnosis not present

## 2021-11-28 DIAGNOSIS — E039 Hypothyroidism, unspecified: Secondary | ICD-10-CM

## 2021-11-28 DIAGNOSIS — M8580 Other specified disorders of bone density and structure, unspecified site: Secondary | ICD-10-CM

## 2021-11-28 DIAGNOSIS — I10 Essential (primary) hypertension: Secondary | ICD-10-CM

## 2021-11-28 DIAGNOSIS — E7800 Pure hypercholesterolemia, unspecified: Secondary | ICD-10-CM

## 2021-11-28 DIAGNOSIS — Z0000 Encounter for general adult medical examination without abnormal findings: Secondary | ICD-10-CM

## 2021-11-28 MED ORDER — SIMVASTATIN 40 MG PO TABS: ORAL_TABLET | ORAL | 3 refills | Status: DC

## 2021-11-28 MED ORDER — ESOMEPRAZOLE MAGNESIUM 40 MG PO CPDR: DELAYED_RELEASE_CAPSULE | ORAL | 3 refills | Status: DC

## 2021-11-28 MED ORDER — LEVOTHYROXINE SODIUM 100 MCG PO TABS: 100.0000 ug | ORAL_TABLET | Freq: Every day | ORAL | 1 refills | Status: DC

## 2021-11-28 MED ORDER — LISINOPRIL 10 MG PO TABS
ORAL_TABLET | ORAL | 3 refills | Status: DC
Start: 2021-11-28 — End: 2022-06-08

## 2021-11-28 NOTE — Patient Instructions (Signed)
Health Maintenance After Age 76 After age 76, you are at a higher risk for certain long-term diseases and infections as well as injuries from falls. Falls are a major cause of broken bones and head injuries in people who are older than age 76. Getting regular preventive care can help to keep you healthy and well. Preventive care includes getting regular testing and making lifestyle changes as recommended by your health care provider. Talk with your health care provider about: Which screenings and tests you should have. A screening is a test that checks for a disease when you have no symptoms. A diet and exercise plan that is right for you. What should I know about screenings and tests to prevent falls? Screening and testing are the best ways to find a health problem early. Early diagnosis and treatment give you the best chance of managing medical conditions that are common after age 76. Certain conditions and lifestyle choices may make you more likely to have a fall. Your health care provider may recommend: Regular vision checks. Poor vision and conditions such as cataracts can make you more likely to have a fall. If you wear glasses, make sure to get your prescription updated if your vision changes. Medicine review. Work with your health care provider to regularly review all of the medicines you are taking, including over-the-counter medicines. Ask your health care provider about any side effects that may make you more likely to have a fall. Tell your health care provider if any medicines that you take make you feel dizzy or sleepy. Strength and balance checks. Your health care provider may recommend certain tests to check your strength and balance while standing, walking, or changing positions. Foot health exam. Foot pain and numbness, as well as not wearing proper footwear, can make you more likely to have a fall. Screenings, including: Osteoporosis screening. Osteoporosis is a condition that causes  the bones to get weaker and break more easily. Blood pressure screening. Blood pressure changes and medicines to control blood pressure can make you feel dizzy. Depression screening. You may be more likely to have a fall if you have a fear of falling, feel depressed, or feel unable to do activities that you used to do. Alcohol use screening. Using too much alcohol can affect your balance and may make you more likely to have a fall. Follow these instructions at home: Lifestyle Do not drink alcohol if: Your health care provider tells you not to drink. If you drink alcohol: Limit how much you have to: 0-1 drink a day for women. 0-2 drinks a day for men. Know how much alcohol is in your drink. In the [place] one drink equals one 12 oz bottle of beer (355 mL), one 5 oz glass of wine (148 mL), or one 1 oz glass of hard liquor (44 mL). Do not use any products that contain nicotine or tobacco. These products include cigarettes, chewing tobacco, and vaping devices, such as e-cigarettes. If you need help quitting, ask your health care provider. Activity  Follow a regular exercise program to stay fit. This will help you maintain your balance. Ask your health care provider what types of exercise are appropriate for you. If you need a cane or walker, use it as recommended by your health care provider. Wear supportive shoes that have nonskid soles. Safety  Remove any tripping hazards, such as rugs, cords, and clutter. Install safety equipment such as grab bars in bathrooms and safety rails on stairs. Keep rooms and walkways   well-lit. General instructions Talk with your health care provider about your risks for falling. Tell your health care provider if: You fall. Be sure to tell your health care provider about all falls, even ones that seem minor. You feel dizzy, tiredness (fatigue), or off-balance. Take over-the-counter and prescription medicines only as told by your health care provider. These include  supplements. Eat a healthy diet and maintain a healthy weight. A healthy diet includes low-fat dairy products, low-fat (lean) meats, and fiber from whole grains, beans, and lots of fruits and vegetables. Stay current with your vaccines. Schedule regular health, dental, and eye exams. Summary Having a healthy lifestyle and getting preventive care can help to protect your health and wellness after age 76. Screening and testing are the best way to find a health problem early and help you avoid having a fall. Early diagnosis and treatment give you the best chance for managing medical conditions that are more common for people who are older than age 76. Falls are a major cause of broken bones and head injuries in people who are older than age 76. Take precautions to prevent a fall at home. Work with your health care provider to learn what changes you can make to improve your health and wellness and to prevent falls. This information is not intended to replace advice given to you by your health care provider. Make sure you discuss any questions you have with your health care provider. Document Revised: 08/08/2020 Document Reviewed: 08/08/2020 Elsevier Patient Education  2023 Elsevier Inc.  

## 2021-11-28 NOTE — Progress Notes (Signed)
Subjective:    Patient ID: Tabitha Fields, female    DOB: [birth year], 76 y.o.   MRN: [id number]  Chief Complaint  Patient presents with   Medical Management of Chronic Issues    Pt presents to the office today for CPE and chronic follow up. She is followed by GI every 6 months for GERD and hiatal hernia. She states these are stable as long as she eats smaller meals.   She has had bilateral cataract surgery since our last visit.   She has osteopenia and takes calcium, but does not take Vit D. Last dexa scan was 12/22/19. Hypertension This is a chronic problem. The current episode started more than 1 year ago. The problem has been resolved since onset. The problem is controlled. Pertinent negatives include no malaise/fatigue, palpitations, peripheral edema or shortness of breath. Risk factors for coronary artery disease include dyslipidemia, obesity and sedentary lifestyle. The current treatment provides moderate improvement. There is no history of heart failure. Identifiable causes of hypertension include a thyroid problem.  Gastroesophageal Reflux She complains of belching and heartburn. She reports no hoarse voice. This is a chronic problem. The current episode started more than 1 year ago. The problem occurs occasionally. Associated symptoms include fatigue. Risk factors include obesity. She has tried a PPI for the symptoms. The treatment provided moderate relief.  Thyroid Problem Presents for follow-up visit. Symptoms include fatigue. Patient reports no cold intolerance, constipation, hoarse voice or palpitations. The symptoms have been stable. Her past medical history is significant for hyperlipidemia. There is no history of heart failure.  Hyperlipidemia This is a chronic problem. The current episode started more than 1 year ago. The problem is controlled. Recent lipid tests were reviewed and are normal. Exacerbating diseases include obesity. Pertinent negatives include no shortness  of breath. Current antihyperlipidemic treatment includes statins. The current treatment provides moderate improvement of lipids. Risk factors for coronary artery disease include dyslipidemia, hypertension, a sedentary lifestyle and post-menopausal.      Review of Systems  Constitutional:  Positive for fatigue. Negative for malaise/fatigue.  HENT:  Negative for hoarse voice.   Respiratory:  Negative for shortness of breath.   Cardiovascular:  Negative for palpitations.  Gastrointestinal:  Positive for heartburn. Negative for constipation.  Endocrine: Negative for cold intolerance.  All other systems reviewed and are negative.  Family History  Problem Relation Age of Onset   Diabetes Mother    Heart disease Mother    Cancer Father        Bone   Esophageal cancer Brother 4   Heart disease Brother    Brain cancer Sister    Heart disease Brother    Esophageal cancer Brother    Colon cancer Neg Hx    Social History   Socioeconomic History   Marital status: Widowed    Spouse name: Not on file   Number of children: 2   Years of education: Not on file   Highest education level: Associate degree: academic program  Occupational History   Occupation: retired  Tobacco Use   Smoking status: Never   Smokeless tobacco: Never  Vaping Use   Vaping Use: Never used  Substance and Sexual Activity   Alcohol use: No    Alcohol/week: 0.0 standard drinks of alcohol   Drug use: No   Sexual activity: Not Currently    Birth control/protection: Post-menopausal  Other Topics Concern   Not on file  Social History Narrative   Lives alone on one level  Has friends and family nearby   Social Determinants of Health   Financial Resource Strain: Low Risk  (04/18/2021)   Overall Financial Resource Strain (CARDIA)    Difficulty of Paying Living Expenses: Not hard at all  Food Insecurity: No Food Insecurity (04/18/2021)   Hunger Vital Sign    Worried About Running Out of Food in the Last Year:  Never true    Ran Out of Food in the Last Year: Never true  Transportation Needs: No Transportation Needs (04/18/2021)   PRAPARE - Hydrologist (Medical): No    Lack of Transportation (Non-Medical): No  Physical Activity: Insufficiently Active (04/18/2021)   Exercise Vital Sign    Days of Exercise per Week: 7 days    Minutes of Exercise per Session: 20 min  Stress: No Stress Concern Present (04/18/2021)   [place]    Feeling of Stress : Not at all  Social Connections: Moderately Isolated (04/18/2021)   Social Connection and Isolation Panel [NHANES]    Frequency of Communication with Friends and Family: More than three times a week    Frequency of Social Gatherings with Friends and Family: More than three times a week    Attends Religious Services: Never    Marine scientist or Organizations: No    Attends Archivist Meetings: Never    Marital Status: Married       Objective:   Physical Exam Vitals reviewed.  Constitutional:      General: She is not in acute distress.    Appearance: She is well-developed. She is obese.  HENT:     Head: Normocephalic and atraumatic.     Right Ear: Tympanic membrane normal.     Left Ear: Tympanic membrane normal.  Eyes:     Pupils: Pupils are equal, round, and reactive to light.  Neck:     Thyroid: No thyromegaly.  Cardiovascular:     Rate and Rhythm: Normal rate and regular rhythm.     Heart sounds: Normal heart sounds. No murmur heard. Pulmonary:     Effort: Pulmonary effort is normal. No respiratory distress.     Breath sounds: Normal breath sounds. No wheezing.  Abdominal:     General: Bowel sounds are normal. There is no distension.     Palpations: Abdomen is soft.     Tenderness: There is no abdominal tenderness.  Musculoskeletal:        General: No tenderness. Normal range of motion.     Cervical back: Normal range of motion  and neck supple.  Skin:    General: Skin is warm and dry.  Neurological:     Mental Status: She is alert and oriented to person, place, and time.     Cranial Nerves: No cranial nerve deficit.     Deep Tendon Reflexes: Reflexes are normal and symmetric.  Psychiatric:        Behavior: Behavior normal.        Thought Content: Thought content normal.        Judgment: Judgment normal.       BP (!) 140/74   Pulse 60   Temp 97.8 F (36.6 C) (Temporal)   Ht _0  (1.575 m)   Wt 151 lb (68.5 kg)   SpO2 96%   BMI 27.62 kg/m      Assessment & Plan:  Tabitha Fields comes in today with chief complaint of Medical Management of Chronic  Issues   Diagnosis and orders addressed:  1. Esophageal dysphagia - esomeprazole (NEXIUM) 40 MG capsule; TAKE ONE CAPSULE BY MOUTH TWICE DAILY BEFORE meals (NEEDS TO BE SEEN BEFORE NEXT REFILL)  Dispense: 180 capsule; Refill: 3 - CMP14+EGFR - CBC with Differential/Platelet  2. Gastroesophageal reflux disease with esophagitis without hemorrhage - esomeprazole (NEXIUM) 40 MG capsule; TAKE ONE CAPSULE BY MOUTH TWICE DAILY BEFORE meals (NEEDS TO BE SEEN BEFORE NEXT REFILL)  Dispense: 180 capsule; Refill: 3 - CMP14+EGFR - CBC with Differential/Platelet  3. Hiatal hernia  - esomeprazole (NEXIUM) 40 MG capsule; TAKE ONE CAPSULE BY MOUTH TWICE DAILY BEFORE meals (NEEDS TO BE SEEN BEFORE NEXT REFILL)  Dispense: 180 capsule; Refill: 3 - CMP14+EGFR - CBC with Differential/Platelet  4. Essential hypertension - lisinopril (ZESTRIL) 10 MG tablet; TAKE 1 TABLET BY MOUTH DAILY (NEEDS TO BE SEEN BEFORE NEXT REFILL)  Dispense: 90 tablet; Refill: 3 - CMP14+EGFR - CBC with Differential/Platelet  5. Acquired hypothyroidism - levothyroxine (SYNTHROID) 100 MCG tablet; Take 1 tablet (100 mcg total) by mouth daily.  Dispense: 90 tablet; Refill: 1 - CMP14+EGFR - CBC with Differential/Platelet - TSH  6. Osteopenia, unspecified location  - CMP14+EGFR - CBC  with Differential/Platelet - VITAMIN D 25 Hydroxy (Vit-D Deficiency, Fractures)  7. Pure hypercholesterolemia - simvastatin (ZOCOR) 40 MG tablet; TAKE 1 TABLET BY MOUTH EVERY DAY (NEEDS TO BE SEEN BEFORE NEXT REFILL)  Dispense: 90 tablet; Refill: 3 - CMP14+EGFR - CBC with Differential/Platelet  8. Annual physical exam - CMP14+EGFR - CBC with Differential/Platelet - TSH - Lipid panel - VITAMIN D 25 Hydroxy (Vit-D Deficiency, Fractures)   Labs pending Health Maintenance reviewed Diet and exercise encouraged  Follow up plan: 6 months  Evelina Dun, FNP

## 2021-11-29 LAB — CBC WITH DIFFERENTIAL/PLATELET
Basophils Absolute: 0 10*3/uL (ref 0.0–0.2)
Basos: 0 %
EOS (ABSOLUTE): 0.1 10*3/uL (ref 0.0–0.4)
Eos: 1 %
Hematocrit: 40.9 % (ref 34.0–46.6)
Hemoglobin: 13.8 g/dL (ref 11.1–15.9)
Immature Grans (Abs): 0 10*3/uL (ref 0.0–0.1)
Immature Granulocytes: 0 %
Lymphocytes Absolute: 1.2 10*3/uL (ref 0.7–3.1)
Lymphs: 21 %
MCH: 30.6 pg (ref 26.6–33.0)
MCHC: 33.7 g/dL (ref 31.5–35.7)
MCV: 91 fL (ref 79–97)
Monocytes Absolute: 0.4 10*3/uL (ref 0.1–0.9)
Monocytes: 7 %
Neutrophils Absolute: 4.2 10*3/uL (ref 1.4–7.0)
Neutrophils: 71 %
Platelets: 211 10*3/uL (ref 150–450)
RBC: 4.51 x10E6/uL (ref 3.77–5.28)
RDW: 12.5 % (ref 11.7–15.4)
WBC: 5.9 10*3/uL (ref 3.4–10.8)

## 2021-11-29 LAB — CMP14+EGFR
ALT: 11 IU/L (ref 0–32)
AST: 19 IU/L (ref 0–40)
Albumin/Globulin Ratio: 1.7 (ref 1.2–2.2)
Albumin: 4.3 g/dL (ref 3.8–4.8)
Alkaline Phosphatase: 71 IU/L (ref 44–121)
BUN/Creatinine Ratio: 19 (ref 12–28)
BUN: 11 mg/dL (ref 8–27)
Bilirubin Total: 0.6 mg/dL (ref 0.0–1.2)
CO2: 22 mmol/L (ref 20–29)
Calcium: 9.6 mg/dL (ref 8.7–10.3)
Chloride: 101 mmol/L (ref 96–106)
Creatinine, Ser: 0.59 mg/dL (ref 0.57–1.00)
Globulin, Total: 2.6 g/dL (ref 1.5–4.5)
Glucose: 94 mg/dL (ref 70–99)
Potassium: 5 mmol/L (ref 3.5–5.2)
Sodium: 138 mmol/L (ref 134–144)
Total Protein: 6.9 g/dL (ref 6.0–8.5)
eGFR: 93 mL/min/{1.73_m2} (ref >59)

## 2021-11-29 LAB — LIPID PANEL
Chol/HDL Ratio: 3.6 ratio (ref 0.0–4.4)
Cholesterol, Total: 157 mg/dL (ref 100–199)
HDL: 44 mg/dL (ref >39)
LDL Chol Calc (NIH): 80 mg/dL (ref 0–99)
Triglycerides: 198 mg/dL — ABNORMAL HIGH (ref 0–149)
VLDL Cholesterol Cal: 33 mg/dL (ref 5–40)

## 2021-11-29 LAB — TSH: TSH: 1.42 u[IU]/mL (ref 0.450–4.500)

## 2021-11-29 LAB — VITAMIN D 25 HYDROXY (VIT D DEFICIENCY, FRACTURES): Vit D, 25-Hydroxy: 59.3 ng/mL (ref 30.0–100.0)

## 2022-05-21 ENCOUNTER — Ambulatory Visit (INDEPENDENT_AMBULATORY_CARE_PROVIDER_SITE_OTHER): Payer: Medicare HMO

## 2022-05-21 VITALS — Ht 62.0 in | Wt 150.0 lb

## 2022-05-21 DIAGNOSIS — Z0000 Encounter for general adult medical examination without abnormal findings: Secondary | ICD-10-CM

## 2022-05-21 DIAGNOSIS — Z780 Asymptomatic menopausal state: Secondary | ICD-10-CM | POA: Diagnosis not present

## 2022-05-21 NOTE — Patient Instructions (Signed)
Tabitha Fields , Thank you for taking time to come for your Medicare Wellness Visit. I appreciate your ongoing commitment to your health goals. Please review the following plan we discussed and let me know if I can assist you in the future.   These are the goals we discussed:  Goals      AWV     04/15/2020 AWV Goal: Fall Prevention  Over the next year, patient will decrease their risk for falls by: Using assistive devices, such as a cane or walker, as needed Identifying fall risks within their home and correcting them by: Removing throw rugs Adding handrails to stairs or ramps Removing clutter and keeping a clear pathway throughout the home Increasing light, especially at night Adding shower handles/bars Raising toilet seat Identifying potential personal risk factors for falls: Medication side effects Incontinence/urgency Vestibular dysfunction Hearing loss Musculoskeletal disorders Neurological disorders Orthostatic hypotension          This is a list of the screening recommended for you and due dates:  Health Maintenance  Topic Date Due   Zoster (Shingles) Vaccine (1 of 2) Never done   COVID-19 Vaccine (6 - 2023-24 season) 12/01/2021   DEXA scan (bone density measurement)  12/21/2021   Flu Shot  07/01/2022*   Mammogram  11/29/2022*   Medicare Annual Wellness Visit  05/22/2023   DTaP/Tdap/Td vaccine (3 - Td or Tdap) 05/06/2031   Pneumonia Vaccine  Completed   Hepatitis C Screening: USPSTF Recommendation to screen - Ages 4-79 yo.  Completed   HPV Vaccine  Aged Out   Colon Cancer Screening  Discontinued  *Topic was postponed. The date shown is not the original due date.    Advanced directives: In Chart   Conditions/risks identified: Aim for 30 minutes of exercise or brisk walking, 6-8 glasses of water, and 5 servings of fruits and vegetables each day.    Next appointment: Follow up in one year for your annual wellness visit    Preventive Care 65 Years and Older,  Female Preventive care refers to lifestyle choices and visits with your health care provider that can promote health and wellness. What does preventive care include? A yearly physical exam. This is also called an annual well check. Dental exams once or twice a year. Routine eye exams. Ask your health care provider how often you should have your eyes checked. Personal lifestyle choices, including: Daily care of your teeth and gums. Regular physical activity. Eating a healthy diet. Avoiding tobacco and drug use. Limiting alcohol use. Practicing safe sex. Taking low-dose aspirin every day. Taking vitamin and mineral supplements as recommended by your health care provider. What happens during an annual well check? The services and screenings done by your health care provider during your annual well check will depend on your age, overall health, lifestyle risk factors, and family history of disease. Counseling  Your health care provider may ask you questions about your: Alcohol use. Tobacco use. Drug use. Emotional well-being. Home and relationship well-being. Sexual activity. Eating habits. History of falls. Memory and ability to understand (cognition). Work and work Statistician. Reproductive health. Screening  You may have the following tests or measurements: Height, weight, and BMI. Blood pressure. Lipid and cholesterol levels. These may be checked every 5 years, or more frequently if you are over 29 years old. Skin check. Lung cancer screening. You may have this screening every year starting at age 29 if you have a 30-pack-year history of smoking and currently smoke or have quit within the  past 15 years. Fecal occult blood test (FOBT) of the stool. You may have this test every year starting at age 1. Flexible sigmoidoscopy or colonoscopy. You may have a sigmoidoscopy every 5 years or a colonoscopy every 10 years starting at age 39. Hepatitis C blood test. Hepatitis B blood  test. Sexually transmitted disease (STD) testing. Diabetes screening. This is done by checking your blood sugar (glucose) after you have not eaten for a while (fasting). You may have this done every 1-3 years. Bone density scan. This is done to screen for osteoporosis. You may have this done starting at age 92. Mammogram. This may be done every 1-2 years. Talk to your health care provider about how often you should have regular mammograms. Talk with your health care provider about your test results, treatment options, and if necessary, the need for more tests. Vaccines  Your health care provider may recommend certain vaccines, such as: Influenza vaccine. This is recommended every year. Tetanus, diphtheria, and acellular pertussis (Tdap, Td) vaccine. You may need a Td booster every 10 years. Zoster vaccine. You may need this after age 40. Pneumococcal 13-valent conjugate (PCV13) vaccine. One dose is recommended after age 1. Pneumococcal polysaccharide (PPSV23) vaccine. One dose is recommended after age 60. Talk to your health care provider about which screenings and vaccines you need and how often you need them. This information is not intended to replace advice given to you by your health care provider. Make sure you discuss any questions you have with your health care provider. Document Released: 04/15/2015 Document Revised: 12/07/2015 Document Reviewed: 01/18/2015 Elsevier Interactive Patient Education  2017 [place] Prevention in the Home Falls can cause injuries. They can happen to people of all ages. There are many things you can do to make your home safe and to help prevent falls. What can I do on the outside of my home? Regularly fix the edges of walkways and driveways and fix any cracks. Remove anything that might make you trip as you walk through a door, such as a raised step or threshold. Trim any bushes or trees on the path to your home. Use bright outdoor  lighting. Clear any walking paths of anything that might make someone trip, such as rocks or tools. Regularly check to see if handrails are loose or broken. Make sure that both sides of any steps have handrails. Any raised decks and porches should have guardrails on the edges. Have any leaves, snow, or ice cleared regularly. Use sand or salt on walking paths during winter. Clean up any spills in your garage right away. This includes oil or grease spills. What can I do in the bathroom? Use night lights. Install grab bars by the toilet and in the tub and shower. Do not use towel bars as grab bars. Use non-skid mats or decals in the tub or shower. If you need to sit down in the shower, use a plastic, non-slip stool. Keep the floor dry. Clean up any water that spills on the floor as soon as it happens. Remove soap buildup in the tub or shower regularly. Attach bath mats securely with double-sided non-slip rug tape. Do not have throw rugs and other things on the floor that can make you trip. What can I do in the bedroom? Use night lights. Make sure that you have a light by your bed that is easy to reach. Do not use any sheets or blankets that are too big for your bed. They should not  hang down onto the floor. Have a firm chair that has side arms. You can use this for support while you get dressed. Do not have throw rugs and other things on the floor that can make you trip. What can I do in the kitchen? Clean up any spills right away. Avoid walking on wet floors. Keep items that you use a lot in easy-to-reach places. If you need to reach something above you, use a strong step stool that has a grab bar. Keep electrical cords out of the way. Do not use floor polish or wax that makes floors slippery. If you must use wax, use non-skid floor wax. Do not have throw rugs and other things on the floor that can make you trip. What can I do with my stairs? Do not leave any items on the stairs. Make  sure that there are handrails on both sides of the stairs and use them. Fix handrails that are broken or loose. Make sure that handrails are as long as the stairways. Check any carpeting to make sure that it is firmly attached to the stairs. Fix any carpet that is loose or worn. Avoid having throw rugs at the top or bottom of the stairs. If you do have throw rugs, attach them to the floor with carpet tape. Make sure that you have a light switch at the top of the stairs and the bottom of the stairs. If you do not have them, ask someone to add them for you. What else can I do to help prevent falls? Wear shoes that: Do not have high heels. Have rubber bottoms. Are comfortable and fit you well. Are closed at the toe. Do not wear sandals. If you use a stepladder: Make sure that it is fully opened. Do not climb a closed stepladder. Make sure that both sides of the stepladder are locked into place. Ask someone to hold it for you, if possible. Clearly mark and make sure that you can see: Any grab bars or handrails. First and last steps. Where the edge of each step is. Use tools that help you move around (mobility aids) if they are needed. These include: Canes. Walkers. Scooters. Crutches. Turn on the lights when you go into a dark area. Replace any light bulbs as soon as they burn out. Set up your furniture so you have a clear path. Avoid moving your furniture around. If any of your floors are uneven, fix them. If there are any pets around you, be aware of where they are. Review your medicines with your doctor. Some medicines can make you feel dizzy. This can increase your chance of falling. Ask your doctor what other things that you can do to help prevent falls. This information is not intended to replace advice given to you by your health care provider. Make sure you discuss any questions you have with your health care provider. Document Released: 01/13/2009 Document Revised: 08/25/2015  Document Reviewed: 04/23/2014 Elsevier Interactive Patient Education  2017 [employer].

## 2022-05-21 NOTE — Progress Notes (Signed)
Subjective:   Tabitha Fields is a 77 y.o. female who presents for Medicare Annual (Subsequent) preventive examination. I connected with  Raiyne Karwacki Coggins on [phone] by a audio enabled telemedicine application and verified that I am speaking with the correct person using two identifiers.  Patient Location: Home  Provider Location: Home Office  I discussed the limitations of evaluation and management by telemedicine. The patient expressed understanding and agreed to proceed.  Review of Systems     Cardiac Risk Factors include: advanced age (>52mn, >>32women);hypertension;dyslipidemia     Objective:    Today's Vitals   05/21/22 1021  Weight: 150 lb (68 kg)  Height: 5' 2"$  (1.575 m)   Body mass index is 27.44 kg/m.     05/21/2022   10:24 AM 11/03/2021    8:28 AM 10/30/2021    3:30 PM 10/12/2021    9:45 AM 04/18/2021    2:35 PM 04/15/2020    9:20 AM 06/03/2017    1:15 PM  Advanced Directives  Does Patient Have a Medical Advance Directive? Yes Yes No Yes Yes Yes No  Type of AParamedicof AWinter GardenLiving will  Healthcare Power of [place] RockLiving will [place]threnLiving will [place]Living will   Does patient want to make changes to medical advance directive? No - Patient declined  No - Patient declined No - Patient declined  No - Patient declined   Copy of HDe [place] Chart? Yes - validated most recent copy scanned in chart (See row information)  No - copy requested No - copy requested No - copy requested No - copy requested   Would patient like information on creating a medical advance directive?   No - Patient declined No - Patient declined  No - Patient declined No - Patient declined    Current Medications (verified) Outpatient Encounter Medications as of 05/21/2022  Medication Sig   Ascorbic Acid (VITAMIN C PO) Take 1 tablet by mouth daily.   aspirin EC 81 MG  tablet Take 81 mg by mouth daily.   calcium carbonate 1250 MG capsule Take 1,250 mg by mouth daily.    Coenzyme Q10 (CO Q 10) 100 MG CAPS Take 200 mg by mouth daily.   esomeprazole (NEXIUM) 40 MG capsule TAKE ONE CAPSULE BY MOUTH TWICE DAILY BEFORE meals (NEEDS TO BE SEEN BEFORE NEXT REFILL)   levothyroxine (SYNTHROID) 100 MCG tablet Take 1 tablet (100 mcg total) by mouth daily.   lisinopril (ZESTRIL) 10 MG tablet TAKE 1 TABLET BY MOUTH DAILY (NEEDS TO BE SEEN BEFORE NEXT REFILL)   Misc Natural Products (OSTEO BI-FLEX JOINT SHIELD PO) Take by mouth.   Multiple Vitamin (MULTIVITAMIN) capsule Take 1 capsule by mouth daily.   Omega-3 Fatty Acids (FISH OIL) 1200 MG CAPS Take 2 capsules by mouth daily.    simvastatin (ZOCOR) 40 MG tablet TAKE 1 TABLET BY MOUTH EVERY DAY (NEEDS TO BE SEEN BEFORE NEXT REFILL)   Wheat Dextrin (BENEFIBER PO) Take by mouth daily.   No facility-administered encounter medications on file as of 05/21/2022.    Allergies (verified) Patient has no known allergies.   History: Past Medical History:  Diagnosis Date   GERD (gastroesophageal reflux disease)    Hypercholesteremia    Hypertension    Hypothyroidism    Thyroid disease    Past Surgical History:  Procedure Laterality Date   CATARACT EXTRACTION W/PHACO Left 10/20/2021   Procedure: CATARACT EXTRACTION PHACO  AND INTRAOCULAR LENS PLACEMENT (IOC);  Surgeon: Baruch Goldmann, MD;  Location: [facility];  Service: Ophthalmology;  Laterality: Left;  CDE 9.12   CATARACT EXTRACTION W/PHACO Right 11/03/2021   Procedure: CATARACT EXTRACTION PHACO AND INTRAOCULAR LENS PLACEMENT (IOC);  Surgeon: Baruch Goldmann, MD;  Location: [facility];  Service: Ophthalmology;  Laterality: Right;  CDE: 6.72   COLONOSCOPY  07/28/04   Dr. Milana Huntsman   COLONOSCOPY  2011   Dr. Gala Romney: anal canal hemorhoids, pancolonic diverticula, otherwise normal   COLONOSCOPY N/A 03/17/2015   DR. Rourk: diverticulosis. consider one last tcs in 5 years for prior h/o  tubular adenoma.   ESOPHAGOGASTRODUODENOSCOPY (EGD) WITH PROPOFOL N/A 06/06/2017   Dr. Gala Romney: Slightly baggy, atonic tubular esophagus which otherwise appeared normal, widely patent.  Dilated for history of dysphagia.  Large hiatal hernia.   HEMORROIDECTOMY     MALONEY DILATION N/A 06/06/2017   Procedure: Venia Minks DILATION;  Surgeon: Daneil Dolin, MD;  Location: [facility];  Service: Endoscopy;  Laterality: N/A;   TUBAL LIGATION     Family History  Problem Relation Age of Onset   Diabetes Mother    Heart disease Mother    Cancer Father        Bone   Esophageal cancer Brother 105   Heart disease Brother    Brain cancer Sister    Heart disease Brother    Esophageal cancer Brother    Colon cancer Neg Hx    Social History   Socioeconomic History   Marital status: Widowed    Spouse name: Not on file   Number of children: 2   Years of education: Not on file   Highest education level: Associate degree: academic program  Occupational History   Occupation: retired  Tobacco Use   Smoking status: Never   Smokeless tobacco: Never  Vaping Use   Vaping Use: Never used  Substance and Sexual Activity   Alcohol use: No    Alcohol/week: 0.0 standard drinks of alcohol   Drug use: No   Sexual activity: Not Currently    Birth control/protection: Post-menopausal  Other Topics Concern   Not on file  Social History Narrative   Lives alone on one level   Has friends and family nearby   Social Determinants of Health   Financial Resource Strain: [place]  (05/21/2022)   Overall Financial Resource Strain (CARDIA)    Difficulty of Paying Living Expenses: Not hard at all  Food Insecurity: No Food Insecurity (05/21/2022)   Hunger Vital Sign    Worried About Running Out of Food in the Last Year: Never true    [place] in the Last Year: Never true  Transportation Needs: No Transportation Needs (05/21/2022)   PRAPARE - Hydrologist (Medical): No    Lack of  Transportation (Non-Medical): No  Physical Activity: Insufficiently Active (05/21/2022)   Exercise Vital Sign    Days of Exercise per Week: 3 days    Minutes of Exercise per Session: 30 min  Stress: No Stress Concern Present (05/21/2022)   [place]    Feeling of Stress : Not at all  Social Connections: Socially Isolated (05/21/2022)   Social Connection and Isolation Panel [NHANES]    Frequency of Communication with Friends and Family: More than three times a week    Frequency of Social Gatherings with Friends and Family: More than three times a week    Attends Religious Services:  Never    Active Member of Clubs or Organizations: No    Attends Archivist Meetings: Never    Marital Status: Widowed    Tobacco Counseling Counseling given: Not Answered   Clinical Intake:  Pre-visit preparation completed: Yes  Pain : No/denies pain     Nutritional Risks: None Diabetes: No  How often do you need to have someone help you when you read instructions, pamphlets, or other written materials from your doctor or pharmacy?: 1 - Never  Diabetic?no   Interpreter Needed?: No  Information entered by :: Jadene Pierini, LPN   Activities of Daily Living    05/21/2022   10:24 AM 10/30/2021    3:31 PM  In your present state of health, do you have any difficulty performing the following activities:  Hearing? 0   Vision? 0   Difficulty concentrating or making decisions? 0   Walking or climbing stairs? 0   Dressing or bathing? 0   Doing errands, shopping? 0 0  Preparing Food and eating ? N   Using the Toilet? N   In the past six months, have you accidently leaked urine? N   Do you have problems with loss of bowel control? N   Managing your Medications? N   Managing your Finances? N   Housekeeping or managing your Housekeeping? N     Patient Care Team: Sharion Balloon, FNP as PCP - General (Family  Medicine) Gala Romney Cristopher Estimable, MD as Consulting Physician (Gastroenterology) Leonie Man, MD as Consulting Physician (Cardiology) Harlen Labs, MD as Referring Physician (Optometry)  Indicate any recent Medical Services you may have received from other than [facility] providers in the past year (date may be approximate).     Assessment:   This is a routine wellness examination for [facility].  Hearing/Vision screen Vision Screening - Comments:: Wears rx glasses - up to date with routine eye exams with  Dr.Cotter   Dietary issues and exercise activities discussed: Current Exercise Habits: Home exercise routine, Type of exercise: walking, Time (Minutes): 30, Frequency (Times/Week): 3, Weekly Exercise (Minutes/Week): 90, Intensity: Mild, Exercise limited by: None identified   Goals Addressed             This Visit's Progress    AWV   On track    04/15/2020 AWV Goal: Fall Prevention  Over the next year, patient will decrease their risk for falls by: Using assistive devices, such as a cane or walker, as needed Identifying fall risks within their home and correcting them by: Removing throw rugs Adding handrails to stairs or ramps Removing clutter and keeping a clear pathway throughout the home Increasing light, especially at night Adding shower handles/bars Raising toilet seat Identifying potential personal risk factors for falls: Medication side effects Incontinence/urgency Vestibular dysfunction Hearing loss Musculoskeletal disorders Neurological disorders Orthostatic hypotension         Depression Screen    05/21/2022   10:23 AM 11/28/2021   11:03 AM 05/05/2021    9:36 AM 04/18/2021    2:33 PM 11/10/2020   11:28 AM 09/20/2020   10:55 AM 09/14/2020    8:27 AM  PHQ 2/9 Scores  PHQ - 2 Score 0 0 0 0 0 0 0  PHQ- 9 Score     0 0 0    Fall Risk    05/21/2022   10:22 AM 05/05/2021    9:36 AM 04/18/2021    2:38 PM 11/10/2020   11:29 AM 09/20/2020   10:55 AM  Fall Risk    Falls in the past year? 0 0 0 0 0  Number falls in past yr: 0  0    Injury with Fall? 0  0    Risk for fall due to : No Fall Risks  No Fall Risks No Fall Risks   Follow up Falls prevention discussed  Falls prevention discussed Education provided     [place]:  Any stairs in or around the home? No  If so, are there any without handrails? No  Home free of loose throw rugs in walkways, pet beds, electrical cords, etc? Yes  Adequate lighting in your home to reduce risk of falls? Yes   ASSISTIVE DEVICES UTILIZED TO PREVENT FALLS:  Life alert? No  Use of a cane, walker or w/c? No  Grab bars in the bathroom? Yes  Shower chair or bench in shower? Yes  Elevated toilet seat or a handicapped toilet? Yes          05/21/2022   10:25 AM 04/15/2020    9:21 AM  6CIT Screen  What Year? 0 points 0 points  What month? 0 points 0 points  What time? 0 points 0 points  Count back from 20 0 points 0 points  Months in reverse 0 points 0 points  Repeat phrase 0 points 0 points  Total Score 0 points 0 points    Immunizations Immunization History  Administered Date(s) Administered   Influenza, High Dose Seasonal PF 12/19/2016, 12/25/2018   Influenza,inj,quad, With Preservative 12/20/2017   Influenza-Unspecified 12/28/2019, 01/11/2021   Moderna Sars-Covid-2 Vaccination 04/23/2019, 05/29/2019, 02/01/2020, 11/14/2020, 02/14/2021   Pneumococcal Conjugate-13 03/28/2020   Pneumococcal Polysaccharide-23 07/14/2012   Tdap 03/29/2011, 05/05/2021   Zoster, Live 10/02/2011    TDAP status: Up to date  Flu Vaccine status: Up to date  Pneumococcal vaccine status: Up to date  Covid-19 vaccine status: Completed vaccines  Qualifies for Shingles Vaccine? Yes   Zostavax completed No   Shingrix Completed?: No.    Education has been provided regarding the importance of this vaccine. Patient has been advised to call insurance company to determine out of pocket expense  if they have not yet received this vaccine. Advised may also receive vaccine at local pharmacy or Health Dept. Verbalized acceptance and understanding.  Screening Tests Health Maintenance  Topic Date Due   Zoster Vaccines- Shingrix (1 of 2) Never done   COVID-19 Vaccine (6 - 2023-24 season) 12/01/2021   DEXA SCAN  12/21/2021   INFLUENZA VACCINE  07/01/2022 (Originally 10/31/2021)   MAMMOGRAM  11/29/2022 (Originally 10/07/2021)   Medicare Annual Wellness (AWV)  05/22/2023   DTaP/Tdap/Td (3 - Td or Tdap) 05/06/2031   Pneumonia Vaccine 74+ Years old  Completed   Hepatitis C Screening  Completed   HPV VACCINES  Aged Out   COLONOSCOPY (Pts 45-68yr Insurance coverage will need to be confirmed)  Discontinued    Health Maintenance  Health Maintenance Due  Topic Date Due   Zoster Vaccines- Shingrix (1 of 2) Never done   COVID-19 Vaccine (6 - 2023-24 season) 12/01/2021   DEXA SCAN  12/21/2021    Colorectal cancer screening: No longer required.   Mammogram status: No longer required due to age.  Bone Density status: Ordered 05/21/2022. Pt provided with contact info and advised to call to schedule appt.  Lung Cancer Screening: (Low Dose CT Chest recommended if Age 77-80years, 30 pack-year currently smoking OR have quit w/in 15years.) does not qualify.  Lung Cancer Screening Referral: n/a  Additional Screening:  Hepatitis C Screening: does not qualify; Completed 08/27/2019  Vision Screening: Recommended annual ophthalmology exams for early detection of glaucoma and other disorders of the eye. Is the patient up to date with their annual eye exam?  Yes  Who is the provider or what is the name of the office in which the patient attends annual eye exams? Dr.Cotter  If pt is not established with a provider, would they like to be referred to a provider to establish care? No .   Dental Screening: Recommended annual dental exams for proper oral hygiene  Community Resource Referral / Chronic  Care Management: CRR required this visit?  No   CCM required this visit?  No      Plan:     I have personally reviewed and noted the following in the patient's chart:   Medical and social history Use of alcohol, tobacco or illicit drugs  Current medications and supplements including opioid prescriptions. Patient is not currently taking opioid prescriptions. Functional ability and status Nutritional status Physical activity Advanced directives List of other physicians Hospitalizations, surgeries, and ER visits in previous 12 months Vitals Screenings to include cognitive, depression, and falls Referrals and appointments  In addition, I have reviewed and discussed with patient certain preventive protocols, quality metrics, and best practice recommendations. A written personalized care plan for preventive services as well as general preventive health recommendations were provided to patient.     Daphane Shepherd, LPN   [id number]   Nurse Notes: Will Vaccines sent from [facility]

## 2022-06-08 ENCOUNTER — Ambulatory Visit (INDEPENDENT_AMBULATORY_CARE_PROVIDER_SITE_OTHER): Payer: Medicare HMO | Admitting: Family

## 2022-06-08 ENCOUNTER — Encounter: Payer: Self-pay | Admitting: Family

## 2022-06-08 ENCOUNTER — Other Ambulatory Visit: Payer: Self-pay | Admitting: Family

## 2022-06-08 ENCOUNTER — Ambulatory Visit (INDEPENDENT_AMBULATORY_CARE_PROVIDER_SITE_OTHER): Payer: Medicare HMO

## 2022-06-08 VITALS — BP 127/80 | HR 80 | Temp 97.8°F | Ht 62.0 in | Wt 152.0 lb

## 2022-06-08 DIAGNOSIS — Z780 Asymptomatic menopausal state: Secondary | ICD-10-CM

## 2022-06-08 DIAGNOSIS — M8580 Other specified disorders of bone density and structure, unspecified site: Secondary | ICD-10-CM

## 2022-06-08 DIAGNOSIS — K449 Diaphragmatic hernia without obstruction or gangrene: Secondary | ICD-10-CM

## 2022-06-08 DIAGNOSIS — K2100 Gastro-esophageal reflux disease with esophagitis, without bleeding: Secondary | ICD-10-CM

## 2022-06-08 DIAGNOSIS — I10 Essential (primary) hypertension: Secondary | ICD-10-CM

## 2022-06-08 DIAGNOSIS — R1319 Other dysphagia: Secondary | ICD-10-CM

## 2022-06-08 DIAGNOSIS — E7800 Pure hypercholesterolemia, unspecified: Secondary | ICD-10-CM

## 2022-06-08 DIAGNOSIS — E039 Hypothyroidism, unspecified: Secondary | ICD-10-CM | POA: Diagnosis not present

## 2022-06-08 DIAGNOSIS — H6123 Impacted cerumen, bilateral: Secondary | ICD-10-CM

## 2022-06-08 MED ORDER — SIMVASTATIN 40 MG PO TABS: ORAL_TABLET | ORAL | 3 refills | Status: AC

## 2022-06-08 MED ORDER — ESOMEPRAZOLE MAGNESIUM 40 MG PO CPDR: DELAYED_RELEASE_CAPSULE | ORAL | 3 refills | Status: DC

## 2022-06-08 MED ORDER — LISINOPRIL 10 MG PO TABS: ORAL_TABLET | ORAL | 3 refills | Status: AC

## 2022-06-08 NOTE — Patient Instructions (Signed)
Health Maintenance After Age 77 After age 77, you are at a higher risk for certain long-term diseases and infections as well as injuries from falls. Falls are a major cause of broken bones and head injuries in people who are older than age 77. Getting regular preventive care can help to keep you healthy and well. Preventive care includes getting regular testing and making lifestyle changes as recommended by your health care provider. Talk with your health care provider about: Which screenings and tests you should have. A screening is a test that checks for a disease when you have no symptoms. A diet and exercise plan that is right for you. What should I know about screenings and tests to prevent falls? Screening and testing are the best ways to find a health problem early. Early diagnosis and treatment give you the best chance of managing medical conditions that are common after age 77. Certain conditions and lifestyle choices may make you more likely to have a fall. Your health care provider may recommend: Regular vision checks. Poor vision and conditions such as cataracts can make you more likely to have a fall. If you wear glasses, make sure to get your prescription updated if your vision changes. Medicine review. Work with your health care provider to regularly review all of the medicines you are taking, including over-the-counter medicines. Ask your health care provider about any side effects that may make you more likely to have a fall. Tell your health care provider if any medicines that you take make you feel dizzy or sleepy. Strength and balance checks. Your health care provider may recommend certain tests to check your strength and balance while standing, walking, or changing positions. Foot health exam. Foot pain and numbness, as well as not wearing proper footwear, can make you more likely to have a fall. Screenings, including: Osteoporosis screening. Osteoporosis is a condition that causes  the bones to get weaker and break more easily. Blood pressure screening. Blood pressure changes and medicines to control blood pressure can make you feel dizzy. Depression screening. You may be more likely to have a fall if you have a fear of falling, feel depressed, or feel unable to do activities that you used to do. Alcohol use screening. Using too much alcohol can affect your balance and may make you more likely to have a fall. Follow these instructions at home: Lifestyle Do not drink alcohol if: Your health care provider tells you not to drink. If you drink alcohol: Limit how much you have to: 0-1 drink a day for women. 0-2 drinks a day for men. Know how much alcohol is in your drink. In the [place] one drink equals one 12 oz bottle of beer (355 mL), one 5 oz glass of wine (148 mL), or one 1 oz glass of hard liquor (44 mL). Do not use any products that contain nicotine or tobacco. These products include cigarettes, chewing tobacco, and vaping devices, such as e-cigarettes. If you need help quitting, ask your health care provider. Activity  Follow a regular exercise program to stay fit. This will help you maintain your balance. Ask your health care provider what types of exercise are appropriate for you. If you need a cane or walker, use it as recommended by your health care provider. Wear supportive shoes that have nonskid soles. Safety  Remove any tripping hazards, such as rugs, cords, and clutter. Install safety equipment such as grab bars in bathrooms and safety rails on stairs. Keep rooms and walkways   well-lit. General instructions Talk with your health care provider about your risks for falling. Tell your health care provider if: You fall. Be sure to tell your health care provider about all falls, even ones that seem minor. You feel dizzy, tiredness (fatigue), or off-balance. Take over-the-counter and prescription medicines only as told by your health care provider. These include  supplements. Eat a healthy diet and maintain a healthy weight. A healthy diet includes low-fat dairy products, low-fat (lean) meats, and fiber from whole grains, beans, and lots of fruits and vegetables. Stay current with your vaccines. Schedule regular health, dental, and eye exams. Summary Having a healthy lifestyle and getting preventive care can help to protect your health and wellness after age 77. Screening and testing are the best way to find a health problem early and help you avoid having a fall. Early diagnosis and treatment give you the best chance for managing medical conditions that are more common for people who are older than age 77. Falls are a major cause of broken bones and head injuries in people who are older than age 77. Take precautions to prevent a fall at home. Work with your health care provider to learn what changes you can make to improve your health and wellness and to prevent falls. This information is not intended to replace advice given to you by your health care provider. Make sure you discuss any questions you have with your health care provider. Document Revised: 08/08/2020 Document Reviewed: 08/08/2020 Elsevier Patient Education  2023 Elsevier Inc.  

## 2022-06-08 NOTE — Progress Notes (Signed)
Subjective:    Patient ID: Tabitha Fields, female    DOB: 03-02-1946, 77 y.o.   MRN: [id number]  Chief Complaint  Patient presents with   Medical Management of Chronic Issues   Pt presents to the office today for  chronic follow up. She is followed by GI every 6 months for GERD and hiatal hernia. She states these are stable as long as she eats smaller meals.   She has osteopenia and takes calcium, but does not take Vit D. Last dexa scan was 12/22/19. Will get today.  Hypertension This is a chronic problem. The current episode started more than 1 year ago. The problem has been resolved since onset. The problem is controlled. Pertinent negatives include no malaise/fatigue, peripheral edema or shortness of breath. Risk factors for coronary artery disease include dyslipidemia, obesity and sedentary lifestyle. The current treatment provides moderate improvement. Identifiable causes of hypertension include a thyroid problem.  Gastroesophageal Reflux She complains of belching and heartburn. This is a chronic problem. The current episode started more than 1 year ago. The problem occurs occasionally. Pertinent negatives include no fatigue. Risk factors include obesity. She has tried a PPI for the symptoms. The treatment provided moderate relief.  Thyroid Problem Presents for follow-up visit. Patient reports no anxiety, constipation, depressed mood, fatigue or hair loss. The symptoms have been stable. Her past medical history is significant for hyperlipidemia.  Hyperlipidemia This is a chronic problem. The current episode started more than 1 year ago. The problem is controlled. Recent lipid tests were reviewed and are normal. Exacerbating diseases include obesity. Pertinent negatives include no shortness of breath. Current antihyperlipidemic treatment includes statins. The current treatment provides moderate improvement of lipids. Risk factors for coronary artery disease include dyslipidemia,  hypertension, a sedentary lifestyle and post-menopausal.      Review of Systems  Constitutional:  Negative for fatigue and malaise/fatigue.  Respiratory:  Negative for shortness of breath.   Gastrointestinal:  Positive for heartburn. Negative for constipation.  Psychiatric/Behavioral:  The patient is not nervous/anxious.   All other systems reviewed and are negative.      Objective:   Physical Exam Vitals reviewed.  Constitutional:      General: She is not in acute distress.    Appearance: She is well-developed. She is obese.  HENT:     Head: Normocephalic and atraumatic.     Right Ear: There is impacted cerumen.     Left Ear: There is impacted cerumen.  Eyes:     Pupils: Pupils are equal, round, and reactive to light.  Neck:     Thyroid: No thyromegaly.  Cardiovascular:     Rate and Rhythm: Normal rate and regular rhythm.     Heart sounds: Normal heart sounds. No murmur heard. Pulmonary:     Effort: Pulmonary effort is normal. No respiratory distress.     Breath sounds: Normal breath sounds. No wheezing.  Abdominal:     General: Bowel sounds are normal. There is no distension.     Palpations: Abdomen is soft.     Tenderness: There is no abdominal tenderness.  Musculoskeletal:        General: No tenderness. Normal range of motion.     Cervical back: Normal range of motion and neck supple.  Skin:    General: Skin is warm and dry.  Neurological:     Mental Status: She is alert and oriented to person, place, and time.     Cranial Nerves: No cranial nerve deficit.  Deep Tendon Reflexes: Reflexes are normal and symmetric.  Psychiatric:        Behavior: Behavior normal.        Thought Content: Thought content normal.        Judgment: Judgment normal.      Bilateral ears washed in warm water and peroxide, pt tolerated well.    BP 127/80   Pulse 80   Temp 97.8 F (36.6 C) (Temporal)   Ht '5\' 2"'$  (1.575 m)   Wt 152 lb (68.9 kg)   SpO2 94%   BMI 27.80 kg/m       Assessment & Plan:  Tabitha Fields comes in today with chief complaint of Medical Management of Chronic Issues   Diagnosis and orders addressed:  1. Acquired hypothyroidism - CMP14+EGFR - CBC with Differential/Platelet - TSH  2. Pure hypercholesterolemia - simvastatin (ZOCOR) 40 MG tablet; TAKE 1 TABLET BY MOUTH EVERY DAY  Dispense: 90 tablet; Refill: 3 - CMP14+EGFR - CBC with Differential/Platelet  3. Osteopenia, unspecified location - CMP14+EGFR - CBC with Differential/Platelet  4. Gastroesophageal reflux disease with esophagitis without hemorrhage - esomeprazole (NEXIUM) 40 MG capsule; TAKE ONE CAPSULE BY MOUTH TWICE DAILY BEFORE meals  Dispense: 180 capsule; Refill: 3 - CMP14+EGFR - CBC with Differential/Platelet  5. Essential hypertension - lisinopril (ZESTRIL) 10 MG tablet; TAKE 1 TABLET BY MOUTH DAILY  Dispense: 90 tablet; Refill: 3 - CMP14+EGFR - CBC with Differential/Platelet  6. Hiatal hernia - esomeprazole (NEXIUM) 40 MG capsule; TAKE ONE CAPSULE BY MOUTH TWICE DAILY BEFORE meals  Dispense: 180 capsule; Refill: 3 - CMP14+EGFR - CBC with Differential/Platelet  7. Esophageal dysphagia - esomeprazole (NEXIUM) 40 MG capsule; TAKE ONE CAPSULE BY MOUTH TWICE DAILY BEFORE meals  Dispense: 180 capsule; Refill: 3 - CMP14+EGFR - CBC with Differential/Platelet  8. Gastroesophageal reflux disease with esophagitis without hemorrhage - esomeprazole (NEXIUM) 40 MG capsule; TAKE ONE CAPSULE BY MOUTH TWICE DAILY BEFORE meals  Dispense: 180 capsule; Refill: 3 - CMP14+EGFR - CBC with Differential/Platelet  9. Bilateral impacted cerumen Resolved   Labs pending Health Maintenance reviewed Diet and exercise encouraged  Follow up plan: 6 months    Evelina Dun, FNP

## 2022-06-09 LAB — CMP14+EGFR
ALT: 15 IU/L (ref 0–32)
AST: 24 IU/L (ref 0–40)
Albumin/Globulin Ratio: 2 (ref 1.2–2.2)
Albumin: 4.7 g/dL (ref 3.8–4.8)
Alkaline Phosphatase: 84 IU/L (ref 44–121)
BUN/Creatinine Ratio: 20 (ref 12–28)
BUN: 13 mg/dL (ref 8–27)
Bilirubin Total: 0.8 mg/dL (ref 0.0–1.2)
CO2: 21 mmol/L (ref 20–29)
Calcium: 9.6 mg/dL (ref 8.7–10.3)
Chloride: 99 mmol/L (ref 96–106)
Creatinine, Ser: 0.65 mg/dL (ref 0.57–1.00)
Globulin, Total: 2.4 g/dL (ref 1.5–4.5)
Glucose: 94 mg/dL (ref 70–99)
Potassium: 4.6 mmol/L (ref 3.5–5.2)
Sodium: 137 mmol/L (ref 134–144)
Total Protein: 7.1 g/dL (ref 6.0–8.5)
eGFR: 91 mL/min/{1.73_m2} (ref >59)

## 2022-06-09 LAB — CBC WITH DIFFERENTIAL/PLATELET
Basophils Absolute: 0 10*3/uL (ref 0.0–0.2)
Basos: 1 %
EOS (ABSOLUTE): 0.1 10*3/uL (ref 0.0–0.4)
Eos: 1 %
Hematocrit: 43.9 % (ref 34.0–46.6)
Hemoglobin: 14.5 g/dL (ref 11.1–15.9)
Immature Grans (Abs): 0 10*3/uL (ref 0.0–0.1)
Immature Granulocytes: 0 %
Lymphocytes Absolute: 1.2 10*3/uL (ref 0.7–3.1)
Lymphs: 24 %
MCH: 30.6 pg (ref 26.6–33.0)
MCHC: 33 g/dL (ref 31.5–35.7)
MCV: 93 fL (ref 79–97)
Monocytes Absolute: 0.5 10*3/uL (ref 0.1–0.9)
Monocytes: 9 %
Neutrophils Absolute: 3.4 10*3/uL (ref 1.4–7.0)
Neutrophils: 65 %
Platelets: 224 10*3/uL (ref 150–450)
RBC: 4.74 x10E6/uL (ref 3.77–5.28)
RDW: 13 % (ref 11.7–15.4)
WBC: 5.2 10*3/uL (ref 3.4–10.8)

## 2022-06-09 LAB — TSH: TSH: 3.01 u[IU]/mL (ref 0.450–4.500)

## 2022-06-13 ENCOUNTER — Encounter (INDEPENDENT_AMBULATORY_CARE_PROVIDER_SITE_OTHER): Payer: Medicare HMO

## 2022-06-13 DIAGNOSIS — H66002 Acute suppurative otitis media without spontaneous rupture of ear drum, left ear: Secondary | ICD-10-CM | POA: Diagnosis not present

## 2022-06-15 MED ORDER — AMOXICILLIN-POT CLAVULANATE 875-125 MG PO TABS: 1.0000 | ORAL_TABLET | Freq: Two times a day (BID) | ORAL | 0 refills | Status: DC

## 2022-06-15 NOTE — Telephone Encounter (Signed)
Hello,  I have sent Augmentin to your pharmacy. Let me know if your symptoms worsen or do not improve.   Evelina Dun, FNP  Approximately 5 minutes was spent documenting and reviewing patient's chart.

## 2022-10-10 ENCOUNTER — Other Ambulatory Visit (HOSPITAL_COMMUNITY): Payer: Self-pay | Admitting: Internal Medicine

## 2022-10-10 ENCOUNTER — Encounter: Payer: Self-pay | Admitting: *Deleted

## 2022-10-10 DIAGNOSIS — Z1231 Encounter for screening mammogram for malignant neoplasm of breast: Secondary | ICD-10-CM

## 2022-10-10 DIAGNOSIS — I779 Disorder of arteries and arterioles, unspecified: Secondary | ICD-10-CM

## 2022-10-10 DIAGNOSIS — R0989 Other specified symptoms and signs involving the circulatory and respiratory systems: Secondary | ICD-10-CM

## 2022-10-24 ENCOUNTER — Encounter (HOSPITAL_COMMUNITY): Payer: Self-pay

## 2022-10-24 ENCOUNTER — Ambulatory Visit (HOSPITAL_COMMUNITY)
Admission: RE | Admit: 2022-10-24 | Discharge: 2022-10-24 | Disposition: A | Discharge status: Home / Self Care | Payer: Medicare HMO | Source: Ambulatory Visit | Attending: Internal Medicine | Admitting: Internal Medicine

## 2022-10-24 DIAGNOSIS — Z1231 Encounter for screening mammogram for malignant neoplasm of breast: Secondary | ICD-10-CM | POA: Diagnosis not present

## 2022-10-24 DIAGNOSIS — I779 Disorder of arteries and arterioles, unspecified: Secondary | ICD-10-CM | POA: Insufficient documentation

## 2022-10-24 DIAGNOSIS — R0989 Other specified symptoms and signs involving the circulatory and respiratory systems: Secondary | ICD-10-CM | POA: Insufficient documentation

## 2022-11-06 ENCOUNTER — Ambulatory Visit: Payer: Medicare HMO | Admitting: Internal Medicine

## 2022-11-06 ENCOUNTER — Encounter: Payer: Self-pay | Admitting: Internal Medicine

## 2022-11-06 DIAGNOSIS — K2100 Gastro-esophageal reflux disease with esophagitis, without bleeding: Secondary | ICD-10-CM

## 2022-11-06 DIAGNOSIS — K5909 Other constipation: Secondary | ICD-10-CM | POA: Diagnosis not present

## 2022-11-06 NOTE — Progress Notes (Unsigned)
Primary Care Physician:  Donetta Potts, MD Primary Gastroenterologist:  Dr. Jena Gauss  Pre-Procedure History & Physical: HPI:  Tabitha Fields is a 77 y.o. female here for  follow-up of GERD.  Does not always take her Nexium daily before a meal.  Also intermittent constipation takes MiraLAX only sporadically.  Also Benefiber sporadically.  Does not take a stool softener.  May go 2 to 3 days without a bowel movement and passes hard balls when she has a BM.   History of colonic adenomas  distant past.  Diverticulosis only on 2016 colonoscopy; the recommended 1 more colonoscopy 2021.  This did not happen.  Reflux fairly well-controlled although she does have breakthrough symptoms occasionally.  Has some difficulty feeling that food traverse her esophagus slowly.  Has a habit of eating fast.  Past Medical History:  Diagnosis Date   GERD (gastroesophageal reflux disease)    Hypercholesteremia    Hypertension    Hypothyroidism    Thyroid disease     Past Surgical History:  Procedure Laterality Date   CATARACT EXTRACTION W/PHACO Left 10/20/2021   Procedure: CATARACT EXTRACTION PHACO AND INTRAOCULAR LENS PLACEMENT (IOC);  Surgeon: Fabio Pierce, MD;  Location: [facility];  Service: Ophthalmology;  Laterality: Left;  CDE 9.12   CATARACT EXTRACTION W/PHACO Right 11/03/2021   Procedure: CATARACT EXTRACTION PHACO AND INTRAOCULAR LENS PLACEMENT (IOC);  Surgeon: Fabio Pierce, MD;  Location: [facility];  Service: Ophthalmology;  Laterality: Right;  CDE: 6.72   COLONOSCOPY  07/28/04   Dr. Betha Loa   COLONOSCOPY  2011   Dr. Jena Gauss: anal canal hemorhoids, pancolonic diverticula, otherwise normal   COLONOSCOPY N/A 03/17/2015   DR. Trayden Brandy: diverticulosis. consider one last tcs in 5 years for prior h/o tubular adenoma.   ESOPHAGOGASTRODUODENOSCOPY (EGD) WITH PROPOFOL N/A 06/06/2017   Dr. Jena Gauss: Slightly baggy, atonic tubular esophagus which otherwise appeared normal, widely patent.  Dilated for history of  dysphagia.  Large hiatal hernia.   HEMORROIDECTOMY     MALONEY DILATION N/A 06/06/2017   Procedure: Elease Hashimoto DILATION;  Surgeon: Corbin Ade, MD;  Location: [facility];  Service: Endoscopy;  Laterality: N/A;   TUBAL LIGATION      Prior to Admission medications   Medication Sig Start Date End Date Taking? Authorizing Provider  Ascorbic Acid (VITAMIN C PO) Take 1 tablet by mouth daily.   Yes [provider]  aspirin EC 81 MG tablet Take 81 mg by mouth daily.   Yes [provider]  calcium carbonate 1250 MG capsule Take 1,250 mg by mouth daily.    Yes [provider]  Coenzyme Q10 (CO Q 10) 100 MG CAPS Take 200 mg by mouth daily.   Yes [provider]  esomeprazole (NEXIUM) 40 MG capsule TAKE ONE CAPSULE BY MOUTH TWICE DAILY BEFORE meals Patient taking differently: Take 40 mg by mouth daily before breakfast. TAKE ONE CAPSULE BY MOUTH DAILY 06/08/22  Yes Hawks, Christy A, FNP  levothyroxine (SYNTHROID) 100 MCG tablet Take 1 tablet (100 mcg total) by mouth daily. 11/28/21  Yes Hawks, Christy A, FNP  lisinopril (ZESTRIL) 10 MG tablet TAKE 1 TABLET BY MOUTH DAILY 06/08/22  Yes Hawks, Neysa Bonito A, FNP  Multiple Vitamin (MULTIVITAMIN) capsule Take 1 capsule by mouth daily.   Yes [provider]  Omega-3 Fatty Acids (FISH OIL) 1200 MG CAPS Take 2 capsules by mouth daily.    Yes [provider]  simvastatin (ZOCOR) 40 MG tablet TAKE 1 TABLET BY MOUTH EVERY DAY  06/08/22  Yes Hawks, Christy A, FNP  Wheat Dextrin (BENEFIBER PO) Take by mouth daily.   Yes [provider]    Allergies as of 11/06/2022   (No Known Allergies)    Family History  Problem Relation Age of Onset   Diabetes Mother    Heart disease Mother    Cancer Father        Bone   Esophageal cancer Brother 27   Heart disease Brother    Brain cancer Sister    Heart disease Brother    Esophageal cancer Brother    Colon cancer Neg Hx     Social History   Socioeconomic  History   Marital status: Widowed    Spouse name: Not on file   Number of children: 2   Years of education: Not on file   Highest education level: Associate degree: academic program  Occupational History   Occupation: retired  Tobacco Use   Smoking status: Never   Smokeless tobacco: Never  Vaping Use   Vaping status: Never Used  Substance and Sexual Activity   Alcohol use: No    Alcohol/week: 0.0 standard drinks of alcohol   Drug use: No   Sexual activity: Not Currently    Birth control/protection: Post-menopausal  Other Topics Concern   Not on file  Social History Narrative   Lives alone on one level   Has friends and family nearby   Social Determinants of Health   Financial Resource Strain: Low Risk  (05/21/2022)   Overall Financial Resource Strain (CARDIA)    Difficulty of Paying Living Expenses: Not hard at all  Food Insecurity: No Food Insecurity (05/21/2022)   Hunger Vital Sign    Worried About Running Out of Food in the Last Year: Never true    Ran Out of Food in the Last Year: Never true  Transportation Needs: No Transportation Needs (05/21/2022)   PRAPARE - Administrator, Civil Service (Medical): No    Lack of Transportation (Non-Medical): No  Physical Activity: Insufficiently Active (05/21/2022)   Exercise Vital Sign    Days of Exercise per Week: 3 days    Minutes of Exercise per Session: 30 min  Stress: No Stress Concern Present (05/21/2022)   [employer] of Occupational Health - Occupational Stress Questionnaire    Feeling of Stress : Not at all  Social Connections: Socially Isolated (05/21/2022)   Social Connection and Isolation Panel [NHANES]    Frequency of Communication with Friends and Family: More than three times a week    Frequency of Social Gatherings with Friends and Family: More than three times a week    Attends Religious Services: Never    Database administrator or Organizations: No    Attends Banker Meetings: Never     Marital Status: Widowed  Intimate Partner Violence: Not At Risk (05/21/2022)   Humiliation, Afraid, Rape, and Kick questionnaire    Fear of Current or Ex-Partner: No    Emotionally Abused: No    Physically Abused: No    Sexually Abused: No    Review of Systems: See HPI, otherwise negative ROS  Physical Exam: BP 125/80 (BP Location: Left Arm, Patient Position: Sitting, Cuff Size: Normal)   Pulse 80   Temp 97.9 F (36.6 C) (Oral)   Ht 5\' 2"  (1.575 m)   Wt 150 lb 12.8 oz (68.4 kg)   SpO2 98%   BMI 27.58 kg/m  General:   Alert,  Well-developed, well-nourished, pleasant and  cooperative in NAD Neck:  Supple; no masses or thyromegaly. No significant cervical adenopathy. Lungs:  Clear throughout to auscultation.   No wheezes, crackles, or rhonchi. No acute distress. Heart:  Regular rate and rhythm; no murmurs, clicks, rubs,  or gallops. Abdomen: Non-distended, normal bowel sounds.  Soft and nontender without appreciable mass or hepatosplenomegaly.   Impression/Plan:    77 year old lady with a known large hiatal hernia for GI dysmotility returns for follow-up.  GERD fairly well-controlled.  Does not take Nexium correctly.  Also poorly managed constipation with sporadic use Benefiber and MiraLAX.  No alarm features.  History of colonic adenoma.  Originally recommended a surveillance examination 2021.  Patient did not return to have that done.  In fact, may not needed at this time.    Recommendations:  As discussed, continue Nexium every day.  However, take it 30 minutes before lunch.  Stop Benefiber.  Begin Colace 100 mg twice daily (stool softener)  Use MiraLAX much more regularly.  Specifically, take a capful in 8 ounces of water every night  Chew food thoroughly, have adequate liquids on hand to assist with swallowing.  Take 30 minutes to consume a meal.  Return 1 stool sample for blood testing in 1 month (after constipation is better controlled).  After some discussion, we  mutually agreed not to pursue a colonoscopy unless clinical status changes.  Office visit in 3 months.    Notice: This dictation was prepared with Dragon dictation along with smaller phrase technology. Any transcriptional errors that result from this process are unintentional and may not be corrected upon review.

## 2022-11-06 NOTE — Patient Instructions (Signed)
It was good to see you again today!  As discussed, continue Nexium every day.  However, take it 30 minutes before lunch.  Stop Benefiber.  Begin Colace 100 mg twice daily (stool softener)  Use MiraLAX much more regularly.  Specifically, I would like you to take a capful in 8 ounces of water every night  Chew food thoroughly, have adequate liquids on hand to assist with swallowing.  Take 30 minutes to consume a meal.  I would like you to return 1 stool sample for blood testing in 1 month (after your constipation is better controlled).  After some discussion, we mutually agreed not to pursue a colonoscopy unless clinical status changes.  Office visit in 3 months.

## 2022-11-11 ENCOUNTER — Other Ambulatory Visit: Payer: Self-pay | Admitting: Family

## 2022-11-11 DIAGNOSIS — E039 Hypothyroidism, unspecified: Secondary | ICD-10-CM

## 2023-01-09 ENCOUNTER — Encounter: Payer: Self-pay | Admitting: Internal Medicine

## 2023-02-12 ENCOUNTER — Ambulatory Visit: Payer: Medicare HMO | Admitting: Internal Medicine

## 2023-02-12 ENCOUNTER — Encounter: Payer: Self-pay | Admitting: Internal Medicine

## 2023-02-12 VITALS — BP 146/88 | HR 99 | Temp 97.9°F | Ht 62.0 in | Wt 151.4 lb

## 2023-02-12 DIAGNOSIS — K219 Gastro-esophageal reflux disease without esophagitis: Secondary | ICD-10-CM | POA: Diagnosis not present

## 2023-02-12 DIAGNOSIS — K5900 Constipation, unspecified: Secondary | ICD-10-CM | POA: Diagnosis not present

## 2023-02-12 DIAGNOSIS — K2100 Gastro-esophageal reflux disease with esophagitis, without bleeding: Secondary | ICD-10-CM

## 2023-02-12 DIAGNOSIS — K5909 Other constipation: Secondary | ICD-10-CM

## 2023-02-12 NOTE — Progress Notes (Unsigned)
Primary Care Physician:  Donetta Potts, MD Primary Gastroenterologist:  Dr. Jena Gauss  Pre-Procedure History & Physical: HPI:  Tabitha Fields is a 77 y.o. female here for for follow-up of vague dysphagia and reflux now taking Nexium properly i.e. 30 minutes before meal.  Taking more time to eat chewing her food thoroughly dysphagia resolved reflux totally resolved she is very happy as far as bowel function goes Colace 100 mg twice daily and MiraLAX on an as-needed basis militates good bowel function weekly she has had no bleeding.  She is not anemic.  Distant history of colonic adenoma but negative colonoscopy 2016 none since that time and as previously, updated colonoscopy not indicated.  Past Medical History:  Diagnosis Date   GERD (gastroesophageal reflux disease)    Hypercholesteremia    Hypertension    Hypothyroidism    Thyroid disease     Past Surgical History:  Procedure Laterality Date   CATARACT EXTRACTION W/PHACO Left 10/20/2021   Procedure: CATARACT EXTRACTION PHACO AND INTRAOCULAR LENS PLACEMENT (IOC);  Surgeon: Fabio Pierce, MD;  Location: [facility];  Service: Ophthalmology;  Laterality: Left;  CDE 9.12   CATARACT EXTRACTION W/PHACO Right 11/03/2021   Procedure: CATARACT EXTRACTION PHACO AND INTRAOCULAR LENS PLACEMENT (IOC);  Surgeon: Fabio Pierce, MD;  Location: [facility];  Service: Ophthalmology;  Laterality: Right;  CDE: 6.72   COLONOSCOPY  07/28/04   Dr. Betha Loa   COLONOSCOPY  2011   Dr. Jena Gauss: anal canal hemorhoids, pancolonic diverticula, otherwise normal   COLONOSCOPY N/A 03/17/2015   DR. Richardo Popoff: diverticulosis. consider one last tcs in 5 years for prior h/o tubular adenoma.   ESOPHAGOGASTRODUODENOSCOPY (EGD) WITH PROPOFOL N/A 06/06/2017   Dr. Jena Gauss: Slightly baggy, atonic tubular esophagus which otherwise appeared normal, widely patent.  Dilated for history of dysphagia.  Large hiatal hernia.   HEMORROIDECTOMY     MALONEY DILATION N/A 06/06/2017   Procedure:  Elease Hashimoto DILATION;  Surgeon: Corbin Ade, MD;  Location: [facility];  Service: Endoscopy;  Laterality: N/A;   TUBAL LIGATION      Prior to Admission medications   Medication Sig Start Date End Date Taking? Authorizing Provider  Ascorbic Acid (VITAMIN C PO) Take 1 tablet by mouth daily.   Yes [provider]  aspirin EC 81 MG tablet Take 81 mg by mouth daily.   Yes [provider]  calcium carbonate 1250 MG capsule Take 1,250 mg by mouth daily.    Yes [provider]  Coenzyme Q10 (CO Q 10) 100 MG CAPS Take 200 mg by mouth daily.   Yes [provider]  esomeprazole (NEXIUM) 40 MG capsule TAKE ONE CAPSULE BY MOUTH TWICE DAILY BEFORE meals Patient taking differently: Take 40 mg by mouth daily before breakfast. TAKE ONE CAPSULE BY MOUTH DAILY 06/08/22  Yes Hawks, Christy A, FNP  levothyroxine (SYNTHROID) 100 MCG tablet Take 1 tablet (100 mcg total) by mouth daily. 11/28/21  Yes Hawks, Christy A, FNP  lisinopril (ZESTRIL) 10 MG tablet TAKE 1 TABLET BY MOUTH DAILY 06/08/22  Yes Hawks, Neysa Bonito A, FNP  Multiple Vitamin (MULTIVITAMIN) capsule Take 1 capsule by mouth daily.   Yes [provider]  Omega-3 Fatty Acids (FISH OIL) 1200 MG CAPS Take 2 capsules by mouth daily.    Yes [provider]  simvastatin (ZOCOR) 40 MG tablet TAKE 1 TABLET BY MOUTH EVERY DAY 06/08/22  Yes Hawks, Christy A, FNP  Wheat Dextrin (BENEFIBER PO) Take by mouth daily.   Yes [provider]    Allergies as of 02/12/2023   (No Known Allergies)    Family History  Problem Relation Age of Onset   Diabetes Mother    Heart disease Mother    Cancer Father        Bone   Esophageal cancer Brother 72   Heart disease Brother    Brain cancer Sister    Heart disease Brother    Esophageal cancer Brother    Colon cancer Neg Hx     Social History   Socioeconomic History   Marital status: Widowed    Spouse name: Not on file   Number of children: 2   Years of  education: Not on file   Highest education level: Associate degree: academic program  Occupational History   Occupation: retired  Tobacco Use   Smoking status: Never   Smokeless tobacco: Never  Vaping Use   Vaping status: Never Used  Substance and Sexual Activity   Alcohol use: No    Alcohol/week: 0.0 standard drinks of alcohol   Drug use: No   Sexual activity: Not Currently    Birth control/protection: Post-menopausal  Other Topics Concern   Not on file  Social History Narrative   Lives alone on one level   Has friends and family nearby   Social Determinants of Health   Financial Resource Strain: Low Risk  (05/21/2022)   Overall Financial Resource Strain (CARDIA)    Difficulty of Paying Living Expenses: Not hard at all  Food Insecurity: No Food Insecurity (05/21/2022)   Hunger Vital Sign    Worried About Running Out of Food in the Last Year: Never true    Ran Out of Food in the Last Year: Never true  Transportation Needs: No Transportation Needs (05/21/2022)   PRAPARE - Administrator, Civil Service (Medical): No    Lack of Transportation (Non-Medical): No  Physical Activity: Insufficiently Active (05/21/2022)   Exercise Vital Sign    Days of Exercise per Week: 3 days    Minutes of Exercise per Session: 30 min  Stress: No Stress Concern Present (05/21/2022)   [employer] of Occupational Health - Occupational Stress Questionnaire    Feeling of Stress : Not at all  Social Connections: Socially Isolated (05/21/2022)   Social Connection and Isolation Panel [NHANES]    Frequency of Communication with Friends and Family: More than three times a week    Frequency of Social Gatherings with Friends and Family: More than three times a week    Attends Religious Services: Never    Database administrator or Organizations: No    Attends Banker Meetings: Never    Marital Status: Widowed  Intimate Partner Violence: Not At Risk (05/21/2022)   Humiliation,  Afraid, Rape, and Kick questionnaire    Fear of Current or Ex-Partner: No    Emotionally Abused: No    Physically Abused: No    Sexually Abused: No    Review of Systems: See HPI, otherwise negative ROS  Physical Exam: BP (!) 146/88 (BP Location: Left Arm, Patient Position: Sitting, Cuff Size: Normal)   Pulse 99   Temp 97.9 F (36.6 C) (Oral)   Ht 5\' 2"  (1.575 m)   Wt 151 lb 6.4 oz (68.7 kg)   SpO2 97%   BMI 27.69 kg/m  General:   Alert,  Well-developed, well-nourished, pleasant and cooperative in NAD Neck:  Supple; no masses or thyromegaly. No significant cervical adenopathy. Lungs:  Clear throughout to auscultation.  No wheezes, crackles, or rhonchi. No acute distress. Heart:  Regular rate and rhythm; no murmurs, clicks, rubs,  or gallops. Abdomen: Non-distended, normal bowel sounds.  Soft and nontender without appreciable mass or hepatosplenomegaly.   Impression/Plan: Pleasant 77 year old lady with GERD and vague dysphagia-symptoms pretty much resolved on appropriate Nexium administration and taking more time to enjoy her meals.  Constipation well-managed with Colace and as needed MiraLAX.  Overall GI symptoms are quiescent no further CRC screening warranted.  Recommendations:  Continue Nexium daily indefinitely.  Continue Colace every day indefinitely.  May use MiraLAX daily on an as-needed basis  No need for further colon cancer screening.  However, if new bowel symptoms arise, please let me know  Office visit with me in 1 year   Notice: This dictation was prepared with Dragon dictation along with smaller phrase technology. Any transcriptional errors that result from this process are unintentional and may not be corrected upon review.

## 2023-02-12 NOTE — Patient Instructions (Signed)
It was very nice to see you again today!  Glad you are swallowing better and your reflux is better controlled on Nexium 40 mg before meal.  Continue Nexium daily indefinitely.  Continue Colace every day indefinitely.  May use MiraLAX daily on an as-needed basis  No need for further colon cancer screening.  However, if new bowel symptoms arise, please let me know  Office visit with me in 1 year

## 2023-08-09 ENCOUNTER — Other Ambulatory Visit: Payer: Self-pay | Admitting: Family

## 2023-08-09 DIAGNOSIS — R1319 Other dysphagia: Secondary | ICD-10-CM

## 2023-08-09 DIAGNOSIS — I10 Essential (primary) hypertension: Secondary | ICD-10-CM

## 2023-08-09 DIAGNOSIS — K449 Diaphragmatic hernia without obstruction or gangrene: Secondary | ICD-10-CM

## 2023-08-09 DIAGNOSIS — K2100 Gastro-esophageal reflux disease with esophagitis, without bleeding: Secondary | ICD-10-CM

## 2023-08-09 DIAGNOSIS — E7800 Pure hypercholesterolemia, unspecified: Secondary | ICD-10-CM

## 2023-09-06 ENCOUNTER — Telehealth: Payer: Self-pay | Admitting: Radiology

## 2023-09-06 NOTE — Telephone Encounter (Signed)
 Patient called [facility] office, wants an appt, can you please call her and schedule? Thanks.

## 2023-09-30 ENCOUNTER — Other Ambulatory Visit (INDEPENDENT_AMBULATORY_CARE_PROVIDER_SITE_OTHER)

## 2023-09-30 ENCOUNTER — Ambulatory Visit: Admitting: Orthopedic Surgery

## 2023-09-30 ENCOUNTER — Encounter: Payer: Self-pay | Admitting: Orthopedic Surgery

## 2023-09-30 ENCOUNTER — Other Ambulatory Visit: Payer: Self-pay

## 2023-09-30 VITALS — BP 136/86 | HR 80 | Ht 62.0 in | Wt 150.0 lb

## 2023-09-30 DIAGNOSIS — M12812 Other specific arthropathies, not elsewhere classified, left shoulder: Secondary | ICD-10-CM

## 2023-09-30 DIAGNOSIS — G8929 Other chronic pain: Secondary | ICD-10-CM | POA: Diagnosis not present

## 2023-09-30 DIAGNOSIS — M12811 Other specific arthropathies, not elsewhere classified, right shoulder: Secondary | ICD-10-CM | POA: Diagnosis not present

## 2023-09-30 DIAGNOSIS — M25512 Pain in left shoulder: Secondary | ICD-10-CM

## 2023-09-30 DIAGNOSIS — M25511 Pain in right shoulder: Secondary | ICD-10-CM | POA: Diagnosis not present

## 2023-09-30 NOTE — Patient Instructions (Addendum)
 Physical therapy has been ordered for you at [facility] in [place]  They should call you to schedule, [phone] is the phone number to call, if you want to call to schedule or if you do not hear anything within a couple days   Tylenol  500 mg every 6 hrs for pain

## 2023-09-30 NOTE — Progress Notes (Signed)
 Chief Complaint  Patient presents with   Shoulder Pain    Both    BP 136/86   Pulse 80   Ht 5' 2 (1.575 m)   Wt 150 lb (68 kg)   BMI 27.44 kg/m   History this is a 78 year old female presents with bilateral shoulder pain which she says began around June 25 of the right shoulder in June 9 for the left shoulder  She believes that overuse while doing yard work started the pain in the left an opening a chlorine canister started the pain on the right  She says she had no other symptoms in the shoulder up until this point.  There was a remote injection in the right shoulder over 15 years ago  The patient is having difficulties with her activities of daily living such as reaching into a cabinet she also notices weakness in the right and left shoulder  Examination reveals a well-developed well-nourished female who is awake alert and oriented x 3 mood and affect are normal neurovascular exam is intact  On the right shoulder we find external rotation is a hard stop at 15 degrees abduction 90 degrees flexion 110 degrees with mild weakness  On the left side we have 0 degrees external rotation 70 degrees abduction only 70 degrees of flexion  DG Shoulder Left Result Date: 09/30/2023 Left shoulder Left shoulder pain AP lateral left shoulder Again we see osteophytes on the humeral head proximal migration of the humeral head incongruency of the glenohumeral joint Rotator cuff arthropathy   DG Shoulder Right Result Date: 09/30/2023 Painful right shoulder X-ray shows proximal migration of the humeral head and congruency of the joint probable articulation between the acromion numeral head suggesting chronic rotator cuff disease Impression rotator cuff arthropathy   Assessment and plan  Encounter Diagnoses  Name Primary?   Chronic pain of both shoulders    Rotator cuff arthropathy of right shoulder Yes   Rotator cuff arthropathy of left shoulder     78 year old female recent onset of shoulder  pain no prior treatment recommend bilateral subacromial injections and physical therapy with a 91-month follow-up  We discussed both of the shoulders probably rotator cuff tears and would need shoulder placement for definitive management   Procedure injection right shoulder subacromial joint and left shoulder subacromial joint   procedure note the subacromial injection shoulder RIGHT  Verbal consent was obtained to inject the  RIGHT   Shoulder  Timeout was completed to confirm the injection site is a subacromial space of the  RIGHT  shoulder   Medication used Depo-Medrol 40 mg and lidocaine  1% 3 cc  Anesthesia was provided by ethyl chloride  The injection was performed in the RIGHT  posterior subacromial space. After pinning the skin with alcohol and anesthetized the skin with ethyl chloride the subacromial space was injected using a 20-gauge needle. There were no complications  Sterile dressing was applied.    Procedure note the subacromial injection shoulder left   Verbal consent was obtained to inject the  Left   Shoulder  Timeout was completed to confirm the injection site is a subacromial space of the  left  shoulder  Medication used Depo-Medrol 40 mg and lidocaine  1% 3 cc  Anesthesia was provided by ethyl chloride  The injection was performed in the left  posterior subacromial space. After pinning the skin with alcohol and anesthetized the skin with ethyl chloride the subacromial space was injected using a 20-gauge needle. There were no complications  Sterile  dressing was applied.

## 2023-09-30 NOTE — Progress Notes (Signed)
  Intake history:  BP 136/86   Pulse 80   Ht 5' 2 (1.575 m)   Wt 150 lb (68 kg)   BMI 27.44 kg/m  Body mass index is 27.44 kg/m.    WHAT ARE WE SEEING YOU FOR TODAY?   bilateral shoulder  How long has this bothered you? (DOI?DOS?WS?)  on 09/09/23 left  on 09/25/23 right   Anticoag.  No  Diabetes No  Heart disease No  Hypertension Yes  SMOKING HX No  Kidney disease No  Any ALLERGIES _________No Known Allergies _____________________________________   Treatment:  Have you taken:  Tylenol  No  Advil No  Had PT No  Had injection No  Other  _________________________

## 2023-10-10 NOTE — Therapy (Signed)
 OUTPATIENT PHYSICAL THERAPY EVALUATION   Patient Name: Tabitha Fields MRN: [id number] DOB:12/16/1945, 78 y.o., female Today's Date: 10/11/2023  END OF SESSION:  PT End of Session - 10/11/23 0839     Visit Number 1    Number of Visits 12    Date for PT Re-Evaluation 11/22/23    Authorization Type MCR    Progress Note Due on Visit 10    PT Start Time 0755    PT Stop Time 0839    PT Time Calculation (min) 44 min    Activity Tolerance Patient tolerated treatment well    Behavior During Therapy [facility] for tasks assessed/performed          Past Medical History:  Diagnosis Date   GERD (gastroesophageal reflux disease)    Hypercholesteremia    Hypertension    Hypothyroidism    Thyroid  disease    Past Surgical History:  Procedure Laterality Date   CATARACT EXTRACTION W/PHACO Left 10/20/2021   Procedure: CATARACT EXTRACTION PHACO AND INTRAOCULAR LENS PLACEMENT (IOC);  Surgeon: Harrie Agent, MD;  Location: [facility];  Service: Ophthalmology;  Laterality: Left;  CDE 9.12   CATARACT EXTRACTION W/PHACO Right 11/03/2021   Procedure: CATARACT EXTRACTION PHACO AND INTRAOCULAR LENS PLACEMENT (IOC);  Surgeon: Harrie Agent, MD;  Location: [facility];  Service: Ophthalmology;  Laterality: Right;  CDE: 6.72   COLONOSCOPY  07/28/04   Dr. Dickie   COLONOSCOPY  2011   Dr. Shaaron: anal canal hemorhoids, pancolonic diverticula, otherwise normal   COLONOSCOPY N/A 03/17/2015   DR. Rourk: diverticulosis. consider one last tcs in 5 years for prior h/o tubular adenoma.   ESOPHAGOGASTRODUODENOSCOPY (EGD) WITH PROPOFOL  N/A 06/06/2017   Dr. Shaaron: Slightly baggy, atonic tubular esophagus which otherwise appeared normal, widely patent.  Dilated for history of dysphagia.  Large hiatal hernia.   HEMORROIDECTOMY     MALONEY DILATION N/A 06/06/2017   Procedure: AGAPITO DILATION;  Surgeon: Shaaron Lamar HERO, MD;  Location: [facility];  Service: Endoscopy;  Laterality: N/A;   TUBAL LIGATION     Patient Active  Problem List   Diagnosis Date Noted   Essential hypertension 09/23/2018   Hiatal hernia 07/18/2017   FHx: esophageal cancer 05/29/2017   Gastroesophageal reflux disease with esophagitis 05/28/2017   Esophageal dysphagia 05/28/2017   Allergic rhinitis due to pollen 11/21/2016   Osteopenia 06/20/2016   Acquired hypothyroidism 06/19/2016   Pure hypercholesterolemia 06/19/2016   Diverticulosis of colon without hemorrhage    History of colonic polyps 03/04/2015    PCP: Trudy Vaughn FALCON, MD   REFERRING PROVIDER: Margrette Taft BRAVO, MD   REFERRING DIAG: M25.511,G89.29,M25.512 (ICD-10-CM) - Chronic pain of both shoulders   Rationale for Evaluation and Treatment:  Rehabiliation  THERAPY DIAG:  Chronic right shoulder pain  Chronic left shoulder pain  Muscle weakness (generalized)  ONSET DATE: began around May 2025  SUBJECTIVE:  SUBJECTIVE STATEMENT: 78 year old female presents with bilateral shoulder pain which she says began around may 2025 and then become worse in June so she went to see the MD. She did have recent injecitons 09/30/23 which have helped a lot.   She believes that overuse while doing yard work started the pain i  PERTINENT HISTORY:  See above PMH  PAIN:  NPRS scale: 7 before injections, 3 now at rest, and 7 with lifting  /10 upon arrival Pain location:bilat shoulders Pain description: intermittent since the injections, sharp twinge Aggravating factors: lifting and reaching,  Relieving factors: ice, does not like to take meds, shoulder brace   PRECAUTIONS: ,  None  RED FLAGS: None   WEIGHT BEARING RESTRICTIONS:  NO  FALLS:  Has patient fallen in last 6 months? No  PLOF:  Independent with basic ADLs  PATIENT GOALS:  Reduce pain  OBJECTIVE:  Note: Objective  measures were completed at Evaluation unless otherwise noted.  DIAGNOSTIC FINDINGS:  Bilat shoulder XR Impression rotator cuff arthropathy   PATIENT SURVEYS:  Patient-Specific Activity Scoring Scheme  0 represents "unable to perform." 10 represents "able to perform at prior level. 0 1 2 3 4 5 6 7 8 9  10 (Date and Score)   Activity Eval     1. Lift objects over shoulder 2                       Score 2/10    Total score = sum of the activity scores/number of activities Minimum detectable change (90%CI) for average score = 2 points Minimum detectable change (90%CI) for single activity score = 3 points     EDEMA:  No    Shoulder  PALPATION: Pain and tenderness at posterior RTC tendon, suprapsinatus  UPPER EXTREMITY ROM:  Active ROM/PROM Right eval Left eval  Shoulder flexion 105/115 105/115  Shoulder extension    Shoulder abduction 85/ 85/  Shoulder adduction    Shoulder extension    Shoulder internal rotation L4/ L3/  Shoulder external rotation To right ear/20 To C1/25  Elbow flexion    Elbow extension    Wrist flexion    Wrist extension    Wrist ulnar deviation    Wrist radial deviation    Wrist pronation    Wrist supination     (Blank rows = not tested)   UPPER EXTREMITY MMT:  MMT Right eval Left eval  Shoulder flexion 3 3  Shoulder extension    Shoulder abduction 3 3  Shoulder adduction    Shoulder extension    Shoulder internal rotation 3 3  Shoulder external rotation 2 2  Middle trapezius    Lower trapezius    Elbow flexion    Elbow extension    Wrist flexion    Wrist extension    Wrist ulnar deviation    Wrist radial deviation    Wrist pronation    Wrist supination    Grip strength 4 4   (Blank rows = not tested)  TREATMENT DATE:  Eval HEP creation and review with demonstration and trial set preformed,  see below for details Selfcare:instructions to keep exercises gentle and not push into pain. The need for gentle pain free ROM more frequently during the day in small amounts that do not aggravate her shoulder. Avoid lifting heavy     PATIENT EDUCATION: Education details: HEP, PT plan of care, selfcare Person educated: Patient Education method: Explanation, Demonstration, Verbal cues, and Handouts Education comprehension: verbalized understanding, further education recommended   HOME EXERCISE PROGRAM: Access Code: [email or web address] URL: [email or web address] Date: 10/11/2023 Prepared by: Redell Moose  Exercises - Shoulder Flexion Overhead with Dowel  - 2-3 x daily - 6 x weekly - 1 sets - 10 reps - Standing Shoulder Abduction AAROM with Dowel  - 2-3 x daily - 6 x weekly - 1 sets - 10 reps - Seated Shoulder External Rotation AAROM with Cane and Hand in Neutral  - 2-3 x daily - 6 x weekly - 1 sets - 10 reps - Standing Shoulder Row with Anchored Resistance  - 2 x daily - 6 x weekly - 2 sets - 10 reps - Chest Press with Resistance  - 2 x daily - 6 x weekly - 2 sets - 10-20 reps  ASSESSMENT:  CLINICAL IMPRESSION: Patient referred to PT for evaluate and treat bilateral shoulder pain with rotator cuff arthropathy and very stiff shoulders limiting her functional ROM.  Patient will benefit from skilled PT to improve overall function and to address impairments and limitations listed below.  OBJECTIVE IMPAIRMENTS: decreased activity tolerance for ADL's,  decreased ROM, decreased strength, impaired flexibility, impaired UE use, and pain.  ACTIVITY LIMITATIONS: lifting, carry, reaching, cleaning  PERSONAL FACTORS: see above PMH  also affecting patient's functional outcome.  REHAB POTENTIAL: Good  CLINICAL DECISION MAKING: Stable/uncomplicated  EVALUATION COMPLEXITY: Low    GOALS: Short term PT Goals Target date: 11/08/2023  Pt will be I and compliant with HEP. Baseline:  Goal  status: New Pt will decrease pain by 25% overall with reaching Baseline: Goal status: New  Long term PT goals Target date:11/22/2023   Pt will improve bilat shouler AROM to [facility] >90 deg abd and > 110 flexion, and able to reach behind head to improve functional mobility Baseline: Goal status: New Pt will improve bilat shoulder strength to at least 4/5 MMT to improve functional strength Baseline: Goal status: New Pt will improve Patient specific functional scale (PSFS) to at least 6/10 to show improved function level Baseline:2/10 Goal status: New Pt will reduce pain to overall less than 3/10 with usual activity. Baseline: Goal status: New  PLAN: PT FREQUENCY: 2-3 times per week   PT DURATION: 6 weeks  PLANNED INTERVENTIONS (unless contraindicated):  97110-Therapeutic exercises, 97530- Therapeutic activity, [id number]- Neuromuscular re-education, 97535- Self Care, 02859- Manual therapy, [id number]- Electrical stimulation (unattended), 97016- Vasopneumatic device, [id number]- Ultrasound, and 97033- Ionotophoresis 4mg /ml Dexamethasone   PLAN FOR NEXT SESSION: review HEP, gentle ROM and strength that do not aggravate symtpoms.  NEXT MD VISIT: 12/30/23  Redell JONELLE Moose, PT,DPT 10/11/2023, 8:40 AM

## 2023-10-11 ENCOUNTER — Ambulatory Visit: Attending: Orthopedic Surgery | Admitting: Physical Therapy

## 2023-10-11 ENCOUNTER — Encounter: Payer: Self-pay | Admitting: Physical Therapy

## 2023-10-11 DIAGNOSIS — G8929 Other chronic pain: Secondary | ICD-10-CM | POA: Insufficient documentation

## 2023-10-11 DIAGNOSIS — M25512 Pain in left shoulder: Secondary | ICD-10-CM | POA: Diagnosis present

## 2023-10-11 DIAGNOSIS — M25511 Pain in right shoulder: Secondary | ICD-10-CM | POA: Insufficient documentation

## 2023-10-11 DIAGNOSIS — M6281 Muscle weakness (generalized): Secondary | ICD-10-CM | POA: Diagnosis present

## 2023-10-15 ENCOUNTER — Ambulatory Visit: Admitting: Physical Therapy

## 2023-10-15 ENCOUNTER — Encounter: Payer: Self-pay | Admitting: Physical Therapy

## 2023-10-15 DIAGNOSIS — M6281 Muscle weakness (generalized): Secondary | ICD-10-CM

## 2023-10-15 DIAGNOSIS — G8929 Other chronic pain: Secondary | ICD-10-CM

## 2023-10-15 DIAGNOSIS — M25511 Pain in right shoulder: Secondary | ICD-10-CM | POA: Diagnosis not present

## 2023-10-15 NOTE — Therapy (Signed)
 OUTPATIENT PHYSICAL THERAPY TREATMENT   Patient Name: Tabitha Fields MRN: [id number] DOB:1945-09-25, 78 y.o., female Today's Date: 10/15/2023  END OF SESSION:  PT End of Session - 10/15/23 0840     Visit Number 2    Number of Visits 12    Date for PT Re-Evaluation 11/22/23    Authorization Type MCR    Progress Note Due on Visit 10    PT Start Time 0755    PT Stop Time 0837    PT Time Calculation (min) 42 min    Activity Tolerance Patient tolerated treatment well    Behavior During Therapy [facility] for tasks assessed/performed           Past Medical History:  Diagnosis Date   GERD (gastroesophageal reflux disease)    Hypercholesteremia    Hypertension    Hypothyroidism    Thyroid  disease    Past Surgical History:  Procedure Laterality Date   CATARACT EXTRACTION W/PHACO Left 10/20/2021   Procedure: CATARACT EXTRACTION PHACO AND INTRAOCULAR LENS PLACEMENT (IOC);  Surgeon: Harrie Agent, MD;  Location: [facility];  Service: Ophthalmology;  Laterality: Left;  CDE 9.12   CATARACT EXTRACTION W/PHACO Right 11/03/2021   Procedure: CATARACT EXTRACTION PHACO AND INTRAOCULAR LENS PLACEMENT (IOC);  Surgeon: Harrie Agent, MD;  Location: [facility];  Service: Ophthalmology;  Laterality: Right;  CDE: 6.72   COLONOSCOPY  07/28/04   Dr. Dickie   COLONOSCOPY  2011   Dr. Shaaron: anal canal hemorhoids, pancolonic diverticula, otherwise normal   COLONOSCOPY N/A 03/17/2015   DR. Rourk: diverticulosis. consider one last tcs in 5 years for prior h/o tubular adenoma.   ESOPHAGOGASTRODUODENOSCOPY (EGD) WITH PROPOFOL  N/A 06/06/2017   Dr. Shaaron: Slightly baggy, atonic tubular esophagus which otherwise appeared normal, widely patent.  Dilated for history of dysphagia.  Large hiatal hernia.   HEMORROIDECTOMY     MALONEY DILATION N/A 06/06/2017   Procedure: AGAPITO DILATION;  Surgeon: Shaaron Lamar HERO, MD;  Location: [facility];  Service: Endoscopy;  Laterality: N/A;   TUBAL LIGATION     Patient  Active Problem List   Diagnosis Date Noted   Essential hypertension 09/23/2018   Hiatal hernia 07/18/2017   FHx: esophageal cancer 05/29/2017   Gastroesophageal reflux disease with esophagitis 05/28/2017   Esophageal dysphagia 05/28/2017   Allergic rhinitis due to pollen 11/21/2016   Osteopenia 06/20/2016   Acquired hypothyroidism 06/19/2016   Pure hypercholesterolemia 06/19/2016   Diverticulosis of colon without hemorrhage    History of colonic polyps 03/04/2015    PCP: Trudy Vaughn FALCON, MD   REFERRING PROVIDER: Trudy Vaughn FALCON, MD   REFERRING DIAG: M25.511,G89.29,M25.512 (ICD-10-CM) - Chronic pain of both shoulders   Rationale for Evaluation and Treatment:  Rehabiliation  THERAPY DIAG:  Chronic right shoulder pain  Chronic left shoulder pain  Muscle weakness (generalized)  ONSET DATE: began around May 2025  SUBJECTIVE:  SUBJECTIVE STATEMENT: She has been doing the exercises, does have some questions about them. Feels she is making some early progress   She believes that overuse while doing yard work started the pain i  PERTINENT HISTORY:  See above PMH  PAIN:  NPRS scale:5/10 Pain location:bilat shoulders Pain description: intermittent since the injections, sharp twinge Aggravating factors: lifting and reaching,  Relieving factors: ice, does not like to take meds, shoulder brace   PRECAUTIONS: ,  None  RED FLAGS: None   WEIGHT BEARING RESTRICTIONS:  NO  FALLS:  Has patient fallen in last 6 months? No  PLOF:  Independent with basic ADLs  PATIENT GOALS:  Reduce pain  OBJECTIVE:  Note: Objective measures were completed at Evaluation unless otherwise noted.  DIAGNOSTIC FINDINGS:  Bilat shoulder XR Impression rotator cuff arthropathy   PATIENT SURVEYS:   Patient-Specific Activity Scoring Scheme  0 represents "unable to perform." 10 represents "able to perform at prior level. 0 1 2 3 4 5 6 7 8 9  10 (Date and Score)   Activity Eval     1. Lift objects over shoulder 2                       Score 2/10    Total score = sum of the activity scores/number of activities Minimum detectable change (90%CI) for average score = 2 points Minimum detectable change (90%CI) for single activity score = 3 points     EDEMA:  No    Shoulder  PALPATION: Pain and tenderness at posterior RTC tendon, suprapsinatus  UPPER EXTREMITY ROM:  Active ROM/PROM Right eval Left eval  Shoulder flexion 105/115 105/115  Shoulder extension    Shoulder abduction 85/ 85/  Shoulder adduction    Shoulder extension    Shoulder internal rotation L4/ L3/  Shoulder external rotation To right ear/20 To C1/25  Elbow flexion    Elbow extension    Wrist flexion    Wrist extension    Wrist ulnar deviation    Wrist radial deviation    Wrist pronation    Wrist supination     (Blank rows = not tested)   UPPER EXTREMITY MMT:  MMT Right eval Left eval  Shoulder flexion 3 3  Shoulder extension    Shoulder abduction 3 3  Shoulder adduction    Shoulder extension    Shoulder internal rotation 3 3  Shoulder external rotation 2 2  Middle trapezius    Lower trapezius    Elbow flexion    Elbow extension    Wrist flexion    Wrist extension    Wrist ulnar deviation    Wrist radial deviation    Wrist pronation    Wrist supination    Grip strength 4 4   (Blank rows = not tested)  TREATMENT DATE:  10/15/23 HEP review with education, cuing, demo provided UBE L1 2 min fwd, 2 min backward Wall ladder AAROM flexion X 5 each side UE ranger X 5 flexion, X 5 circles CW,CCW, X 5 abduction, performed each side Pball roll up wall for  shoulder flexion AAROM X 10 reps Manual therapy for shoulder ER with overpressure to tolerance, performed bilat Shoulder ER AAROM seated with cane and towel roll X 10 reps bilat Shoulder ER AAROM table stretch X 5 bilat Standing ER AAROM doorway stretch X 5 bilat    PATIENT EDUCATION: Education details: HEP, PT plan of care, selfcare Person educated: Patient Education method: Explanation, Demonstration, Verbal cues, and Handouts Education comprehension: verbalized understanding, further education recommended   HOME EXERCISE PROGRAM: Access Code: [email or web address] URL: [email or web address] Date: 10/11/2023 Prepared by: Redell Moose  Exercises - Shoulder Flexion Overhead with Dowel  - 2-3 x daily - 6 x weekly - 1 sets - 10 reps - Standing Shoulder Abduction AAROM with Dowel  - 2-3 x daily - 6 x weekly - 1 sets - 10 reps - Seated Shoulder External Rotation AAROM with Cane and Hand in Neutral  - 2-3 x daily - 6 x weekly - 1 sets - 10 reps - Standing Shoulder Row with Anchored Resistance  - 2 x daily - 6 x weekly - 2 sets - 10 reps - Chest Press with Resistance  - 2 x daily - 6 x weekly - 2 sets - 10-20 reps  ASSESSMENT:  CLINICAL IMPRESSION: She is showing some good early progress with her ROM but still very stiff in ER ROM which was the focus of our session today. HEP was reviewed and modified some and she shows good understanding of them.   OBJECTIVE IMPAIRMENTS: decreased activity tolerance for ADL's,  decreased ROM, decreased strength, impaired flexibility, impaired UE use, and pain.  ACTIVITY LIMITATIONS: lifting, carry, reaching, cleaning  PERSONAL FACTORS: see above PMH  also affecting patient's functional outcome.  REHAB POTENTIAL: Good  CLINICAL DECISION MAKING: Stable/uncomplicated  EVALUATION COMPLEXITY: Low    GOALS: Short term PT Goals Target date: 11/08/2023  Pt will be I and compliant with HEP. Baseline:  Goal status: New Pt will decrease pain by 25%  overall with reaching Baseline: Goal status: New  Long term PT goals Target date:11/22/2023   Pt will improve bilat shouler AROM to [facility] >90 deg abd and > 110 flexion, and able to reach behind head to improve functional mobility Baseline: Goal status: New Pt will improve bilat shoulder strength to at least 4/5 MMT to improve functional strength Baseline: Goal status: New Pt will improve Patient specific functional scale (PSFS) to at least 6/10 to show improved function level Baseline:2/10 Goal status: New Pt will reduce pain to overall less than 3/10 with usual activity. Baseline: Goal status: New  PLAN: PT FREQUENCY: 2-3 times per week   PT DURATION: 6 weeks  PLANNED INTERVENTIONS (unless contraindicated):  97110-Therapeutic exercises, 97530- Therapeutic activity, [id number]- Neuromuscular re-education, 97535- Self Care, 02859- Manual therapy, [id number]- Electrical stimulation (unattended), 97016- Vasopneumatic device, [id number]- Ultrasound, and 97033- Ionotophoresis 4mg /ml Dexamethasone   PLAN FOR NEXT SESSION: gentle ROM and strength that do not aggravate symtpoms.  NEXT MD VISIT: 12/30/23  Redell JONELLE Moose, PT,DPT 10/15/2023, 8:41 AM

## 2023-10-18 ENCOUNTER — Ambulatory Visit: Admitting: Physical Therapy

## 2023-10-18 ENCOUNTER — Encounter: Payer: Self-pay | Admitting: Physical Therapy

## 2023-10-18 DIAGNOSIS — M25511 Pain in right shoulder: Secondary | ICD-10-CM | POA: Diagnosis not present

## 2023-10-18 DIAGNOSIS — G8929 Other chronic pain: Secondary | ICD-10-CM

## 2023-10-18 DIAGNOSIS — M6281 Muscle weakness (generalized): Secondary | ICD-10-CM

## 2023-10-18 NOTE — Therapy (Signed)
 OUTPATIENT PHYSICAL THERAPY TREATMENT   Patient Name: Tabitha Fields MRN: [id number] DOB:[birth year], 78 y.o., female Today's Date: 10/18/2023  END OF SESSION:  PT End of Session - 10/18/23 0830     Visit Number 3    Number of Visits 12    Date for PT Re-Evaluation 11/22/23    Authorization Type MCR    Progress Note Due on Visit 10    PT Start Time 0753    PT Stop Time 0831    PT Time Calculation (min) 38 min    Activity Tolerance Patient tolerated treatment well    Behavior During Therapy [facility] for tasks assessed/performed            Past Medical History:  Diagnosis Date   GERD (gastroesophageal reflux disease)    Hypercholesteremia    Hypertension    Hypothyroidism    Thyroid  disease    Past Surgical History:  Procedure Laterality Date   CATARACT EXTRACTION W/PHACO Left 10/20/2021   Procedure: CATARACT EXTRACTION PHACO AND INTRAOCULAR LENS PLACEMENT (IOC);  Surgeon: Harrie Agent, MD;  Location: [facility];  Service: Ophthalmology;  Laterality: Left;  CDE 9.12   CATARACT EXTRACTION W/PHACO Right 11/03/2021   Procedure: CATARACT EXTRACTION PHACO AND INTRAOCULAR LENS PLACEMENT (IOC);  Surgeon: Harrie Agent, MD;  Location: [facility];  Service: Ophthalmology;  Laterality: Right;  CDE: 6.72   COLONOSCOPY  07/28/04   Dr. Dickie   COLONOSCOPY  2011   Dr. Shaaron: anal canal hemorhoids, pancolonic diverticula, otherwise normal   COLONOSCOPY N/A 03/17/2015   DR. Rourk: diverticulosis. consider one last tcs in 5 years for prior h/o tubular adenoma.   ESOPHAGOGASTRODUODENOSCOPY (EGD) WITH PROPOFOL  N/A 06/06/2017   Dr. Shaaron: Slightly baggy, atonic tubular esophagus which otherwise appeared normal, widely patent.  Dilated for history of dysphagia.  Large hiatal hernia.   HEMORROIDECTOMY     MALONEY DILATION N/A 06/06/2017   Procedure: AGAPITO DILATION;  Surgeon: Shaaron Lamar HERO, MD;  Location: [facility];  Service: Endoscopy;  Laterality: N/A;   TUBAL LIGATION     Patient  Active Problem List   Diagnosis Date Noted   Essential hypertension 09/23/2018   Hiatal hernia 07/18/2017   FHx: esophageal cancer 05/29/2017   Gastroesophageal reflux disease with esophagitis 05/28/2017   Esophageal dysphagia 05/28/2017   Allergic rhinitis due to pollen 11/21/2016   Osteopenia 06/20/2016   Acquired hypothyroidism 06/19/2016   Pure hypercholesterolemia 06/19/2016   Diverticulosis of colon without hemorrhage    History of colonic polyps 03/04/2015    PCP: Trudy Vaughn FALCON, MD   REFERRING PROVIDER: Trudy Vaughn FALCON, MD   REFERRING DIAG: M25.511,G89.29,M25.512 (ICD-10-CM) - Chronic pain of both shoulders   Rationale for Evaluation and Treatment:  Rehabiliation  THERAPY DIAG:  Chronic right shoulder pain  Chronic left shoulder pain  Muscle weakness (generalized)  ONSET DATE: began around May 2025  SUBJECTIVE:  SUBJECTIVE STATEMENT: She has some soreness from yardwork.     PERTINENT HISTORY:  See above PMH  PAIN:  NPRS scale:5/10 Pain location:bilat shoulders Pain description: intermittent since the injections, sharp twinge Aggravating factors: lifting and reaching,  Relieving factors: ice, does not like to take meds, shoulder brace   PRECAUTIONS: ,  None  RED FLAGS: None   WEIGHT BEARING RESTRICTIONS:  NO  FALLS:  Has patient fallen in last 6 months? No  PLOF:  Independent with basic ADLs  PATIENT GOALS:  Reduce pain  OBJECTIVE:  Note: Objective measures were completed at Evaluation unless otherwise noted.  DIAGNOSTIC FINDINGS:  Bilat shoulder XR Impression rotator cuff arthropathy   PATIENT SURVEYS:  Patient-Specific Activity Scoring Scheme  0 represents "unable to perform." 10 represents "able to perform at prior level. 0 1 2 3 4 5 6 7  8 9 10  (Date and Score)   Activity Eval     1. Lift objects over shoulder 2                       Score 2/10    Total score = sum of the activity scores/number of activities Minimum detectable change (90%CI) for average score = 2 points Minimum detectable change (90%CI) for single activity score = 3 points     EDEMA:  No    Shoulder  PALPATION: Pain and tenderness at posterior RTC tendon, suprapsinatus  UPPER EXTREMITY ROM:  Active ROM/PROM Right eval Left eval  Shoulder flexion 105/115 105/115  Shoulder extension    Shoulder abduction 85/ 85/  Shoulder adduction    Shoulder extension    Shoulder internal rotation L4/ L3/  Shoulder external rotation To right ear/20 To C1/25  Elbow flexion    Elbow extension    Wrist flexion    Wrist extension    Wrist ulnar deviation    Wrist radial deviation    Wrist pronation    Wrist supination     (Blank rows = not tested)   UPPER EXTREMITY MMT:  MMT Right eval Left eval  Shoulder flexion 3 3  Shoulder extension    Shoulder abduction 3 3  Shoulder adduction    Shoulder extension    Shoulder internal rotation 3 3  Shoulder external rotation 2 2  Middle trapezius    Lower trapezius    Elbow flexion    Elbow extension    Wrist flexion    Wrist extension    Wrist ulnar deviation    Wrist radial deviation    Wrist pronation    Wrist supination    Grip strength 4 4   (Blank rows = not tested)                                                                                                                             TREATMENT DATE:  10/18/23 UBE L1 2.5 min fwd, 2.5 min backward Wall ladder AAROM flexion X  5 each side UE ranger X 10 flexion, X 10 circles CW,CCW, X 10 abduction, performed each side Pball roll up wall for shoulder flexion AAROM X 10 reps Seated shoulder ER table stretch 5 sec hold X 10 bilat Standing rows red 2X10 bilat Standing shoulder extensions red 2X10 bilat Standing chest  press/forward reach red 2X 10 bilat Standing shoulder IR red 2X10 bilat with towel roll Standing shoulder ER red 2X10 bilat with towel roll  10/15/23 HEP review with education, cuing, demo provided UBE L1 2 min fwd, 2 min backward Wall ladder AAROM flexion X 5 each side UE ranger X 5 flexion, X 5 circles CW,CCW, X 5 abduction, performed each side Pball roll up wall for shoulder flexion AAROM X 10 reps Manual therapy for shoulder ER with overpressure to tolerance, performed bilat Shoulder ER AAROM seated with cane and towel roll X 10 reps bilat Shoulder ER AAROM table stretch X 5 bilat Standing ER AAROM doorway stretch X 5 bilat    PATIENT EDUCATION: Education details: HEP, PT plan of care, selfcare Person educated: Patient Education method: Explanation, Demonstration, Verbal cues, and Handouts Education comprehension: verbalized understanding, further education recommended   HOME EXERCISE PROGRAM: Access Code: [email or web address] URL: [email or web address] Date: 10/11/2023 Prepared by: Redell Moose  Exercises - Shoulder Flexion Overhead with Dowel  - 2-3 x daily - 6 x weekly - 1 sets - 10 reps - Standing Shoulder Abduction AAROM with Dowel  - 2-3 x daily - 6 x weekly - 1 sets - 10 reps - Seated Shoulder External Rotation AAROM with Cane and Hand in Neutral  - 2-3 x daily - 6 x weekly - 1 sets - 10 reps - Standing Shoulder Row with Anchored Resistance  - 2 x daily - 6 x weekly - 2 sets - 10 reps - Chest Press with Resistance  - 2 x daily - 6 x weekly - 2 sets - 10-20 reps  ASSESSMENT:  CLINICAL IMPRESSION: ROM was noticeably better today and she continues to show good HEP compliance. PT recommending to continue with current therapy plan as we are seeing some early progress.   OBJECTIVE IMPAIRMENTS: decreased activity tolerance for ADL's,  decreased ROM, decreased strength, impaired flexibility, impaired UE use, and pain.  ACTIVITY LIMITATIONS: lifting, carry, reaching,  cleaning  PERSONAL FACTORS: see above PMH  also affecting patient's functional outcome.  REHAB POTENTIAL: Good  CLINICAL DECISION MAKING: Stable/uncomplicated  EVALUATION COMPLEXITY: Low    GOALS: Short term PT Goals Target date: 11/08/2023  Pt will be I and compliant with HEP. Baseline:  Goal status: MET 10/18/23 Pt will decrease pain by 25% overall with reaching Baseline: Goal status: ongoing 10/18/23  Long term PT goals Target date:11/22/2023   Pt will improve bilat shouler AROM to [facility] >90 deg abd and > 110 flexion, and able to reach behind head to improve functional mobility Baseline: Goal status: ongoing 10/18/23 Pt will improve bilat shoulder strength to at least 4/5 MMT to improve functional strength Baseline: Goal status: ongoing 10/18/23 Pt will improve Patient specific functional scale (PSFS) to at least 6/10 to show improved function level Baseline:2/10 Goal status: ongoing 10/18/23 Pt will reduce pain to overall less than 3/10 with usual activity. Baseline: Goal status: ongoing 10/18/23  PLAN: PT FREQUENCY: 2-3 times per week   PT DURATION: 6 weeks  PLANNED INTERVENTIONS (unless contraindicated):  97110-Therapeutic exercises, 97530- Therapeutic activity, [id number]- Neuromuscular re-education, 97535- Self Care, 02859- Manual therapy, [id number]- Electrical stimulation (unattended), 97016- Vasopneumatic device, [id number]- Ultrasound, and [id number]-  Ionotophoresis 4mg /ml Dexamethasone   PLAN FOR NEXT SESSION: gentle ROM and strength that do not aggravate symtpoms.  NEXT MD VISIT: 12/30/23  Redell JONELLE Moose, PT,DPT 10/18/2023, 8:31 AM

## 2023-10-22 ENCOUNTER — Encounter: Payer: Self-pay | Admitting: Physical Therapy

## 2023-10-22 ENCOUNTER — Ambulatory Visit: Admitting: Physical Therapy

## 2023-10-22 DIAGNOSIS — M6281 Muscle weakness (generalized): Secondary | ICD-10-CM

## 2023-10-22 DIAGNOSIS — G8929 Other chronic pain: Secondary | ICD-10-CM

## 2023-10-22 DIAGNOSIS — M25511 Pain in right shoulder: Secondary | ICD-10-CM | POA: Diagnosis not present

## 2023-10-22 NOTE — Therapy (Signed)
 OUTPATIENT PHYSICAL THERAPY TREATMENT   Patient Name: Tabitha Fields MRN: [id number] DOB:05-23-1945, 78 y.o., female Today's Date: 10/22/2023  END OF SESSION:  PT End of Session - 10/22/23 0836     Visit Number 4    Number of Visits 12    Date for PT Re-Evaluation 11/22/23    Authorization Type MCR    Progress Note Due on Visit 10    PT Start Time 0757    PT Stop Time 0835    PT Time Calculation (min) 38 min    Activity Tolerance Patient tolerated treatment well    Behavior During Therapy [facility] for tasks assessed/performed             Past Medical History:  Diagnosis Date   GERD (gastroesophageal reflux disease)    Hypercholesteremia    Hypertension    Hypothyroidism    Thyroid  disease    Past Surgical History:  Procedure Laterality Date   CATARACT EXTRACTION W/PHACO Left 10/20/2021   Procedure: CATARACT EXTRACTION PHACO AND INTRAOCULAR LENS PLACEMENT (IOC);  Surgeon: Harrie Agent, MD;  Location: [facility];  Service: Ophthalmology;  Laterality: Left;  CDE 9.12   CATARACT EXTRACTION W/PHACO Right 11/03/2021   Procedure: CATARACT EXTRACTION PHACO AND INTRAOCULAR LENS PLACEMENT (IOC);  Surgeon: Harrie Agent, MD;  Location: [facility];  Service: Ophthalmology;  Laterality: Right;  CDE: 6.72   COLONOSCOPY  07/28/04   Dr. Dickie   COLONOSCOPY  2011   Dr. Shaaron: anal canal hemorhoids, pancolonic diverticula, otherwise normal   COLONOSCOPY N/A 03/17/2015   DR. Rourk: diverticulosis. consider one last tcs in 5 years for prior h/o tubular adenoma.   ESOPHAGOGASTRODUODENOSCOPY (EGD) WITH PROPOFOL  N/A 06/06/2017   Dr. Shaaron: Slightly baggy, atonic tubular esophagus which otherwise appeared normal, widely patent.  Dilated for history of dysphagia.  Large hiatal hernia.   HEMORROIDECTOMY     MALONEY DILATION N/A 06/06/2017   Procedure: AGAPITO DILATION;  Surgeon: Shaaron Lamar HERO, MD;  Location: [facility];  Service: Endoscopy;  Laterality: N/A;   TUBAL LIGATION     Patient  Active Problem List   Diagnosis Date Noted   Essential hypertension 09/23/2018   Hiatal hernia 07/18/2017   FHx: esophageal cancer 05/29/2017   Gastroesophageal reflux disease with esophagitis 05/28/2017   Esophageal dysphagia 05/28/2017   Allergic rhinitis due to pollen 11/21/2016   Osteopenia 06/20/2016   Acquired hypothyroidism 06/19/2016   Pure hypercholesterolemia 06/19/2016   Diverticulosis of colon without hemorrhage    History of colonic polyps 03/04/2015    PCP: Trudy Vaughn FALCON, MD   REFERRING PROVIDER: Trudy Vaughn FALCON, MD   REFERRING DIAG: M25.511,G89.29,M25.512 (ICD-10-CM) - Chronic pain of both shoulders   Rationale for Evaluation and Treatment:  Rehabiliation  THERAPY DIAG:  Chronic right shoulder pain  Chronic left shoulder pain  Muscle weakness (generalized)  ONSET DATE: began around May 2025  SUBJECTIVE:  SUBJECTIVE STATEMENT: She says the pain is not bad today, feels her ROM is improving    PERTINENT HISTORY:  See above PMH  PAIN:  NPRS scale:not bad, did not provide # Pain location:bilat shoulders Pain description: intermittent since the injections, sharp twinge Aggravating factors: lifting and reaching,  Relieving factors: ice, does not like to take meds, shoulder brace   PRECAUTIONS: ,  None  RED FLAGS: None   WEIGHT BEARING RESTRICTIONS:  NO  FALLS:  Has patient fallen in last 6 months? No  PLOF:  Independent with basic ADLs  PATIENT GOALS:  Reduce pain  OBJECTIVE:  Note: Objective measures were completed at Evaluation unless otherwise noted.  DIAGNOSTIC FINDINGS:  Bilat shoulder XR Impression rotator cuff arthropathy   PATIENT SURVEYS:  Patient-Specific Activity Scoring Scheme  0 represents "unable to perform." 10 represents  "able to perform at prior level. 0 1 2 3 4 5 6 7 8 9  10 (Date and Score)   Activity Eval     1. Lift objects over shoulder 2                       Score 2/10    Total score = sum of the activity scores/number of activities Minimum detectable change (90%CI) for average score = 2 points Minimum detectable change (90%CI) for single activity score = 3 points     EDEMA:  No    Shoulder  PALPATION: Pain and tenderness at posterior RTC tendon, suprapsinatus  UPPER EXTREMITY ROM:  Active ROM/PROM Right eval Left eval  Shoulder flexion 105/115 105/115  Shoulder extension    Shoulder abduction 85/ 85/  Shoulder adduction    Shoulder extension    Shoulder internal rotation L4/ L3/  Shoulder external rotation To right ear/20 To C1/25  Elbow flexion    Elbow extension    Wrist flexion    Wrist extension    Wrist ulnar deviation    Wrist radial deviation    Wrist pronation    Wrist supination     (Blank rows = not tested)   UPPER EXTREMITY MMT:  MMT Right eval Left eval  Shoulder flexion 3 3  Shoulder extension    Shoulder abduction 3 3  Shoulder adduction    Shoulder extension    Shoulder internal rotation 3 3  Shoulder external rotation 2 2  Middle trapezius    Lower trapezius    Elbow flexion    Elbow extension    Wrist flexion    Wrist extension    Wrist ulnar deviation    Wrist radial deviation    Wrist pronation    Wrist supination    Grip strength 4 4   (Blank rows = not tested)                                                                                                                             TREATMENT DATE:  10/22/23 UBE L1  3 min fwd, 3 min backward Wall ladder AAROM flexion X 6 each side UE ranger X 10 flexion, X 10 circles CW,CCW, performed each side Pball roll up wall for shoulder flexion AAROM X 10 reps Seated shoulder ER table stretch 5 sec hold X 10 bilat Standing rows red 2X10 bilat Standing shoulder extensions red 2X10  bilat Standing chest press/forward reach red 2X 10 bilat Standing shoulder IR red 2X10 bilat with towel roll Standing shoulder ER red 2X10 bilat with towel roll Standing shoulder flexion red X 10 bilat Seated bilat shoulder flexion AAROM with cane X 10 reps Seated shoulder abduction AAROM with cane X 10 reps each side Seated ER AAROM with cane and towel roll X 10 reps each side  10/18/23 UBE L1 2.5 min fwd, 2.5 min backward Wall ladder AAROM flexion X 5 each side UE ranger X 10 flexion, X 10 circles CW,CCW, X 10 abduction, performed each side Pball roll up wall for shoulder flexion AAROM X 10 reps Seated shoulder ER table stretch 5 sec hold X 10 bilat Standing rows red 2X10 bilat Standing shoulder extensions red 2X10 bilat Standing chest press/forward reach red 2X 10 bilat Standing shoulder IR red 2X10 bilat with towel roll Standing shoulder ER red 2X10 bilat with towel roll  10/15/23 HEP review with education, cuing, demo provided UBE L1 2 min fwd, 2 min backward Wall ladder AAROM flexion X 5 each side UE ranger X 5 flexion, X 5 circles CW,CCW, X 5 abduction, performed each side Pball roll up wall for shoulder flexion AAROM X 10 reps Manual therapy for shoulder ER with overpressure to tolerance, performed bilat Shoulder ER AAROM seated with cane and towel roll X 10 reps bilat Shoulder ER AAROM table stretch X 5 bilat Standing ER AAROM doorway stretch X 5 bilat    PATIENT EDUCATION: Education details: HEP, PT plan of care, selfcare Person educated: Patient Education method: Explanation, Demonstration, Verbal cues, and Handouts Education comprehension: verbalized understanding, further education recommended   HOME EXERCISE PROGRAM: Access Code: [email or web address] URL: [email or web address] Date: 10/11/2023 Prepared by: Redell Moose  Exercises - Shoulder Flexion Overhead with Dowel  - 2-3 x daily - 6 x weekly - 1 sets - 10 reps - Standing Shoulder Abduction AAROM with  Dowel  - 2-3 x daily - 6 x weekly - 1 sets - 10 reps - Seated Shoulder External Rotation AAROM with Cane and Hand in Neutral  - 2-3 x daily - 6 x weekly - 1 sets - 10 reps - Standing Shoulder Row with Anchored Resistance  - 2 x daily - 6 x weekly - 2 sets - 10 reps - Chest Press with Resistance  - 2 x daily - 6 x weekly - 2 sets - 10-20 reps  ASSESSMENT:  CLINICAL IMPRESSION: ROM and pain are improving since starting PT. She remains compliant with HEP. I did progress her strength program some today with good overall tolerance noted.   OBJECTIVE IMPAIRMENTS: decreased activity tolerance for ADL's,  decreased ROM, decreased strength, impaired flexibility, impaired UE use, and pain.  ACTIVITY LIMITATIONS: lifting, carry, reaching, cleaning  PERSONAL FACTORS: see above PMH  also affecting patient's functional outcome.  REHAB POTENTIAL: Good  CLINICAL DECISION MAKING: Stable/uncomplicated  EVALUATION COMPLEXITY: Low    GOALS: Short term PT Goals Target date: 11/08/2023  Pt will be I and compliant with HEP. Baseline:  Goal status: MET 10/18/23 Pt will decrease pain by 25% overall with reaching Baseline: Goal status: ongoing 10/18/23  Long term PT  goals Target date:11/22/2023   Pt will improve bilat shouler AROM to [facility] >90 deg abd and > 110 flexion, and able to reach behind head to improve functional mobility Baseline: Goal status: ongoing 10/18/23 Pt will improve bilat shoulder strength to at least 4/5 MMT to improve functional strength Baseline: Goal status: ongoing 10/18/23 Pt will improve Patient specific functional scale (PSFS) to at least 6/10 to show improved function level Baseline:2/10 Goal status: ongoing 10/18/23 Pt will reduce pain to overall less than 3/10 with usual activity. Baseline: Goal status: ongoing 10/18/23  PLAN: PT FREQUENCY: 2-3 times per week   PT DURATION: 6 weeks  PLANNED INTERVENTIONS (unless contraindicated):  97110-Therapeutic exercises, 97530-  Therapeutic activity, [id number]- Neuromuscular re-education, 97535- Self Care, 02859- Manual therapy, G0283- Electrical stimulation (unattended), 97016- Vasopneumatic device, [id number]- Ultrasound, and 97033- Ionotophoresis 4mg /ml Dexamethasone   PLAN FOR NEXT SESSION: gentle ROM and strength that do not aggravate symtpoms.  NEXT MD VISIT: 12/30/23  Redell JONELLE Moose, PT,DPT 10/22/2023, 8:36 AM

## 2023-10-24 ENCOUNTER — Ambulatory Visit: Admitting: Physical Therapy

## 2023-10-24 ENCOUNTER — Encounter: Payer: Self-pay | Admitting: Physical Therapy

## 2023-10-24 DIAGNOSIS — M6281 Muscle weakness (generalized): Secondary | ICD-10-CM

## 2023-10-24 DIAGNOSIS — G8929 Other chronic pain: Secondary | ICD-10-CM

## 2023-10-24 DIAGNOSIS — M25511 Pain in right shoulder: Secondary | ICD-10-CM | POA: Diagnosis not present

## 2023-10-24 NOTE — Therapy (Signed)
 OUTPATIENT PHYSICAL THERAPY TREATMENT   Patient Name: Tabitha Fields MRN: [id number] DOB:10-16-1945, 78 y.o., female Today's Date: 10/24/2023  END OF SESSION:  PT End of Session - 10/24/23 0805     Visit Number 5    Number of Visits 12    Date for PT Re-Evaluation 11/22/23    Authorization Type MCR    Progress Note Due on Visit 10    PT Start Time 0758    PT Stop Time 0836    PT Time Calculation (min) 38 min    Activity Tolerance Patient tolerated treatment well    Behavior During Therapy [facility] for tasks assessed/performed              Past Medical History:  Diagnosis Date   GERD (gastroesophageal reflux disease)    Hypercholesteremia    Hypertension    Hypothyroidism    Thyroid  disease    Past Surgical History:  Procedure Laterality Date   CATARACT EXTRACTION W/PHACO Left 10/20/2021   Procedure: CATARACT EXTRACTION PHACO AND INTRAOCULAR LENS PLACEMENT (IOC);  Surgeon: Harrie Agent, MD;  Location: [facility];  Service: Ophthalmology;  Laterality: Left;  CDE 9.12   CATARACT EXTRACTION W/PHACO Right 11/03/2021   Procedure: CATARACT EXTRACTION PHACO AND INTRAOCULAR LENS PLACEMENT (IOC);  Surgeon: Harrie Agent, MD;  Location: [facility];  Service: Ophthalmology;  Laterality: Right;  CDE: 6.72   COLONOSCOPY  07/28/04   Dr. Dickie   COLONOSCOPY  2011   Dr. Shaaron: anal canal hemorhoids, pancolonic diverticula, otherwise normal   COLONOSCOPY N/A 03/17/2015   DR. Rourk: diverticulosis. consider one last tcs in 5 years for prior h/o tubular adenoma.   ESOPHAGOGASTRODUODENOSCOPY (EGD) WITH PROPOFOL  N/A 06/06/2017   Dr. Shaaron: Slightly baggy, atonic tubular esophagus which otherwise appeared normal, widely patent.  Dilated for history of dysphagia.  Large hiatal hernia.   HEMORROIDECTOMY     MALONEY DILATION N/A 06/06/2017   Procedure: AGAPITO DILATION;  Surgeon: Shaaron Lamar HERO, MD;  [facility];  Service: Endoscopy;  Laterality: N/A;   TUBAL LIGATION     Patient  Active Problem List   Diagnosis Date Noted   Essential hypertension 09/23/2018   Hiatal hernia 07/18/2017   FHx: esophageal cancer 05/29/2017   Gastroesophageal reflux disease with esophagitis 05/28/2017   Esophageal dysphagia 05/28/2017   Allergic rhinitis due to pollen 11/21/2016   Osteopenia 06/20/2016   Acquired hypothyroidism 06/19/2016   Pure hypercholesterolemia 06/19/2016   Diverticulosis of colon without hemorrhage    History of colonic polyps 03/04/2015    PCP: Trudy Vaughn FALCON, MD   REFERRING PROVIDER: Trudy Vaughn FALCON, MD   REFERRING DIAG: M25.511,G89.29,M25.512 (ICD-10-CM) - Chronic pain of both shoulders   Rationale for Evaluation and Treatment:  Rehabiliation  THERAPY DIAG:  Chronic right shoulder pain  Chronic left shoulder pain  Muscle weakness (generalized)  ONSET DATE: began around May 2025  SUBJECTIVE:  SUBJECTIVE STATEMENT: She can tell a difference with improvement with PT. She does still get twinges of pain in her right shoulder    PERTINENT HISTORY:  See above PMH  PAIN:  NPRS scale:3/10 Pain location:bilat shoulders Pain description: intermittent since the injections, sharp twinge Aggravating factors: lifting and reaching,  Relieving factors: ice, does not like to take meds, shoulder brace   PRECAUTIONS: ,  None  RED FLAGS: None   WEIGHT BEARING RESTRICTIONS:  NO  FALLS:  Has patient fallen in last 6 months? No  PLOF:  Independent with basic ADLs  PATIENT GOALS:  Reduce pain  OBJECTIVE:  Note: Objective measures were completed at Evaluation unless otherwise noted.  DIAGNOSTIC FINDINGS:  Bilat shoulder XR Impression rotator cuff arthropathy   PATIENT SURVEYS:  Patient-Specific Activity Scoring Scheme  0 represents "unable to  perform." 10 represents "able to perform at prior level. 0 1 2 3 4 5 6 7 8 9  10 (Date and Score)   Activity Eval     1. Lift objects over shoulder 2                       Score 2/10    Total score = sum of the activity scores/number of activities Minimum detectable change (90%CI) for average score = 2 points Minimum detectable change (90%CI) for single activity score = 3 points     EDEMA:  No    Shoulder  PALPATION: Pain and tenderness at posterior RTC tendon, suprapsinatus  UPPER EXTREMITY ROM:  Active ROM/PROM Right eval Left eval Right 10/24/23 Left 10/24/23  Shoulder flexion 105/115 105/115 145/ 140/  Shoulder extension      Shoulder abduction 85/ 85/ 130/ 120/  Shoulder adduction      Shoulder extension      Shoulder internal rotation L4/ L3/    Shoulder external rotation To right ear/20 To C1/25 To C3 reaching behind head To C3 reaching behind head  Elbow flexion      Elbow extension      Wrist flexion      Wrist extension      Wrist ulnar deviation      Wrist radial deviation      Wrist pronation      Wrist supination       (Blank rows = not tested)   UPPER EXTREMITY MMT:  MMT Right eval Left eval  Shoulder flexion 3 3  Shoulder extension    Shoulder abduction 3 3  Shoulder adduction    Shoulder extension    Shoulder internal rotation 3 3  Shoulder external rotation 2 2  Middle trapezius    Lower trapezius    Elbow flexion    Elbow extension    Wrist flexion    Wrist extension    Wrist ulnar deviation    Wrist radial deviation    Wrist pronation    Wrist supination    Grip strength 4 4   (Blank rows = not tested)  TREATMENT DATE:  10/24/23 Therex UBE L1 6 min Wall ladder AAROM flexion X 7 each side UE ranger X 10 flexion, X 10 abduction performed on both sides Pball roll up wall for shoulder flexion  AAROM X 10 reps Standing doorway ER stretch 5 sec hold X 10 bilat  Theractivity Standing rows green 2X10 bilat Standing shoulder green 2X10 bilat Standing chest press/forward reach green 2X 10 bilat Standing shoulder IR red 2X10 bilat with towel roll Standing shoulder ER red 2X10 bilat with towel roll Standing shoulder flexion 2# ball reaching into bottom shelf of top cabinet X 10 bilat then progressed to middle shelf X 5 bilat   10/22/23 UBE L1 3 min fwd, 3 min backward Wall ladder AAROM flexion X 6 each side UE ranger X 10 flexion, X 10 circles CW,CCW, performed each side Pball roll up wall for shoulder flexion AAROM X 10 reps Seated shoulder ER table stretch 5 sec hold X 10 bilat Standing rows red 2X10 bilat Standing shoulder extensions red 2X10 bilat Standing chest press/forward reach red 2X 10 bilat Standing shoulder IR red 2X10 bilat with towel roll Standing shoulder ER red 2X10 bilat with towel roll Standing shoulder flexion red X 10 bilat Seated bilat shoulder flexion AAROM with cane X 10 reps Seated shoulder abduction AAROM with cane X 10 reps each side Seated ER AAROM with cane and towel roll X 10 reps each side  10/18/23 UBE L1 2.5 min fwd, 2.5 min backward Wall ladder AAROM flexion X 5 each side UE ranger X 10 flexion, X 10 circles CW,CCW, X 10 abduction, performed each side Pball roll up wall for shoulder flexion AAROM X 10 reps Seated shoulder ER table stretch 5 sec hold X 10 bilat Standing rows red 2X10 bilat Standing shoulder extensions red 2X10 bilat Standing chest press/forward reach red 2X 10 bilat Standing shoulder IR red 2X10 bilat with towel roll Standing shoulder ER red 2X10 bilat with towel roll     PATIENT EDUCATION: Education details: HEP, PT plan of care, selfcare Person educated: Patient Education method: Explanation, Demonstration, Verbal cues, and Handouts Education comprehension: verbalized understanding, further education  recommended   HOME EXERCISE PROGRAM: Access Code: [email or web address] URL: [email or web address] Date: 10/11/2023 Prepared by: Redell Moose  Exercises - Shoulder Flexion Overhead with Dowel  - 2-3 x daily - 6 x weekly - 1 sets - 10 reps - Standing Shoulder Abduction AAROM with Dowel  - 2-3 x daily - 6 x weekly - 1 sets - 10 reps - Seated Shoulder External Rotation AAROM with Cane and Hand in Neutral  - 2-3 x daily - 6 x weekly - 1 sets - 10 reps - Standing Shoulder Row with Anchored Resistance  - 2 x daily - 6 x weekly - 2 sets - 10 reps - Chest Press with Resistance  - 2 x daily - 6 x weekly - 2 sets - 10-20 reps  ASSESSMENT:  CLINICAL IMPRESSION: ROM showed excellent progress with updated measurements. Her ability to reach above shoulder height has now greatly improved.  OBJECTIVE IMPAIRMENTS: decreased activity tolerance for ADL's,  decreased ROM, decreased strength, impaired flexibility, impaired UE use, and pain.  ACTIVITY LIMITATIONS: lifting, carry, reaching, cleaning  PERSONAL FACTORS: see above PMH  also affecting patient's functional outcome.  REHAB POTENTIAL: Good  CLINICAL DECISION MAKING: Stable/uncomplicated  EVALUATION COMPLEXITY: Low    GOALS: Short term PT Goals Target date: 11/08/2023  Pt will be I and compliant with HEP. Baseline:  Goal status: MET 10/18/23 Pt  will decrease pain by 25% overall with reaching Baseline: Goal status: MET 10/24/23  Long term PT goals Target date:11/22/2023   Pt will improve bilat shouler AROM to [facility] >90 deg abd and > 110 flexion, and able to reach behind head to improve functional mobility Baseline: Goal status: MET 10/24/23 Pt will improve bilat shoulder strength to at least 4/5 MMT to improve functional strength Baseline: Goal status: ongoing 10/24/23 Pt will improve Patient specific functional scale (PSFS) to at least 6/10 to show improved function level Baseline:2/10 Goal status: ongoing 10/24/23 Pt will reduce  pain to overall less than 3/10 with usual activity. Baseline: Goal status: ongoing 10/24/23  PLAN: PT FREQUENCY: 2-3 times per week   PT DURATION: 6 weeks  PLANNED INTERVENTIONS (unless contraindicated):  97110-Therapeutic exercises, 97530- Therapeutic activity, [id number]- Neuromuscular re-education, 97535- Self Care, 02859- Manual therapy, G0283- Electrical stimulation (unattended), 97016- Vasopneumatic device, [id number]- Ultrasound, and 97033- Ionotophoresis 4mg /ml Dexamethasone   PLAN FOR NEXT SESSION: gentle ROM and strength that do not aggravate symtpoms. She now has new PT referral for leg weakness so after we finish up PT for shoulders will start new episode of care for her legs.  NEXT MD VISIT: 12/30/23  Redell JONELLE Moose, PT,DPT 10/24/2023, 8:42 AM

## 2023-10-28 ENCOUNTER — Ambulatory Visit: Admitting: Physical Therapy

## 2023-10-28 ENCOUNTER — Encounter: Payer: Self-pay | Admitting: Physical Therapy

## 2023-10-28 DIAGNOSIS — G8929 Other chronic pain: Secondary | ICD-10-CM

## 2023-10-28 DIAGNOSIS — M25511 Pain in right shoulder: Secondary | ICD-10-CM | POA: Diagnosis not present

## 2023-10-28 DIAGNOSIS — M6281 Muscle weakness (generalized): Secondary | ICD-10-CM

## 2023-10-28 NOTE — Therapy (Signed)
 OUTPATIENT PHYSICAL THERAPY TREATMENT   Patient Name: Tabitha Fields MRN: [id number] DOB:[birth year], 78 y.o., female Today's Date: 10/28/2023  END OF SESSION:  PT End of Session - 10/28/23 0803     Visit Number 6    Number of Visits 12    Date for PT Re-Evaluation 11/22/23    Authorization Type MCR    Progress Note Due on Visit 10    PT Start Time 0758    PT Stop Time 0836    PT Time Calculation (min) 38 min    Activity Tolerance Patient tolerated treatment well    Behavior During Therapy [facility] for tasks assessed/performed              Past Medical History:  Diagnosis Date   GERD (gastroesophageal reflux disease)    Hypercholesteremia    Hypertension    Hypothyroidism    Thyroid  disease    Past Surgical History:  Procedure Laterality Date   CATARACT EXTRACTION W/PHACO Left 10/20/2021   Procedure: CATARACT EXTRACTION PHACO AND INTRAOCULAR LENS PLACEMENT (IOC);  Surgeon: Harrie Agent, MD;  Location: [facility];  Service: Ophthalmology;  Laterality: Left;  CDE 9.12   CATARACT EXTRACTION W/PHACO Right 11/03/2021   Procedure: CATARACT EXTRACTION PHACO AND INTRAOCULAR LENS PLACEMENT (IOC);  Surgeon: Harrie Agent, MD;  Location: [facility];  Service: Ophthalmology;  Laterality: Right;  CDE: 6.72   COLONOSCOPY  07/28/04   Dr. Dickie   COLONOSCOPY  2011   Dr. Shaaron: anal canal hemorhoids, pancolonic diverticula, otherwise normal   COLONOSCOPY N/A 03/17/2015   DR. Rourk: diverticulosis. consider one last tcs in 5 years for prior h/o tubular adenoma.   ESOPHAGOGASTRODUODENOSCOPY (EGD) WITH PROPOFOL  N/A 06/06/2017   Dr. Shaaron: Slightly baggy, atonic tubular esophagus which otherwise appeared normal, widely patent.  Dilated for history of dysphagia.  Large hiatal hernia.   HEMORROIDECTOMY     MALONEY DILATION N/A 06/06/2017   Procedure: AGAPITO DILATION;  Surgeon: Shaaron Lamar HERO, MD;  Location: [facility];  Service: Endoscopy;  Laterality: N/A;   TUBAL LIGATION     Patient  Active Problem List   Diagnosis Date Noted   Essential hypertension 09/23/2018   Hiatal hernia 07/18/2017   FHx: esophageal cancer 05/29/2017   Gastroesophageal reflux disease with esophagitis 05/28/2017   Esophageal dysphagia 05/28/2017   Allergic rhinitis due to pollen 11/21/2016   Osteopenia 06/20/2016   Acquired hypothyroidism 06/19/2016   Pure hypercholesterolemia 06/19/2016   Diverticulosis of colon without hemorrhage    History of colonic polyps 03/04/2015    PCP: Trudy Vaughn FALCON, MD   REFERRING PROVIDER: Trudy Vaughn FALCON, MD   REFERRING DIAG: M25.511,G89.29,M25.512 (ICD-10-CM) - Chronic pain of both shoulders   Rationale for Evaluation and Treatment:  Rehabiliation  THERAPY DIAG:  Chronic right shoulder pain  Chronic left shoulder pain  Muscle weakness (generalized)  ONSET DATE: began around May 2025  SUBJECTIVE:  SUBJECTIVE STATEMENT: She says pain is not bad, does get some twinges of pain in her right shoulder at times    PERTINENT HISTORY:  See above PMH  PAIN:  NPRS scale:3/10 Pain location:bilat shoulders Pain description: intermittent since the injections, sharp twinge Aggravating factors: lifting and reaching,  Relieving factors: ice, does not like to take meds, shoulder brace   PRECAUTIONS: ,  None  RED FLAGS: None   WEIGHT BEARING RESTRICTIONS:  NO  FALLS:  Has patient fallen in last 6 months? No  PLOF:  Independent with basic ADLs  PATIENT GOALS:  Reduce pain  OBJECTIVE:  Note: Objective measures were completed at Evaluation unless otherwise noted.  DIAGNOSTIC FINDINGS:  Bilat shoulder XR Impression rotator cuff arthropathy   PATIENT SURVEYS:  Patient-Specific Activity Scoring Scheme  0 represents "unable to perform." 10 represents  "able to perform at prior level. 0 1 2 3 4 5 6 7 8 9  10 (Date and Score)   Activity Eval   10/28/23  1. Lift objects over shoulder 2  8                     Score 2/10 8/10   Total score = sum of the activity scores/number of activities Minimum detectable change (90%CI) for average score = 2 points Minimum detectable change (90%CI) for single activity score = 3 points     EDEMA:  No    Shoulder  PALPATION: Pain and tenderness at posterior RTC tendon, suprapsinatus  UPPER EXTREMITY ROM:  Active ROM/PROM Right eval Left eval Right 10/24/23 Left 10/24/23  Shoulder flexion 105/115 105/115 145/ 140/  Shoulder extension      Shoulder abduction 85/ 85/ 130/ 120/  Shoulder adduction      Shoulder extension      Shoulder internal rotation L4/ L3/    Shoulder external rotation To right ear/20 To C1/25 To C3 reaching behind head To C3 reaching behind head  Elbow flexion      Elbow extension      Wrist flexion      Wrist extension      Wrist ulnar deviation      Wrist radial deviation      Wrist pronation      Wrist supination       (Blank rows = not tested)   UPPER EXTREMITY MMT:  MMT Right eval Left eval Right 10/28/23 Left 10/28/23  Shoulder flexion 3 3 4 4   Shoulder extension      Shoulder abduction 3 3    Shoulder adduction      Shoulder extension      Shoulder internal rotation 3 3 4 4   Shoulder external rotation 2 2 4 4   Middle trapezius      Lower trapezius      Elbow flexion      Elbow extension      Wrist flexion      Wrist extension      Wrist ulnar deviation      Wrist radial deviation      Wrist pronation      Wrist supination      Grip strength 4 4     (Blank rows = not tested)  TREATMENT DATE:  10/28/23 Therex UBE L1 6 min Wall ladder AAROM flexion X 7 each side UE ranger X 10 flexion, X 10 abduction performed on  both sides Pball roll up wall for shoulder flexion AAROM X 10 reps Standing doorway ER stretch 5 sec hold X 10 bilat  Theractivity Standing rows green 2X10 bilat Standing shoulder green 2X10 bilat Standing chest press/forward reach green 2X 10 bilat Standing shoulder IR red 2X10 bilat with towel roll Standing shoulder ER red 2X10 bilat with towel roll Standing shoulder flexion 2# ball reaching into bottom shelf of top cabinet X 10 bilat then progressed to middle shelf X 5 bilat  10/24/23 Therex UBE L1 6 min Wall ladder AAROM flexion X 7 each side UE ranger X 10 flexion, X 10 abduction performed on both sides Pball roll up wall for shoulder flexion AAROM X 10 reps Standing doorway ER stretch 5 sec hold X 10 bilat  Theractivity Standing rows green 2X10 bilat Standing shoulder green 2X10 bilat Standing chest press/forward reach green 2X 10 bilat Standing shoulder IR red 2X10 bilat with towel roll Standing shoulder ER red 2X10 bilat with towel roll Standing shoulder flexion 2# ball reaching into bottom shelf of top cabinet X 10 bilat then progressed to middle shelf X 5 bilat   10/22/23 UBE L1 3 min fwd, 3 min backward Wall ladder AAROM flexion X 6 each side UE ranger X 10 flexion, X 10 circles CW,CCW, performed each side Pball roll up wall for shoulder flexion AAROM X 10 reps Seated shoulder ER table stretch 5 sec hold X 10 bilat Standing rows red 2X10 bilat Standing shoulder extensions red 2X10 bilat Standing chest press/forward reach red 2X 10 bilat Standing shoulder IR red 2X10 bilat with towel roll Standing shoulder ER red 2X10 bilat with towel roll Standing shoulder flexion red X 10 bilat Seated bilat shoulder flexion AAROM with cane X 10 reps Seated shoulder abduction AAROM with cane X 10 reps each side Seated ER AAROM with cane and towel roll X 10 reps each side  10/18/23 UBE L1 2.5 min fwd, 2.5 min backward Wall ladder AAROM flexion X 5 each side UE ranger X 10  flexion, X 10 circles CW,CCW, X 10 abduction, performed each side Pball roll up wall for shoulder flexion AAROM X 10 reps Seated shoulder ER table stretch 5 sec hold X 10 bilat Standing rows red 2X10 bilat Standing shoulder extensions red 2X10 bilat Standing chest press/forward reach red 2X 10 bilat Standing shoulder IR red 2X10 bilat with towel roll Standing shoulder ER red 2X10 bilat with towel roll     PATIENT EDUCATION: Education details: HEP, PT plan of care, selfcare Person educated: Patient Education method: Explanation, Demonstration, Verbal cues, and Handouts Education comprehension: verbalized understanding, further education recommended   HOME EXERCISE PROGRAM: Access Code: [email or web address] URL: [email or web address] Date: 10/11/2023 Prepared by: Redell Moose  Exercises - Shoulder Flexion Overhead with Dowel  - 2-3 x daily - 6 x weekly - 1 sets - 10 reps - Standing Shoulder Abduction AAROM with Dowel  - 2-3 x daily - 6 x weekly - 1 sets - 10 reps - Seated Shoulder External Rotation AAROM with Cane and Hand in Neutral  - 2-3 x daily - 6 x weekly - 1 sets - 10 reps - Standing Shoulder Row with Anchored Resistance  - 2 x daily - 6 x weekly - 2 sets - 10 reps - Chest Press with Resistance  - 2 x daily - 6 x  weekly - 2 sets - 10-20 reps  ASSESSMENT:  CLINICAL IMPRESSION: She continues to make good progress with her shoulder rehab. She also has new referral for PT for her legs so she is interested in finishing PT soon for her shoulders to begin with her legs.   OBJECTIVE IMPAIRMENTS: decreased activity tolerance for ADL's,  decreased ROM, decreased strength, impaired flexibility, impaired UE use, and pain.  ACTIVITY LIMITATIONS: lifting, carry, reaching, cleaning  PERSONAL FACTORS: see above PMH  also affecting patient's functional outcome.  REHAB POTENTIAL: Good  CLINICAL DECISION MAKING: Stable/uncomplicated  EVALUATION COMPLEXITY: Low    GOALS: Short  term PT Goals Target date: 11/08/2023  Pt will be I and compliant with HEP. Baseline:  Goal status: MET 10/18/23 Pt will decrease pain by 25% overall with reaching Baseline: Goal status: MET 10/24/23  Long term PT goals Target date:11/22/2023   Pt will improve bilat shouler AROM to [facility] >90 deg abd and > 110 flexion, and able to reach behind head to improve functional mobility Baseline: Goal status: MET 10/24/23 Pt will improve bilat shoulder strength to at least 4/5 MMT to improve functional strength Baseline: Goal status: MET 10/28/22 Pt will improve Patient specific functional scale (PSFS) to at least 6/10 to show improved function level Baseline:2/10 Goal status: MET  10/28/23 Pt will reduce pain to overall less than 3/10 with usual activity. Baseline: Goal status: ongoing 10/24/23  PLAN: PT FREQUENCY: 2-3 times per week   PT DURATION: 6 weeks  PLANNED INTERVENTIONS (unless contraindicated):  97110-Therapeutic exercises, 97530- Therapeutic activity, [id number]- Neuromuscular re-education, 97535- Self Care, 02859- Manual therapy, G0283- Electrical stimulation (unattended), 97016- Vasopneumatic device, [id number]- Ultrasound, and 97033- Ionotophoresis 4mg /ml Dexamethasone   PLAN FOR NEXT SESSION: gentle ROM and strength that do not aggravate symtpoms. She now has new PT referral for leg weakness so after we finish up PT for shoulders will start new episode of care for her legs.  NEXT MD VISIT: 12/30/23  Redell JONELLE Moose, PT,DPT 10/28/2023, 9:05 AM

## 2023-10-31 ENCOUNTER — Ambulatory Visit: Admitting: Physical Therapy

## 2023-10-31 ENCOUNTER — Encounter: Payer: Self-pay | Admitting: Physical Therapy

## 2023-10-31 DIAGNOSIS — M6281 Muscle weakness (generalized): Secondary | ICD-10-CM

## 2023-10-31 DIAGNOSIS — M25511 Pain in right shoulder: Secondary | ICD-10-CM | POA: Diagnosis not present

## 2023-10-31 DIAGNOSIS — G8929 Other chronic pain: Secondary | ICD-10-CM

## 2023-10-31 NOTE — Therapy (Signed)
 OUTPATIENT PHYSICAL THERAPY TREATMENT/Discharge PHYSICAL THERAPY DISCHARGE SUMMARY  Visits from Start of Care: 7  Current functional level related to goals / functional outcomes: See below   Remaining deficits: See below   Education / Equipment: HEP  Plan:  Patient goals were met. Patient is being discharged due to meeting PT goals. She will have new PT evaluation next week for her legs as she has new PT referral from her doctor for this.       Patient Name: Tabitha Fields MRN: [id number] DOB:[birth year], 78 y.o., female Today's Date: 10/31/2023  END OF SESSION:  PT End of Session - 10/31/23 0805     Visit Number 7    Number of Visits 12    Date for PT Re-Evaluation 11/22/23    Authorization Type MCR    Progress Note Due on Visit 10    PT Start Time 0757    PT Stop Time 0835    PT Time Calculation (min) 38 min    Activity Tolerance Patient tolerated treatment well    Behavior During Therapy [facility] for tasks assessed/performed               Past Medical History:  Diagnosis Date   GERD (gastroesophageal reflux disease)    Hypercholesteremia    Hypertension    Hypothyroidism    Thyroid  disease    Past Surgical History:  Procedure Laterality Date   CATARACT EXTRACTION W/PHACO Left 10/20/2021   Procedure: CATARACT EXTRACTION PHACO AND INTRAOCULAR LENS PLACEMENT (IOC);  Surgeon: Harrie Agent, MD;  Location: [facility];  Service: Ophthalmology;  Laterality: Left;  CDE 9.12   CATARACT EXTRACTION W/PHACO Right 11/03/2021   Procedure: CATARACT EXTRACTION PHACO AND INTRAOCULAR LENS PLACEMENT (IOC);  Surgeon: Harrie Agent, MD;  Location: [facility];  Service: Ophthalmology;  Laterality: Right;  CDE: 6.72   COLONOSCOPY  07/28/04   Dr. Dickie   COLONOSCOPY  2011   Dr. Shaaron: anal canal hemorhoids, pancolonic diverticula, otherwise normal   COLONOSCOPY N/A 03/17/2015   DR. Rourk: diverticulosis. consider one last tcs in 5 years for prior h/o tubular adenoma.    ESOPHAGOGASTRODUODENOSCOPY (EGD) WITH PROPOFOL  N/A 06/06/2017   Dr. Shaaron: Slightly baggy, atonic tubular esophagus which otherwise appeared normal, widely patent.  Dilated for history of dysphagia.  Large hiatal hernia.   HEMORROIDECTOMY     MALONEY DILATION N/A 06/06/2017   Procedure: AGAPITO DILATION;  Surgeon: Shaaron Lamar HERO, MD;  Location: [facility];  Service: Endoscopy;  Laterality: N/A;   TUBAL LIGATION     Patient Active Problem List   Diagnosis Date Noted   Essential hypertension 09/23/2018   Hiatal hernia 07/18/2017   FHx: esophageal cancer 05/29/2017   Gastroesophageal reflux disease with esophagitis 05/28/2017   Esophageal dysphagia 05/28/2017   Allergic rhinitis due to pollen 11/21/2016   Osteopenia 06/20/2016   Acquired hypothyroidism 06/19/2016   Pure hypercholesterolemia 06/19/2016   Diverticulosis of colon without hemorrhage    History of colonic polyps 03/04/2015    PCP: Trudy Vaughn FALCON, MD   REFERRING PROVIDER: Trudy Vaughn FALCON, MD   REFERRING DIAG: M25.511,G89.29,M25.512 (ICD-10-CM) - Chronic pain of both shoulders   Rationale for Evaluation and Treatment:  Rehabiliation  THERAPY DIAG:  Chronic right shoulder pain  Chronic left shoulder pain  Muscle weakness (generalized)  ONSET DATE: began around May 2025  SUBJECTIVE:  SUBJECTIVE STATEMENT: She says pain is not bad, does get some twinges of pain in her right shoulder at times    PERTINENT HISTORY:  See above PMH  PAIN:  NPRS scale:2/10 Pain location:bilat shoulders Pain description: intermittent since the injections, sharp twinge Aggravating factors: lifting and reaching,  Relieving factors: ice, does not like to take meds, shoulder brace   PRECAUTIONS: ,  None  RED FLAGS: None   WEIGHT BEARING  RESTRICTIONS:  NO  FALLS:  Has patient fallen in last 6 months? No  PLOF:  Independent with basic ADLs  PATIENT GOALS:  Reduce pain  OBJECTIVE:  Note: Objective measures were completed at Evaluation unless otherwise noted.  DIAGNOSTIC FINDINGS:  Bilat shoulder XR Impression rotator cuff arthropathy   PATIENT SURVEYS:  Patient-Specific Activity Scoring Scheme  0 represents "unable to perform." 10 represents "able to perform at prior level. 0 1 2 3 4 5 6 7 8 9  10 (Date and Score)   Activity Eval   10/28/23  1. Lift objects over shoulder 2  8                     Score 2/10 8/10   Total score = sum of the activity scores/number of activities Minimum detectable change (90%CI) for average score = 2 points Minimum detectable change (90%CI) for single activity score = 3 points     EDEMA:  No    Shoulder  PALPATION: Pain and tenderness at posterior RTC tendon, suprapsinatus  UPPER EXTREMITY ROM:  Active ROM/PROM Right eval Left eval Right 10/24/23 Left 10/24/23 Right 7/31/125 Left 10/31/23  Shoulder flexion 105/115 105/115 145/ 140/ 145/ 145/  Shoulder extension        Shoulder abduction 85/ 85/ 130/ 120/ 130/ 130/  Shoulder adduction        Shoulder extension        Shoulder internal rotation L4/ L3/      Shoulder external rotation To right ear/20 To C1/25 To C3 reaching behind head To C3 reaching behind head TO C5 reaching behind head To C5 reaching behind head  Elbow flexion        Elbow extension        Wrist flexion        Wrist extension        Wrist ulnar deviation        Wrist radial deviation        Wrist pronation        Wrist supination         (Blank rows = not tested)   UPPER EXTREMITY MMT:  MMT Right eval Left eval Right 10/28/23 Left 10/28/23 Right 10/31/23  Left 10/31/23   Shoulder flexion 3 3 4 4  4+ 4+  Shoulder extension        Shoulder abduction 3 3   4+ 4+  Shoulder adduction        Shoulder extension        Shoulder  internal rotation 3 3 4 4  4+ 4+  Shoulder external rotation 2 2 4 4  4+ 4+  Middle trapezius        Lower trapezius        Elbow flexion        Elbow extension        Wrist flexion        Wrist extension        Wrist ulnar deviation        Wrist radial deviation  Wrist pronation        Wrist supination        Grip strength 4 4       (Blank rows = not tested)                                                                                                                             TREATMENT DATE:  10/31/23 Therex UBE L1 6 min Wall ladder AAROM flexion X 8 each side UE ranger X 10 flexion, X 10 abduction performed on both sides Pball roll up wall for shoulder flexion AAROM X 10 reps Standing doorway ER stretch 5 sec hold X 10 bilat HEP revisions and education provided  Theractivity Standing rows green 2X10 bilat Standing shoulder green 2X10 bilat Standing chest press/forward reach green 2X 10 bilat Standing shoulder IR red 2X10 bilat with towel roll Standing shoulder ER red 2X10 bilat with towel roll Standing shoulder flexion 2# ball reaching into middle shelf of top cabinet X 10 bilat  Standing shoulder flexion 2# X 10 bilat Standing shoulder abduction 2# 2X10     PATIENT EDUCATION: Education details: HEP, PT plan of care, selfcare Person educated: Patient Education method: Explanation, Demonstration, Verbal cues, and Handouts Education comprehension: verbalized understanding, further education recommended   HOME EXERCISE PROGRAM: Access Code: [email or web address] URL: [email or web address] Date: 10/31/2023 Prepared by: Redell Moose  Exercises - shoulder ER stretch in doorway  - 1 x daily - 3 x weekly - 1 sets - 10 reps - 5 sec hold - Shoulder Flexion Overhead with Dowel  - 1 x daily - 3 x weekly - 1 sets - 10 reps - Standing Shoulder Abduction AAROM with Dowel  - 1 x daily - 3 x weekly - 1 sets - 10 reps - Standing Shoulder Row with Anchored Resistance   - 1 x daily - 3 x weekly - 1 sets - 10 reps - Shoulder extension with resistance - Neutral  - 1 x daily - 3 x weekly - 1 sets - 10 reps - Chest Press with Resistance  - 1 x daily - 3 x weekly - 1 sets - Shoulder External Rotation with Anchored Resistance  - 1 x daily - 3 x weekly - 1 sets - 10 reps - Standing Shoulder Flexion to 90 Degrees with Dumbbells  - 1 x daily - 3 x weekly - 1 sets - 10 reps - Shoulder Abduction with Dumbbells - Thumbs Up  - 1 x daily - 3 x weekly - 1 sets - 10 reps ASSESSMENT:  CLINICAL IMPRESSION:  Patient goals were met. Patient is being discharged due to meeting PT goals and she will continue HEP at home 3 times per week for maintenance.  She will have new PT evaluation next week for her legs as she has new PT referral from her doctor for this.   OBJECTIVE IMPAIRMENTS: decreased activity tolerance for ADL's,  decreased ROM, decreased strength, impaired flexibility, impaired UE use,  and pain.  ACTIVITY LIMITATIONS: lifting, carry, reaching, cleaning  PERSONAL FACTORS: see above PMH  also affecting patient's functional outcome.  REHAB POTENTIAL: Good  CLINICAL DECISION MAKING: Stable/uncomplicated  EVALUATION COMPLEXITY: Low    GOALS: Short term PT Goals Target date: 11/08/2023  Pt will be I and compliant with HEP. Baseline:  Goal status: MET 10/18/23 Pt will decrease pain by 25% overall with reaching Baseline: Goal status: MET 10/24/23  Long term PT goals Target date:11/22/2023   Pt will improve bilat shouler AROM to [facility] >90 deg abd and > 110 flexion, and able to reach behind head to improve functional mobility Baseline: Goal status: MET 10/24/23 Pt will improve bilat shoulder strength to at least 4/5 MMT to improve functional strength Baseline: Goal status: MET 10/28/22 Pt will improve Patient specific functional scale (PSFS) to at least 6/10 to show improved function level Baseline:2/10 Goal status: MET  10/28/23 Pt will reduce pain to overall less  than 3/10 with usual activity. Baseline: Goal status: MET 10/31/23   PLAN: PT FREQUENCY: 2-3 times per week   PT DURATION: 6 weeks  PLANNED INTERVENTIONS (unless contraindicated):  97110-Therapeutic exercises, 97530- Therapeutic activity, [id number]- Neuromuscular re-education, 97535- Self Care, 02859- Manual therapy, G0283- Electrical stimulation (unattended), 97016- Vasopneumatic device, [id number]- Ultrasound, and 02966- Ionotophoresis 4mg /ml Dexamethasone   PLAN FOR NEXT SESSION:  She now has new PT referral for leg weakness so after we finish up PT for shoulders will start new episode of care for her legs.  NEXT MD VISIT: 12/30/23  Redell JONELLE Moose, PT,DPT 10/31/2023, 8:39 AM

## 2023-11-04 ENCOUNTER — Ambulatory Visit: Admitting: Physical Therapy

## 2023-11-04 ENCOUNTER — Encounter: Payer: Self-pay | Admitting: Physical Therapy

## 2023-11-04 ENCOUNTER — Ambulatory Visit: Attending: Orthopedic Surgery | Admitting: Physical Therapy

## 2023-11-04 DIAGNOSIS — M6281 Muscle weakness (generalized): Secondary | ICD-10-CM | POA: Diagnosis present

## 2023-11-04 DIAGNOSIS — R262 Difficulty in walking, not elsewhere classified: Secondary | ICD-10-CM | POA: Diagnosis present

## 2023-11-04 NOTE — Therapy (Signed)
 OUTPATIENT PHYSICAL THERAPY EVALUATION   Patient Name: Tabitha Fields MRN: [id number] DOB:[birth year], 78 y.o., female Today's Date: 11/04/2023  END OF SESSION:  PT End of Session - 11/04/23 0819     Visit Number 1    Number of Visits 6    Date for PT Re-Evaluation 12/16/23    Authorization Type MCR    Progress Note Due on Visit 10    PT Start Time 0756    PT Stop Time 0830    PT Time Calculation (min) 34 min    Activity Tolerance Patient tolerated treatment well    Behavior During Therapy [facility] for tasks assessed/performed          Past Medical History:  Diagnosis Date   GERD (gastroesophageal reflux disease)    Hypercholesteremia    Hypertension    Hypothyroidism    Thyroid  disease    Past Surgical History:  Procedure Laterality Date   CATARACT EXTRACTION W/PHACO Left 10/20/2021   Procedure: CATARACT EXTRACTION PHACO AND INTRAOCULAR LENS PLACEMENT (IOC);  Surgeon: Harrie Agent, MD;  Location: [facility];  Service: Ophthalmology;  Laterality: Left;  CDE 9.12   CATARACT EXTRACTION W/PHACO Right 11/03/2021   Procedure: CATARACT EXTRACTION PHACO AND INTRAOCULAR LENS PLACEMENT (IOC);  Surgeon: Harrie Agent, MD;  Location: [facility];  Service: Ophthalmology;  Laterality: Right;  CDE: 6.72   COLONOSCOPY  07/28/04   Dr. Dickie   COLONOSCOPY  2011   Dr. Shaaron: anal canal hemorhoids, pancolonic diverticula, otherwise normal   COLONOSCOPY N/A 03/17/2015   DR. Rourk: diverticulosis. consider one last tcs in 5 years for prior h/o tubular adenoma.   ESOPHAGOGASTRODUODENOSCOPY (EGD) WITH PROPOFOL  N/A 06/06/2017   Dr. Shaaron: Slightly baggy, atonic tubular esophagus which otherwise appeared normal, widely patent.  Dilated for history of dysphagia.  Large hiatal hernia.   HEMORROIDECTOMY     MALONEY DILATION N/A 06/06/2017   Procedure: AGAPITO DILATION;  Surgeon: Shaaron Lamar HERO, MD;  [facility]: [facility];  Service: Endoscopy;  Laterality: N/A;   TUBAL LIGATION     Patient Active  Problem List   Diagnosis Date Noted   Essential hypertension 09/23/2018   Hiatal hernia 07/18/2017   FHx: esophageal cancer 05/29/2017   Gastroesophageal reflux disease with esophagitis 05/28/2017   Esophageal dysphagia 05/28/2017   Allergic rhinitis due to pollen 11/21/2016   Osteopenia 06/20/2016   Acquired hypothyroidism 06/19/2016   Pure hypercholesterolemia 06/19/2016   Diverticulosis of colon without hemorrhage    History of colonic polyps 03/04/2015    PCP: Trudy Vaughn FALCON, MD   REFERRING PROVIDER:   Trudy Vaughn FALCON, MD    REFERRING DIAG: Weakness of legs   Rationale for Evaluation and Treatment:  Rehabiliation  THERAPY DIAG:  Muscle weakness (generalized)  Difficulty in walking, not elsewhere classified  ONSET DATE: 2 years of inactivity   SUBJECTIVE:  SUBJECTIVE STATEMENT: She reports knee problems and inactivity with leg weakness. She was active with her sister who passed 2 years ago and since then has been inactive.  PERTINENT HISTORY:  See above PMH  PAIN:  NPRS scale: 6/10 fatigue vs pain Pain location:knees and legs Pain description: intermittent Aggravating factors: activity such as walking up hill Relieving factors: rest   PRECAUTIONS: ,  None  RED FLAGS: None   WEIGHT BEARING RESTRICTIONS:  No  FALLS:  Has patient fallen in last 6 months? No   PLOF:  Independent with basic ADLs  PATIENT GOALS:  Improve activity level and strength  OBJECTIVE:  Note: Objective measures were completed at Evaluation unless otherwise noted.  PATIENT SURVEYS:  Patient-Specific Activity Scoring Scheme  0 represents "unable to perform." 10 represents "able to perform at prior level. 0 1 2 3 4 5 6 7 8 9  10 (Date and Score)   Activity Eval     1. Walking  up hill  5    2. Getting up from the floor  3    3.     4.    5.    Score 4/10    Total score = sum of the activity scores/number of activities Minimum detectable change (90%CI) for average score = 2 points Minimum detectable change (90%CI) for single activity score = 3 points   GAIT: Assistive device utilized: None Level of assistance: Complete Independence Comments: limited distances due to fatigue    LOWER EXTREMITY ROM:   WFL grossly bilat     LOWER EXTREMITY MMT:    MMT in sitting Right eval Left eval  Hip flexion 4 3+  Hip extension    Hip abduction 4 4  Hip adduction 4 4  Hip internal rotation    Hip external rotation    Knee flexion 4 4  Knee extension 4 4  Ankle dorsiflexion    Ankle plantarflexion    Ankle inversion    Ankle eversion     (Blank rows = not tested)  FUNCTIONAL TESTS:  Eval: 30 seconds chair stand test: 11 reps without UE support (Age related norm is 10-15 reps)                                                                                                                               TREATMENT DATE:  Eval HEP creation and review with demonstration and trial set preformed, see below for details Selfcare:endurance program of HEP and walking program. Benefits of joining gym and what [employer] has to offer in terms of coaching, program design, group exercise classes and exercise specialists that are available.    PATIENT EDUCATION: Education details: HEP, PT plan of care, selfcare Person educated: Patient Education method: Explanation, Demonstration, Verbal cues, and Handouts Education comprehension: verbalized understanding, further education recommended   HOME EXERCISE PROGRAM: Access Code: [email or web address] URL: [email or web address] Date: 11/04/2023 Prepared by: Redell Moose  Exercises - Mini Squat with Counter  Support  - 1 x daily - 6 x weekly - 2 sets - 10 reps - Seated Straight Leg Raise with Quad Contraction  - 1  x daily - 6 x weekly - 3 sets - 10 reps - Standing Alternating Partial Lunge  - 1 x daily - 6 x weekly - 2 sets - 10 reps  ASSESSMENT:  CLINICAL IMPRESSION: Patient referred to PT for bilateral leg weakness. Patient will benefit from skilled PT to improve overall function and to improve leg strength and endurance to improve abilities with ADL's.   OBJECTIVE IMPAIRMENTS: decreased activity tolerance for ADL's, difficulty walking, decreased balance, decreased endurance, decreased mobility, decreased ROM, decreased strength, impaired flexibility, impaired LE use, and pain.  ACTIVITY LIMITATIONS: bending, liftting, walking, standing, cleaning, community activity,   PERSONAL FACTORS: see above PMH  also affecting patient's functional outcome.  REHAB POTENTIAL: Good  CLINICAL DECISION MAKING: Stable/uncomplicated  EVALUATION COMPLEXITY: Low    GOALS: Short term PT Goals Target date: 12/02/2023   Pt will be I and compliant with HEP. Baseline:  Goal status: New Pt will improve 30 second chair test time to at least 14 reps Baseline: Goal status: New  Long term PT goals Target date:12/16/2023   Pt will improve 30 second chair stand test to at least 16 reps Baseline: Goal status: New Pt will improve  strength to at least 4+/5 MMT to improve functional strength Baseline: Goal status: New Pt will improve Patient specific functional scale (PSFS) to at least 6/10 to show improved function level Baseline: Goal status: New Pt will be able to ambulate community distances at least 500 ft WNL gait pattern without complaints Baseline: Goal status: New  PLAN: PT FREQUENCY: 1-2 times per week   PT DURATION: 6- weeks  PLANNED INTERVENTIONS  97110-Therapeutic exercises, 97530- Therapeutic activity, [id number]- Neuromuscular re-education, 97535- Self Care, 02859- Manual therapy, and 97116- Gait training  PLAN FOR NEXT SESSION: leg strength and endurance focus  Redell JONELLE Moose,  PT,DPT 11/04/2023, 8:39 AM

## 2023-11-06 ENCOUNTER — Ambulatory Visit: Admitting: Physical Therapy

## 2023-11-06 ENCOUNTER — Encounter: Payer: Self-pay | Admitting: Physical Therapy

## 2023-11-06 DIAGNOSIS — M6281 Muscle weakness (generalized): Secondary | ICD-10-CM

## 2023-11-06 DIAGNOSIS — R262 Difficulty in walking, not elsewhere classified: Secondary | ICD-10-CM

## 2023-11-06 NOTE — Therapy (Signed)
 OUTPATIENT PHYSICAL THERAPY EVALUATION   Patient Name: Tabitha Fields MRN: [id number] DOB:1945-05-26, 78 y.o., female Today's Date: 11/06/2023  END OF SESSION:  PT End of Session - 11/06/23 0800     Visit Number 2    Number of Visits 6    Date for PT Re-Evaluation 12/16/23    Authorization Type MCR    Progress Note Due on Visit 10    PT Start Time 0755    PT Stop Time 0833    PT Time Calculation (min) 38 min    Activity Tolerance Patient tolerated treatment well    Behavior During Therapy [facility] for tasks assessed/performed          Past Medical History:  Diagnosis Date   GERD (gastroesophageal reflux disease)    Hypercholesteremia    Hypertension    Hypothyroidism    Thyroid  disease    Past Surgical History:  Procedure Laterality Date   CATARACT EXTRACTION W/PHACO Left 10/20/2021   Procedure: CATARACT EXTRACTION PHACO AND INTRAOCULAR LENS PLACEMENT (IOC);  Surgeon: Harrie Agent, MD;  Location: [facility];  Service: Ophthalmology;  Laterality: Left;  CDE 9.12   CATARACT EXTRACTION W/PHACO Right 11/03/2021   Procedure: CATARACT EXTRACTION PHACO AND INTRAOCULAR LENS PLACEMENT (IOC);  Surgeon: Harrie Agent, MD;  Location: [facility];  Service: Ophthalmology;  Laterality: Right;  CDE: 6.72   COLONOSCOPY  07/28/04   Dr. Dickie   COLONOSCOPY  2011   Dr. Shaaron: anal canal hemorhoids, pancolonic diverticula, otherwise normal   COLONOSCOPY N/A 03/17/2015   DR. Rourk: diverticulosis. consider one last tcs in 5 years for prior h/o tubular adenoma.   ESOPHAGOGASTRODUODENOSCOPY (EGD) WITH PROPOFOL  N/A 06/06/2017   Dr. Shaaron: Slightly baggy, atonic tubular esophagus which otherwise appeared normal, widely patent.  Dilated for history of dysphagia.  Large hiatal hernia.   HEMORROIDECTOMY     MALONEY DILATION N/A 06/06/2017   Procedure: AGAPITO DILATION;  Surgeon: Shaaron Lamar HERO, MD;  [facility];  Service: Endoscopy;  Laterality: N/A;   TUBAL LIGATION     Patient Active  Problem List   Diagnosis Date Noted   Essential hypertension 09/23/2018   Hiatal hernia 07/18/2017   FHx: esophageal cancer 05/29/2017   Gastroesophageal reflux disease with esophagitis 05/28/2017   Esophageal dysphagia 05/28/2017   Allergic rhinitis due to pollen 11/21/2016   Osteopenia 06/20/2016   Acquired hypothyroidism 06/19/2016   Pure hypercholesterolemia 06/19/2016   Diverticulosis of colon without hemorrhage    History of colonic polyps 03/04/2015    PCP: Trudy Vaughn FALCON, MD   REFERRING PROVIDER:   Trudy Vaughn FALCON, MD    REFERRING DIAG: Weakness of legs   Rationale for Evaluation and Treatment:  Rehabiliation  THERAPY DIAG:  Muscle weakness (generalized)  Difficulty in walking, not elsewhere classified  ONSET DATE: 2 years of inactivity   SUBJECTIVE:  SUBJECTIVE STATEMENT: She reports some soreness in her legs but not bad  PERTINENT HISTORY:  See above PMH  PAIN:  NPRS scale: 5/10 fatigue vs pain Pain location:knees and legs Pain description: intermittent Aggravating factors: activity such as walking up hill Relieving factors: rest   PRECAUTIONS: ,  None  RED FLAGS: None   WEIGHT BEARING RESTRICTIONS:  No  FALLS:  Has patient fallen in last 6 months? No   PLOF:  Independent with basic ADLs  PATIENT GOALS:  Improve activity level and strength  OBJECTIVE:  Note: Objective measures were completed at Evaluation unless otherwise noted.  PATIENT SURVEYS:  Patient-Specific Activity Scoring Scheme  0 represents "unable to perform." 10 represents "able to perform at prior level. 0 1 2 3 4 5 6 7 8 9  10 (Date and Score)   Activity Eval     1. Walking up hill  5    2. Getting up from the floor  3    3.     4.    5.    Score 4/10    Total  score = sum of the activity scores/number of activities Minimum detectable change (90%CI) for average score = 2 points Minimum detectable change (90%CI) for single activity score = 3 points   GAIT: Assistive device utilized: None Level of assistance: Complete Independence Comments: limited distances due to fatigue    LOWER EXTREMITY ROM:   WFL grossly bilat     LOWER EXTREMITY MMT:    MMT in sitting Right eval Left eval  Hip flexion 4 3+  Hip extension    Hip abduction 4 4  Hip adduction 4 4  Hip internal rotation    Hip external rotation    Knee flexion 4 4  Knee extension 4 4  Ankle dorsiflexion    Ankle plantarflexion    Ankle inversion    Ankle eversion     (Blank rows = not tested)  FUNCTIONAL TESTS:  Eval: 30 seconds chair stand test: 11 reps without UE support (Age related norm is 10-15 reps)                                                                                                                               TREATMENT DATE:  Therex Nu step L5-4 X 8 min UE/LE Seated SLR 2X10 bilat Theractivity (strength for ADL's) Leg press machine 10# 2X10 Leg extension machine 10# 2X10 Leg curl machine 10# 2X10 Mini Squat with Counter Support  10 reps Standing Alternating Partial Lunge  10 reps  Eval HEP creation and review with demonstration and trial set preformed, see below for details Selfcare:endurance program of HEP and walking program. Benefits of joining gym and what [employer] has to offer in terms of coaching, program design, group exercise classes and exercise specialists that are available.    PATIENT EDUCATION: Education details: HEP, PT plan of care, selfcare Person educated: Patient Education method: Explanation, Demonstration, Verbal cues, and  Handouts Education comprehension: verbalized understanding, further education recommended   HOME EXERCISE PROGRAM: Access Code: [email or web address] URL: [email or web address] Date:  11/04/2023 Prepared by: Redell Moose  Exercises - Mini Squat with Counter Support  - 1 x daily - 6 x weekly - 2 sets - 10 reps - Seated Straight Leg Raise with Quad Contraction  - 1 x daily - 6 x weekly - 3 sets - 10 reps - Standing Alternating Partial Lunge  - 1 x daily - 6 x weekly - 2 sets - 10 reps  ASSESSMENT:  CLINICAL IMPRESSION: HEP reviewed and she shows good return demo after education provided. I showed her additional strength machines as well for leg strength and encourage her to join gym at end of PT  OBJECTIVE IMPAIRMENTS: decreased activity tolerance for ADL's, difficulty walking, decreased balance, decreased endurance, decreased mobility, decreased ROM, decreased strength, impaired flexibility, impaired LE use, and pain.  ACTIVITY LIMITATIONS: bending, liftting, walking, standing, cleaning, community activity,   PERSONAL FACTORS: see above PMH  also affecting patient's functional outcome.  REHAB POTENTIAL: Good  CLINICAL DECISION MAKING: Stable/uncomplicated  EVALUATION COMPLEXITY: Low    GOALS: Short term PT Goals Target date: 12/02/2023   Pt will be I and compliant with HEP. Baseline:  Goal status: New Pt will improve 30 second chair test time to at least 14 reps Baseline: Goal status: New  Long term PT goals Target date:12/16/2023   Pt will improve 30 second chair stand test to at least 16 reps Baseline: Goal status: New Pt will improve  strength to at least 4+/5 MMT to improve functional strength Baseline: Goal status: New Pt will improve Patient specific functional scale (PSFS) to at least 6/10 to show improved function level Baseline: Goal status: New Pt will be able to ambulate community distances at least 500 ft WNL gait pattern without complaints Baseline: Goal status: New  PLAN: PT FREQUENCY: 1-2 times per week   PT DURATION: 6- weeks  PLANNED INTERVENTIONS  97110-Therapeutic exercises, 97530- Therapeutic activity, [id number]- Neuromuscular  re-education, 97535- Self Care, 02859- Manual therapy, and 97116- Gait training  PLAN FOR NEXT SESSION: leg strength and endurance focus  Redell JONELLE Moose, PT,DPT 11/06/2023, 8:00 AM

## 2023-11-11 ENCOUNTER — Encounter: Payer: Self-pay | Admitting: Physical Therapy

## 2023-11-11 ENCOUNTER — Ambulatory Visit: Admitting: Physical Therapy

## 2023-11-11 DIAGNOSIS — M6281 Muscle weakness (generalized): Secondary | ICD-10-CM

## 2023-11-11 DIAGNOSIS — R262 Difficulty in walking, not elsewhere classified: Secondary | ICD-10-CM

## 2023-11-11 NOTE — Therapy (Signed)
 OUTPATIENT PHYSICAL THERAPY EVALUATION   Patient Name: GERIANN LAFONT MRN: [id number] DOB:Nov 03, 1945, 78 y.o., female Today's Date: 11/11/2023  END OF SESSION:  PT End of Session - 11/11/23 0759     Visit Number 3    Number of Visits 6    Date for PT Re-Evaluation 12/16/23    Authorization Type MCR    Progress Note Due on Visit 10    PT Start Time 0757    PT Stop Time 0835    PT Time Calculation (min) 38 min    Activity Tolerance Patient tolerated treatment well    Behavior During Therapy [facility] for tasks assessed/performed          Past Medical History:  Diagnosis Date   GERD (gastroesophageal reflux disease)    Hypercholesteremia    Hypertension    Hypothyroidism    Thyroid  disease    Past Surgical History:  Procedure Laterality Date   CATARACT EXTRACTION W/PHACO Left 10/20/2021   Procedure: CATARACT EXTRACTION PHACO AND INTRAOCULAR LENS PLACEMENT (IOC);  Surgeon: Harrie Agent, MD;  Location: [facility];  Service: Ophthalmology;  Laterality: Left;  CDE 9.12   CATARACT EXTRACTION W/PHACO Right 11/03/2021   Procedure: CATARACT EXTRACTION PHACO AND INTRAOCULAR LENS PLACEMENT (IOC);  Surgeon: Harrie Agent, MD;  Location: [facility];  Service: Ophthalmology;  Laterality: Right;  CDE: 6.72   COLONOSCOPY  07/28/04   Dr. Dickie   COLONOSCOPY  2011   Dr. Shaaron: anal canal hemorhoids, pancolonic diverticula, otherwise normal   COLONOSCOPY N/A 03/17/2015   DR. Rourk: diverticulosis. consider one last tcs in 5 years for prior h/o tubular adenoma.   ESOPHAGOGASTRODUODENOSCOPY (EGD) WITH PROPOFOL  N/A 06/06/2017   Dr. Shaaron: Slightly baggy, atonic tubular esophagus which otherwise appeared normal, widely patent.  Dilated for history of dysphagia.  Large hiatal hernia.   HEMORROIDECTOMY     MALONEY DILATION N/A 06/06/2017   Procedure: AGAPITO DILATION;  Surgeon: Shaaron Lamar HERO, MD;  [facility];  Service: Endoscopy;  Laterality: N/A;   TUBAL LIGATION     Patient Active  Problem List   Diagnosis Date Noted   Essential hypertension 09/23/2018   Hiatal hernia 07/18/2017   FHx: esophageal cancer 05/29/2017   Gastroesophageal reflux disease with esophagitis 05/28/2017   Esophageal dysphagia 05/28/2017   Allergic rhinitis due to pollen 11/21/2016   Osteopenia 06/20/2016   Acquired hypothyroidism 06/19/2016   Pure hypercholesterolemia 06/19/2016   Diverticulosis of colon without hemorrhage    History of colonic polyps 03/04/2015    PCP: Trudy Vaughn FALCON, MD   REFERRING PROVIDER:   Trudy Vaughn FALCON, MD    REFERRING DIAG: Weakness of legs   Rationale for Evaluation and Treatment:  Rehabiliation  THERAPY DIAG:  Muscle weakness (generalized)  Difficulty in walking, not elsewhere classified  ONSET DATE: 2 years of inactivity   SUBJECTIVE:  SUBJECTIVE STATEMENT: She feels ready to discharge to independent program  PERTINENT HISTORY:  See above PMH  PAIN:  NPRS scale: no leg pain reported Pain location:knees and legs Pain description: intermittent Aggravating factors: activity such as walking up hill Relieving factors: rest   PRECAUTIONS: ,  None  RED FLAGS: None   WEIGHT BEARING RESTRICTIONS:  No  FALLS:  Has patient fallen in last 6 months? No   PLOF:  Independent with basic ADLs  PATIENT GOALS:  Improve activity level and strength  OBJECTIVE:  Note: Objective measures were completed at Evaluation unless otherwise noted.  PATIENT SURVEYS:  Patient-Specific Activity Scoring Scheme  0 represents "unable to perform." 10 represents "able to perform at prior level. 0 1 2 3 4 5 6 7 8 9  10 (Date and Score)   Activity Eval  11/11/23   1. Walking up hill  5  7  2. Getting up from the floor  3  7  3.     4.    5.    Score 4/10  7/10   Total score = sum of the activity scores/number of activities Minimum detectable change (90%CI) for average score = 2 points Minimum detectable change (90%CI) for single activity score = 3 points   GAIT: Assistive device utilized: None Level of assistance: Complete Independence Comments: limited distances due to fatigue    LOWER EXTREMITY ROM:   WFL grossly bilat     LOWER EXTREMITY MMT:    MMT in sitting Right eval Left eval Right/Left 11/11/23  Hip flexion 4 3+ 4+/4+  Hip extension     Hip abduction 4 4 4+/4+  Hip adduction 4 4 4+/4+  Hip internal rotation     Hip external rotation     Knee flexion 4 4 4+/4+  Knee extension 4 4 4+/4+  Ankle dorsiflexion     Ankle plantarflexion     Ankle inversion     Ankle eversion      (Blank rows = not tested)  FUNCTIONAL TESTS:  Eval: 30 seconds chair stand test: 11 reps without UE support (Age related norm is 10-15 reps)  11/11/23 30 second chair stand test 15 reps                                                                                                                              TREATMENT DATE:  11/11/23 Therex Nu step L5-4 X 8 min UE/LE Updated tests and measurments Theractivity (strength for ADL's) Leg press machine 10# 2X10 Leg extension machine 10# 2X10 Leg curl machine 10# 2X10 Gym equipment review she can use post discharge to continue to build her strength and endruance  Eval HEP creation and review with demonstration and trial set preformed, see below for details Selfcare:endurance program of HEP and walking program. Benefits of joining gym and what [employer] has to offer in terms of coaching, program design, group exercise classes and exercise specialists that are available.  PATIENT EDUCATION: Education details: HEP, PT plan of care, selfcare Person educated: Patient Education method: Explanation, Demonstration, Verbal cues, and Handouts Education comprehension: verbalized  understanding, further education recommended   HOME EXERCISE PROGRAM: Access Code: [email or web address] URL: [email or web address] Date: 11/04/2023 Prepared by: Redell Moose  Exercises - Mini Squat with Counter Support  - 1 x daily - 6 x weekly - 2 sets - 10 reps - Seated Straight Leg Raise with Quad Contraction  - 1 x daily - 6 x weekly - 3 sets - 10 reps - Standing Alternating Partial Lunge  - 1 x daily - 6 x weekly - 2 sets - 10 reps  ASSESSMENT:  CLINICAL IMPRESSION: She has met most of her PT goals. She feels ready to discharge to independent program today.   OBJECTIVE IMPAIRMENTS: decreased activity tolerance for ADL's, difficulty walking, decreased balance, decreased endurance, decreased mobility, decreased ROM, decreased strength, impaired flexibility, impaired LE use, and pain.  ACTIVITY LIMITATIONS: bending, liftting, walking, standing, cleaning, community activity,   PERSONAL FACTORS: see above PMH  also affecting patient's functional outcome.  REHAB POTENTIAL: Good  CLINICAL DECISION MAKING: Stable/uncomplicated  EVALUATION COMPLEXITY: Low    GOALS: Short term PT Goals Target date: 12/02/2023   Pt will be I and compliant with HEP. Baseline:  Goal status: MET 11/11/23 Pt will improve 30 second chair test time to at least 14 reps Baseline: Goal status: MET 11/11/23  Long term PT goals Target date:12/16/2023   Pt will improve 30 second chair stand test to at least 16 reps Baseline: Goal status: amost met, had 15 reps 11/11/23 Pt will improve  strength to at least 4+/5 MMT to improve functional strength Baseline: Goal status: MET 11/11/23 Pt will improve Patient specific functional scale (PSFS) to at least 6/10 to show improved function level Baseline: Goal status: MET 11/11/23 Pt will be able to ambulate community distances at least 500 ft WNL gait pattern without complaints Baseline: Goal status: MET 11/11/23  PLAN: PT FREQUENCY: 1-2 times per week    PT DURATION: 6- weeks  PLANNED INTERVENTIONS  97110-Therapeutic exercises, 97530- Therapeutic activity, [id number]- Neuromuscular re-education, 97535- Self Care, 02859- Manual therapy, and 97116- Gait training  PLAN FOR NEXT SESSION: DC today  Redell JONELLE Moose, PT,DPT 11/11/2023, 8:00 AM

## 2023-11-27 ENCOUNTER — Ambulatory Visit (INDEPENDENT_AMBULATORY_CARE_PROVIDER_SITE_OTHER): Payer: Medicare HMO | Admitting: Otolaryngology

## 2023-11-27 ENCOUNTER — Encounter (INDEPENDENT_AMBULATORY_CARE_PROVIDER_SITE_OTHER): Payer: Self-pay | Admitting: Otolaryngology

## 2023-11-27 VITALS — BP 136/81 | HR 70

## 2023-11-27 DIAGNOSIS — H7292 Unspecified perforation of tympanic membrane, left ear: Secondary | ICD-10-CM | POA: Diagnosis not present

## 2023-11-27 DIAGNOSIS — H903 Sensorineural hearing loss, bilateral: Secondary | ICD-10-CM | POA: Diagnosis not present

## 2023-11-27 DIAGNOSIS — H6123 Impacted cerumen, bilateral: Secondary | ICD-10-CM | POA: Diagnosis not present

## 2023-11-27 DIAGNOSIS — H7202 Central perforation of tympanic membrane, left ear: Secondary | ICD-10-CM

## 2023-11-27 NOTE — Progress Notes (Unsigned)
 Patient ID: Tabitha Fields, female   DOB: Aug 26, 1945, 78 y.o.   MRN: [id number]  Follow-up: Left tympanic membrane perforation, hearing loss  HPI: The patient is a 78 year old female who returns today for her follow-up evaluation.  She was previously seen for left ear infection, left tympanic membrane perforation, and hearing loss.  At her last visit in May 2024, her acute infection had resolved.  A small pinpoint left tympanic membrane perforation was noted.  The patient was instructed to observe dry ear precautions.  The patient returns today reporting improvement in her hearing.  She has not noted any recent otitis media or otitis externa.  Currently she denies any otalgia, otorrhea, or vertigo.   Objective Objective note General: Communicates without difficulty, well nourished, no acute distress. Head: Normocephalic, no evidence injury, no tenderness, facial buttresses intact without stepoff. Face/sinus: No tenderness to palpation and percussion. Facial movement is normal and symmetric. Eyes: PERRL, EOMI. No scleral icterus, conjunctivae clear. Neuro: CN II exam reveals vision grossly intact.  No nystagmus at any point of gaze. Ears: Auricles well formed without lesions.  EAC: Bilateral cerumen impaction.  Under the operating microscope, the cerumen is carefully removed with a combination of cerumen currette, alligator forceps, and suction catheters.  After the cerumen is removed, a small pinpoint left tympanic membrane perforation is noted.  No drainage is noted.  Nose: External evaluation reveals normal support and skin without lesions.  Dorsum is intact.  Anterior rhinoscopy reveals congested mucosa over anterior aspect of inferior turbinates and intact septum.  No purulence noted. Oral:  Oral cavity and oropharynx are intact, symmetric, without erythema or edema.  Mucosa is moist without lesions. Neck: Full range of motion without pain.  There is no significant lymphadenopathy.  No masses  palpable.  Thyroid  bed within normal limits to palpation.  Parotid glands and submandibular glands equal bilaterally without mass.  Trachea is midline. Neuro:  CN 2-12 grossly intact.     AUDIOMETRIC TESTING: I have read and reviewed the audiometric test, which shows bilateral high-frequency sensorineural hearing loss. The speech reception threshold is 25dB AD and 20dB AS. The discrimination score is 100% AD and 100% AS. The tympanogram is normal on the right.    Observations Functional status No functional status recorded  Cognitive status No cognitive status recorded  Assessment Assessment note 1.  A small pinpoint left tympanic membrane perforation is noted.  2.  The right tympanic membrane and middle ear space are normal.  3.  No infection is noted today.  4.  Bilateral high-frequency sensorineural hearing loss.  No conductive hearing loss is noted today.   Screenings/Interventions/Assessments No screenings/interventions/assessments recorded  Diagnoses attached to encounter No diagnoses attached  Plan Plan note 1.  The physical exam findings and the hearing test results are reviewed with the patient.  2.  Continue to observe dry ear precaution on the left side.  3.  The patient will return for reevaluation in 1 year.

## 2023-11-28 DIAGNOSIS — H6123 Impacted cerumen, bilateral: Secondary | ICD-10-CM | POA: Insufficient documentation

## 2023-11-28 DIAGNOSIS — H7202 Central perforation of tympanic membrane, left ear: Secondary | ICD-10-CM | POA: Insufficient documentation

## 2023-11-28 DIAGNOSIS — H903 Sensorineural hearing loss, bilateral: Secondary | ICD-10-CM | POA: Insufficient documentation

## 2023-12-30 ENCOUNTER — Ambulatory Visit: Admitting: Orthopedic Surgery

## 2024-01-02 ENCOUNTER — Encounter: Payer: Self-pay | Admitting: Internal Medicine

## 2024-01-13 ENCOUNTER — Ambulatory Visit: Admitting: Orthopedic Surgery

## 2024-01-13 ENCOUNTER — Encounter: Payer: Self-pay | Admitting: Orthopedic Surgery

## 2024-01-13 DIAGNOSIS — M12812 Other specific arthropathies, not elsewhere classified, left shoulder: Secondary | ICD-10-CM | POA: Diagnosis not present

## 2024-01-13 DIAGNOSIS — M12811 Other specific arthropathies, not elsewhere classified, right shoulder: Secondary | ICD-10-CM | POA: Diagnosis not present

## 2024-01-13 NOTE — Progress Notes (Signed)
    01/13/2024   Chief Complaint  Patient presents with   Shoulder Pain    Both     Encounter Diagnoses  Name Primary?   Rotator cuff arthropathy of right shoulder Yes   Rotator cuff arthropathy of left shoulder     What pharmacy do you use ? _______[facility] ____________________  DOI/DOS/   Improved

## 2024-01-13 NOTE — Progress Notes (Signed)
 Patient: Tabitha Fields           Date of Birth: Jan 31, 1946           MRN: [id number] Visit Date: 01/13/2024 Requested by: Trudy Vaughn FALCON, MD [address] [place],  [place] [address] PCP: Trudy Vaughn FALCON, MD   Chief Complaint  Patient presents with   Shoulder Pain    Both    Encounter Diagnoses  Name Primary?   Rotator cuff arthropathy of right shoulder Yes   Rotator cuff arthropathy of left shoulder     Plan:  Continue home therapy  Return as needed   Chief Complaint  Patient presents with   Shoulder Pain    Both     78 yo female w B/L RC Arthropathy s/p bilateral injx, and PT   Note improved range of motion and decreased pain; with some limitations in range of motion and certain activities   Shoulder Pain     There is no height or weight on file to calculate BMI.   Problem list, medical hx, medications and allergies reviewed   ROS   No Known Allergies  There were no vitals taken for this visit.   Physical exam: Physical Exam  Right Shoulder Exam   Tenderness  The patient is experiencing no tenderness.  Range of Motion  Forward flexion:  110   Muscle Strength  Supraspinatus: 4/5   Other  Sensation: normal Pulse: present   Left Shoulder Exam   Tenderness  The patient is experiencing no tenderness.   Range of Motion  Forward flexion:  100   Muscle Strength  Supraspinatus: 4/5   Other  Sensation: normal Pulse: present       Assessment and plan:  Encounter Diagnoses  Name Primary?   Rotator cuff arthropathy of right shoulder Yes   Rotator cuff arthropathy of left shoulder

## 2024-01-22 ENCOUNTER — Encounter: Admitting: Orthopedic Surgery

## 2024-02-04 ENCOUNTER — Ambulatory Visit: Admitting: Internal Medicine

## 2024-02-04 ENCOUNTER — Encounter: Payer: Self-pay | Admitting: Internal Medicine

## 2024-02-04 DIAGNOSIS — K449 Diaphragmatic hernia without obstruction or gangrene: Secondary | ICD-10-CM

## 2024-02-04 DIAGNOSIS — R1319 Other dysphagia: Secondary | ICD-10-CM

## 2024-02-04 DIAGNOSIS — K2100 Gastro-esophageal reflux disease with esophagitis, without bleeding: Secondary | ICD-10-CM

## 2024-02-04 MED ORDER — ESOMEPRAZOLE MAGNESIUM 40 MG PO CPDR: 40.0000 mg | DELAYED_RELEASE_CAPSULE | Freq: Two times a day (BID) | ORAL | 3 refills | Status: AC

## 2024-02-04 NOTE — Patient Instructions (Signed)
 It was nice to see you again today!  As discussed, the benefits of taking Nexium  twice a day as needed outweigh any potential risks.  New prescription for Nexium  40 mg orally twice daily (take before lunch and supper-dispense 180 with 3 additional refills)  I also recommend you take MiraLAX 1 capful of powder in 8 ounces of water  at bedtime every night.  The only reason to hold off is if your stools become too loose.  Unless something comes up, we will plan to see you back in 1 year.

## 2024-02-04 NOTE — Progress Notes (Unsigned)
 Gastroenterology Progress Note    Primary Care Physician:  Trudy Vaughn FALCON, MD Primary Gastroenterologist:  Dr. Shaaron  Pre-Procedure History & Physical: HPI:  Tabitha Fields is a 78 y.o. female here for   Follow-up for GERD and constipation.  Doing very well on Nexium  40 mg twice daily.  May get by once daily for  a period of time and may have a flare and then she gets back on the twice a day regimen.  Only time she has any dysphagia is when she tries to eat pork rinds.  Does well at all other times.  Constipation managed well with MiraLAX sometimes skips a day or 2 and then has to get back on it.  Takes it sporadically but does achieve bowel movement 2-3 times a week.  She has no rectal bleeding.  Distant history of colonic adenoma with negative colonoscopy 2019.  No plans for future surveillance.  Prior EGD also demonstrated  a somewhat baggy esophagus.  Empirically dilated.  Past Medical History:  Diagnosis Date   GERD (gastroesophageal reflux disease)    Hypercholesteremia    Hypertension    Hypothyroidism    Thyroid  disease     Past Surgical History:  Procedure Laterality Date   CATARACT EXTRACTION W/PHACO Left 10/20/2021   Procedure: CATARACT EXTRACTION PHACO AND INTRAOCULAR LENS PLACEMENT (IOC);  Surgeon: Harrie Agent, MD;  Location: [facility];  Service: Ophthalmology;  Laterality: Left;  CDE 9.12   CATARACT EXTRACTION W/PHACO Right 11/03/2021   Procedure: CATARACT EXTRACTION PHACO AND INTRAOCULAR LENS PLACEMENT (IOC);  Surgeon: Harrie Agent, MD;  Location: [facility];  Service: Ophthalmology;  Laterality: Right;  CDE: 6.72   COLONOSCOPY  07/28/04   Dr. Dickie   COLONOSCOPY  2011   Dr. Shaaron: anal canal hemorhoids, pancolonic diverticula, otherwise normal   COLONOSCOPY N/A 03/17/2015   DR. Memori Sammon: diverticulosis. consider one last tcs in 5 years for prior h/o tubular adenoma.   ESOPHAGOGASTRODUODENOSCOPY (EGD) WITH PROPOFOL  N/A 06/06/2017   Dr. Shaaron: Slightly baggy,  atonic tubular esophagus which otherwise appeared normal, widely patent.  Dilated for history of dysphagia.  Large hiatal hernia.   HEMORROIDECTOMY     MALONEY DILATION N/A 06/06/2017   Procedure: AGAPITO DILATION;  Surgeon: Shaaron Lamar HERO, MD;  Location: [facility];  Service: Endoscopy;  Laterality: N/A;   TUBAL LIGATION      Prior to Admission medications   Medication Sig Start Date End Date Taking? Authorizing Provider  Ascorbic Acid (VITAMIN C PO) Take 1 tablet by mouth daily.   Yes [provider]  aspirin EC 81 MG tablet Take 81 mg by mouth daily.   Yes [provider]  calcium carbonate 1250 MG capsule Take 1,250 mg by mouth daily.    Yes [provider]  Coenzyme Q10 (CO Q 10) 100 MG CAPS Take 200 mg by mouth daily.   Yes [provider]  esomeprazole  (NEXIUM ) 40 MG capsule TAKE ONE CAPSULE BY MOUTH TWICE DAILY BEFORE meals 06/08/22  Yes Hawks, Christy A, FNP  levothyroxine  (SYNTHROID ) 125 MCG tablet Take 125 mcg by mouth daily. 09/18/23  Yes [provider]  lisinopril  (ZESTRIL ) 10 MG tablet TAKE 1 TABLET BY MOUTH DAILY 06/08/22  Yes Hawks, Bari A, FNP  Multiple Vitamin (MULTIVITAMIN) capsule Take 1 capsule by mouth daily.   Yes [provider]  Omega-3 Fatty Acids (FISH OIL) 1200 MG CAPS Take 2 capsules by mouth daily.    Yes [provider]  simvastatin  (  ZOCOR ) 40 MG tablet TAKE 1 TABLET BY MOUTH EVERY DAY 06/08/22  Yes Hawks, Christy A, FNP  Wheat Dextrin (BENEFIBER PO) Take by mouth daily.   Yes [provider]    Allergies as of 02/04/2024   (No Known Allergies)    Family History  Problem Relation Age of Onset   Diabetes Mother    Heart disease Mother    Cancer Father        Bone   Esophageal cancer Brother 72   Heart disease Brother    Brain cancer Sister    Heart disease Brother    Esophageal cancer Brother    Colon cancer Neg Hx     Social History   Socioeconomic History   Marital status:  Widowed    Spouse name: Not on file   Number of children: 2   Years of education: Not on file   Highest education level: Associate degree: academic program  Occupational History   Occupation: retired  Tobacco Use   Smoking status: Never   Smokeless tobacco: Never  Vaping Use   Vaping status: Never Used  Substance and Sexual Activity   Alcohol use: No    Alcohol/week: 0.0 standard drinks of alcohol   Drug use: No   Sexual activity: Not Currently    Birth control/protection: Post-menopausal  Other Topics Concern   Not on file  Social History Narrative   Lives alone on one level   Has friends and family nearby   Social Drivers of Health   Financial Resource Strain: Low Risk  (05/21/2022)   Overall Financial Resource Strain (CARDIA)    Difficulty of Paying Living Expenses: Not hard at all  Food Insecurity: No Food Insecurity (05/21/2022)   Hunger Vital Sign    Worried About Running Out of Food in the Last Year: Never true    Ran Out of Food in the Last Year: Never true  Transportation Needs: No Transportation Needs (05/21/2022)   PRAPARE - Administrator, Civil Service (Medical): No    Lack of Transportation (Non-Medical): No  Physical Activity: Insufficiently Active (05/21/2022)   Exercise Vital Sign    Days of Exercise per Week: 3 days    Minutes of Exercise per Session: 30 min  Stress: No Stress Concern Present (05/21/2022)   [employer] of Occupational Health - Occupational Stress Questionnaire    Feeling of Stress : Not at all  Social Connections: Socially Isolated (05/21/2022)   Social Connection and Isolation Panel    Frequency of Communication with Friends and Family: More than three times a week    Frequency of Social Gatherings with Friends and Family: More than three times a week    Attends Religious Services: Never    Database Administrator or Organizations: No    Attends Banker Meetings: Never    Marital Status: Widowed  Intimate  Partner Violence: Not At Risk (05/21/2022)   Humiliation, Afraid, Rape, and Kick questionnaire    Fear of Current or Ex-Partner: No    Emotionally Abused: No    Physically Abused: No    Sexually Abused: No    Review of Systems   See HPI, otherwise negative ROS  Physical Exam: BP 136/80 (BP Location: Right Arm, Patient Position: Sitting, Cuff Size: Normal)   Pulse 76   Temp (!) 97.5 F (36.4 C) (Oral)   Ht 5' 2 (1.575 m)   Wt 146 lb 12.8 oz (66.6 kg)   SpO2 100%  BMI 26.85 kg/m  General:   Alert,  Well-developed, well-nourished, pleasant and cooperative in NAD Heart:  Regular rate and rhythm; no murmurs, clicks, rubs,  or gallops. Abdomen: Non-distended, normal bowel sounds.  Soft and nontender without appreciable mass or hepatosplenomegaly.   Impression/Plan:   78 year old lady with GERD well-controlled on once daily to twice daily Nexium .  Takes her evening dose before bedtime.  Takes her first dose today before lunch due to her thyroid  supplement. Overall doing very well on daily to twice daily Nexium .  Not recommend changing her regimen at this time as the benefits outweigh the risk.   Constipation managed with as needed daily use of MiraLAX.  It sounds he would like she would do better with prophylactic every night dosing.  Recommendations: As discussed, the benefits of taking Nexium  twice a day as needed outweigh any potential risks.  New prescription for Nexium  40 mg orally twice daily (take before lunch and supper-dispense 180 with 3 additional refills)  I also recommend you take MiraLAX 1 capful of powder in 8 ounces of water  at bedtime every night.  The only reason to hold off is if your stools become too loose.  Unless something comes up, we will plan to see you back in 1 year.       Notice: This dictation was prepared with Dragon dictation along with smaller phrase technology. Any transcriptional errors that result from this process are unintentional and  may not be corrected upon review.
